# Patient Record
Sex: Female | Born: 1955 | Race: Black or African American | Hispanic: No | Marital: Married | Smoking: Never smoker
Health system: Southern US, Community
[De-identification: ages and names within clinical notes are randomized; demographics above are authoritative.]

## PROBLEM LIST (undated history)

## (undated) DIAGNOSIS — E039 Hypothyroidism, unspecified: Secondary | ICD-10-CM

## (undated) DIAGNOSIS — E785 Hyperlipidemia, unspecified: Secondary | ICD-10-CM

## (undated) DIAGNOSIS — Z8639 Personal history of other endocrine, nutritional and metabolic disease: Secondary | ICD-10-CM

## (undated) DIAGNOSIS — Z923 Personal history of irradiation: Secondary | ICD-10-CM

## (undated) DIAGNOSIS — R51 Headache: Secondary | ICD-10-CM

## (undated) DIAGNOSIS — K219 Gastro-esophageal reflux disease without esophagitis: Secondary | ICD-10-CM

## (undated) DIAGNOSIS — E7800 Pure hypercholesterolemia, unspecified: Secondary | ICD-10-CM

## (undated) DIAGNOSIS — D571 Sickle-cell disease without crisis: Secondary | ICD-10-CM

## (undated) DIAGNOSIS — M1990 Unspecified osteoarthritis, unspecified site: Secondary | ICD-10-CM

## (undated) DIAGNOSIS — T7840XA Allergy, unspecified, initial encounter: Secondary | ICD-10-CM

## (undated) DIAGNOSIS — H409 Unspecified glaucoma: Secondary | ICD-10-CM

## (undated) HISTORY — DX: Sickle-cell disease without crisis: D57.1

## (undated) HISTORY — PX: COLONOSCOPY: SHX174

## (undated) HISTORY — PX: SHOULDER SURGERY: SHX246

## (undated) HISTORY — PX: EYE SURGERY: SHX253

## (undated) HISTORY — PX: ABDOMINAL HYSTERECTOMY: SHX81

## (undated) HISTORY — DX: Allergy, unspecified, initial encounter: T78.40XA

## (undated) HISTORY — DX: Unspecified glaucoma: H40.9

## (undated) HISTORY — DX: Hyperlipidemia, unspecified: E78.5

## (undated) HISTORY — PX: HIP ARTHROPLASTY: SHX981

## (undated) HISTORY — PX: APPENDECTOMY: SHX54

## (undated) HISTORY — PX: OTHER SURGICAL HISTORY: SHX169

## (undated) HISTORY — DX: Unspecified osteoarthritis, unspecified site: M19.90

## (undated) HISTORY — PX: TONSILLECTOMY: SUR1361

## (undated) HISTORY — PX: JOINT REPLACEMENT: SHX530

---

## 1998-04-06 ENCOUNTER — Ambulatory Visit (HOSPITAL_COMMUNITY): Admission: RE | Admit: 1998-04-06 | Discharge: 1998-04-06 | Payer: Self-pay | Admitting: Obstetrics & Gynecology

## 1999-09-20 ENCOUNTER — Other Ambulatory Visit: Admission: RE | Admit: 1999-09-20 | Discharge: 1999-09-20 | Payer: Self-pay | Admitting: Obstetrics and Gynecology

## 1999-10-12 ENCOUNTER — Other Ambulatory Visit: Admission: RE | Admit: 1999-10-12 | Discharge: 1999-10-12 | Payer: Self-pay | Admitting: Obstetrics and Gynecology

## 1999-10-12 ENCOUNTER — Encounter (INDEPENDENT_AMBULATORY_CARE_PROVIDER_SITE_OTHER): Payer: Self-pay

## 1999-11-06 ENCOUNTER — Inpatient Hospital Stay (HOSPITAL_COMMUNITY): Admission: RE | Admit: 1999-11-06 | Discharge: 1999-11-09 | Payer: Self-pay | Admitting: Obstetrics and Gynecology

## 1999-11-06 ENCOUNTER — Encounter (INDEPENDENT_AMBULATORY_CARE_PROVIDER_SITE_OTHER): Payer: Self-pay | Admitting: Specialist

## 2000-07-29 ENCOUNTER — Encounter (INDEPENDENT_AMBULATORY_CARE_PROVIDER_SITE_OTHER): Payer: Self-pay | Admitting: Specialist

## 2000-07-30 ENCOUNTER — Inpatient Hospital Stay (HOSPITAL_COMMUNITY): Admission: EM | Admit: 2000-07-30 | Discharge: 2000-08-01 | Payer: Self-pay | Admitting: Emergency Medicine

## 2000-11-25 ENCOUNTER — Other Ambulatory Visit: Admission: RE | Admit: 2000-11-25 | Discharge: 2000-11-25 | Payer: Self-pay | Admitting: Obstetrics and Gynecology

## 2003-09-30 ENCOUNTER — Other Ambulatory Visit: Admission: RE | Admit: 2003-09-30 | Discharge: 2003-09-30 | Payer: Self-pay | Admitting: Obstetrics and Gynecology

## 2003-10-14 ENCOUNTER — Encounter: Payer: Self-pay | Admitting: Orthopedic Surgery

## 2003-10-19 ENCOUNTER — Inpatient Hospital Stay (HOSPITAL_COMMUNITY): Admission: RE | Admit: 2003-10-19 | Discharge: 2003-10-25 | Payer: Self-pay | Admitting: Orthopedic Surgery

## 2003-10-25 ENCOUNTER — Inpatient Hospital Stay (HOSPITAL_COMMUNITY)
Admission: RE | Admit: 2003-10-25 | Discharge: 2003-10-29 | Payer: Self-pay | Admitting: Physical Medicine & Rehabilitation

## 2003-12-27 ENCOUNTER — Ambulatory Visit (HOSPITAL_COMMUNITY): Admission: RE | Admit: 2003-12-27 | Discharge: 2003-12-27 | Payer: Self-pay | Admitting: Obstetrics and Gynecology

## 2004-01-09 ENCOUNTER — Encounter: Admission: RE | Admit: 2004-01-09 | Discharge: 2004-01-09 | Payer: Self-pay | Admitting: Obstetrics and Gynecology

## 2004-03-11 ENCOUNTER — Ambulatory Visit (HOSPITAL_COMMUNITY): Admission: RE | Admit: 2004-03-11 | Discharge: 2004-03-11 | Payer: Self-pay | Admitting: Emergency Medicine

## 2004-04-10 ENCOUNTER — Inpatient Hospital Stay (HOSPITAL_COMMUNITY): Admission: RE | Admit: 2004-04-10 | Discharge: 2004-04-12 | Payer: Self-pay | Admitting: Obstetrics and Gynecology

## 2004-04-10 ENCOUNTER — Encounter (INDEPENDENT_AMBULATORY_CARE_PROVIDER_SITE_OTHER): Payer: Self-pay | Admitting: Specialist

## 2005-05-06 ENCOUNTER — Encounter (HOSPITAL_COMMUNITY): Admission: RE | Admit: 2005-05-06 | Discharge: 2005-08-04 | Payer: Self-pay | Admitting: Endocrinology

## 2006-02-27 ENCOUNTER — Other Ambulatory Visit: Admission: RE | Admit: 2006-02-27 | Discharge: 2006-02-27 | Payer: Self-pay | Admitting: Obstetrics and Gynecology

## 2006-03-07 ENCOUNTER — Ambulatory Visit (HOSPITAL_COMMUNITY): Admission: RE | Admit: 2006-03-07 | Discharge: 2006-03-07 | Payer: Self-pay | Admitting: Obstetrics and Gynecology

## 2006-03-24 ENCOUNTER — Encounter: Admission: RE | Admit: 2006-03-24 | Discharge: 2006-03-24 | Payer: Self-pay | Admitting: Obstetrics and Gynecology

## 2007-01-23 ENCOUNTER — Ambulatory Visit (HOSPITAL_COMMUNITY): Admission: RE | Admit: 2007-01-23 | Discharge: 2007-01-23 | Payer: Self-pay | Admitting: Orthopedic Surgery

## 2007-03-06 ENCOUNTER — Ambulatory Visit (HOSPITAL_COMMUNITY): Admission: RE | Admit: 2007-03-06 | Discharge: 2007-03-06 | Payer: Self-pay | Admitting: Orthopedic Surgery

## 2008-07-18 ENCOUNTER — Ambulatory Visit (HOSPITAL_BASED_OUTPATIENT_CLINIC_OR_DEPARTMENT_OTHER): Admission: RE | Admit: 2008-07-18 | Discharge: 2008-07-18 | Payer: Self-pay | Admitting: Orthopedic Surgery

## 2010-02-01 ENCOUNTER — Encounter: Payer: Self-pay | Admitting: Sports Medicine

## 2010-03-01 ENCOUNTER — Encounter: Payer: Self-pay | Admitting: Sports Medicine

## 2010-03-02 ENCOUNTER — Ambulatory Visit: Payer: Self-pay | Admitting: Sports Medicine

## 2010-03-02 DIAGNOSIS — G5710 Meralgia paresthetica, unspecified lower limb: Secondary | ICD-10-CM | POA: Insufficient documentation

## 2010-03-02 DIAGNOSIS — M25559 Pain in unspecified hip: Secondary | ICD-10-CM | POA: Insufficient documentation

## 2010-03-05 ENCOUNTER — Encounter: Payer: Self-pay | Admitting: Sports Medicine

## 2010-04-04 ENCOUNTER — Ambulatory Visit: Payer: Self-pay | Admitting: Sports Medicine

## 2010-04-13 ENCOUNTER — Encounter: Admission: RE | Admit: 2010-04-13 | Discharge: 2010-04-13 | Payer: Self-pay | Admitting: Obstetrics and Gynecology

## 2010-05-02 ENCOUNTER — Ambulatory Visit: Payer: Self-pay | Admitting: Sports Medicine

## 2010-05-02 DIAGNOSIS — R269 Unspecified abnormalities of gait and mobility: Secondary | ICD-10-CM | POA: Insufficient documentation

## 2010-05-23 ENCOUNTER — Ambulatory Visit: Payer: Self-pay | Admitting: Family Medicine

## 2010-06-05 ENCOUNTER — Encounter: Admission: RE | Admit: 2010-06-05 | Discharge: 2010-07-18 | Payer: Self-pay | Admitting: Family Medicine

## 2010-06-05 ENCOUNTER — Encounter: Payer: Self-pay | Admitting: Family Medicine

## 2010-07-05 ENCOUNTER — Ambulatory Visit: Payer: Self-pay | Admitting: Sports Medicine

## 2010-07-05 DIAGNOSIS — M545 Low back pain, unspecified: Secondary | ICD-10-CM | POA: Insufficient documentation

## 2010-07-06 ENCOUNTER — Ambulatory Visit (HOSPITAL_COMMUNITY): Admission: RE | Admit: 2010-07-06 | Discharge: 2010-07-06 | Payer: Self-pay | Admitting: Sports Medicine

## 2010-07-18 ENCOUNTER — Encounter: Payer: Self-pay | Admitting: Sports Medicine

## 2010-08-16 ENCOUNTER — Ambulatory Visit: Payer: Self-pay | Admitting: Sports Medicine

## 2010-10-15 ENCOUNTER — Ambulatory Visit: Payer: Self-pay | Admitting: Sports Medicine

## 2010-10-17 ENCOUNTER — Encounter
Admission: RE | Admit: 2010-10-17 | Discharge: 2010-12-22 | Payer: Self-pay | Source: Home / Self Care | Attending: Sports Medicine | Admitting: Sports Medicine

## 2010-10-17 ENCOUNTER — Encounter: Payer: Self-pay | Admitting: Sports Medicine

## 2010-11-02 ENCOUNTER — Encounter: Payer: Self-pay | Admitting: Sports Medicine

## 2010-11-20 ENCOUNTER — Ambulatory Visit: Payer: Self-pay | Admitting: Sports Medicine

## 2010-11-28 ENCOUNTER — Encounter
Admission: RE | Admit: 2010-11-28 | Discharge: 2010-12-22 | Payer: Self-pay | Source: Home / Self Care | Attending: Family Medicine | Admitting: Family Medicine

## 2010-12-22 ENCOUNTER — Encounter: Admission: RE | Admit: 2010-12-22 | Payer: Self-pay | Source: Home / Self Care | Admitting: Family Medicine

## 2010-12-26 ENCOUNTER — Encounter
Admission: RE | Admit: 2010-12-26 | Discharge: 2011-01-01 | Payer: Self-pay | Source: Home / Self Care | Attending: Family Medicine | Admitting: Family Medicine

## 2011-01-07 ENCOUNTER — Encounter: Payer: Self-pay | Admitting: Sports Medicine

## 2011-01-11 ENCOUNTER — Encounter: Payer: Self-pay | Admitting: Sports Medicine

## 2011-01-12 ENCOUNTER — Encounter: Payer: Self-pay | Admitting: Obstetrics and Gynecology

## 2011-01-13 ENCOUNTER — Encounter: Payer: Self-pay | Admitting: Obstetrics and Gynecology

## 2011-01-13 ENCOUNTER — Encounter: Payer: Self-pay | Admitting: Orthopedic Surgery

## 2011-01-17 ENCOUNTER — Ambulatory Visit: Admit: 2011-01-17 | Payer: Self-pay | Admitting: Sports Medicine

## 2011-01-22 NOTE — Assessment & Plan Note (Signed)
Summary: F/U,MC   Vital Signs:  Patient profile:   55 year old female Pulse rate:   71 / minute BP sitting:   119 / 76  (right arm)  Vitals Entered By: Rochele Pages RN (November 20, 2010 11:44 AM) CC: f/u low back pain   CC:  f/u low back pain.  History of Present Illness: 55 yo female here for f/u of back pain Pt states she has had about the same amount of pain as last visit.  Pt states she still has some pain that is worse with standing for >66min and radiates to lateral right thigh.  Does not pass knee, never has given out on her Pt denies any swelling or any bowel or bladder problems.  Pt states same pain seems to be worse with walking too much.  Pt states that the PT she was dioing was helping but pt only had 2 visits but did seem to help.  Pt is continuing HEP.    Pain was improving until long bus trip to [facility] and back  The anterior thigh pain has improved only had a small amount of it with her cooking this weekednd for thanksgiving but neurontin seemed to help and no side effects.   Pt is not taking any other pain meds.   Pt has hx of bilateral hip replacements.   Preventive Screening-Counseling & Management  Alcohol-Tobacco     Smoking Status: never  Current Medications (verified): 1)  Gabapentin 600 Mg Tabs (Gabapentin) .[name].. 1 By Mouth Tid 2)  Simvastatin 40 Mg Tabs (Simvastatin) .[name].. 1 By Mouth Qhs 3)  Synthroid 112 Mcg Tabs (Levothyroxine Sodium) .[name].. 1 By Mouth Qd 4)  Pantoprazole Sodium 40 Mg Tbec (Pantoprazole Sodium) .[name].. 1 By Mouth As Needed 5)  Naproxen 500 Mg Tabs (Naproxen) .[name].. 1 By Mouth Two Times A Day As Directed 6)  Promethazine Hcl 25 Mg Tabs (Promethazine Hcl) .[name].. 1 By Mouth As Needed Nausea 7)  Calcium Carbonate 600 Mg Tabs (Calcium Carbonate) .[name].. 1 By Mouth Bid 8)  Vitamin D 400 Unit Caps (Cholecalciferol) .... Takes Total of 2000 U Qd  Allergies (verified): 1)  ! Ultram  Past History:  Past medical, surgical, family and social histories  (including risk factors) reviewed, and no changes noted (except as noted below).  Past Surgical History: Reviewed history from 05/23/2010 and no changes required. 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  Family History: Reviewed history and no changes required.  Social History: Reviewed history and no changes required. Smoking Status:  never  Review of Systems       denies fever, chills, nausea, vomiting, diarrhea or constipation shortness of breath, Chest pain.   Physical Exam  General:  Well-developed,obest female,in no acute distress; alert,appropriate and cooperative throughout examination Msk:  Low back with intact skin, mild tenderness to the palpation of her lumbar spine. Flexion 100  degrees, extension 30 degrees with no tenderness, full ROM for rotation and lateral motion. Strengh V/V for hip, knees, and ankles. DTR II/IV patellar, and aquilles B/L No sensory Deficit. Gait with lateral swing. neg SLT neg fabers weakness of hip abductors on right side  Pulses:  2+   Impression & Recommendations:  Problem # 1:  BACK PAIN, LUMBAR (ICD-724.2) Assessment Unchanged seems to be more a core instability and weak hip abductors.   Gave pt exercises to do has some mild trochanteric bursitis pt can use NSAIDS as needed for Will send again for PT for  4-6 weeks 2x weekly F/u in 4 weeks  Her updated medication list for this problem includes:    Naproxen 500 Mg Tabs (Naproxen) .[name]... 1 by mouth two times a day as directed  Problem # 2:  HIP PAIN, BILATERAL (ICD-719.45) Assessment: Unchanged as above hx of b/l hip replacements.  Her updated medication list for this problem includes:    Naproxen 500 Mg Tabs (Naproxen) .[name]... 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   Complete Medication List: 1)  Gabapentin 600 Mg Tabs  (Gabapentin) .[name].. 1 by mouth tid 2)  Simvastatin 40 Mg Tabs (Simvastatin) .[name].. 1 by mouth qhs 3)  Synthroid 112 Mcg Tabs (Levothyroxine sodium) .[name].. 1 by mouth qd 4)  Pantoprazole Sodium 40 Mg Tbec (Pantoprazole sodium) .[name].. 1 by mouth as needed 5)  Naproxen 500 Mg Tabs (Naproxen) .[name].. 1 by mouth two times a day as directed 6)  Promethazine Hcl 25 Mg Tabs (Promethazine hcl) .[name].. 1 by mouth as needed nausea 7)  Calcium Carbonate 600 Mg Tabs (Calcium carbonate) .[name].. 1 by mouth bid 8)  Vitamin D 400 Unit Caps (Cholecalciferol) .... Takes total of 2000 u qd   Orders Added: 1)  Est. Patient Level III [45409]

## 2011-01-22 NOTE — Assessment & Plan Note (Signed)
Summary: HIP PAIN,MC   Vital Signs:  Patient profile:   55 year old female Height:      66 inches Weight:      238 pounds BMI:     38.55 BP sitting:   158 / 81  Vitals Entered By: Lillia Pauls CMA (March 02, 2010 10:38 AM)  CC:  groin and thigh pain.  History of Present Illness: 1.  bilateral groin and thigh pain--had bilateral hip replacement in 2004 for ?avascular necrosis of hips after what sounds to be a prolonged period of hyperthyroidism.  Started having groin and thigh pain 3 years ago.  Pain is described as very strong charlie horse.  Is excruciating at times.  She has stayed in bed for as long as a week before.  Episodes of pain typically last for about 3 days.  And she typically had 2-3 per month.  About 1 month ago, she was started on baclofen and advil scheduled and given vicodin for breakthrough pain.  Since starting this regimen, the pain is better.  She has only had one episode and it lasted on 1.5 days.  She uses the vicodin infrequently, < once a week.  She has had normal EMGs X 2, a normal hip MRIs and plain films.  She received a cortisone injection of left hip bursa with little relief.  She was recently found to be vitamin D deficient and started on replacement.    Allergies (verified): 1)  ! Ultram  Review of Systems General:  Denies fever and weight loss. MS:  ocassional low back pain.  no true muscle weakness.  no numbness.  does feel off-balance during and after the pain episodes.  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   Start gabapentin and titrate up as described in patient instructions.  Stop baclofen and advil.  Likely will not need the hydrocodone anymore.  Follow up in 4-6 weeks.    Problem # 2:  HIP PAIN, BILATERAL (ICD-719.45) Although she is s/P THR bilaterally the hips seem to function well strength around hip joint is excellent really does not seem that this is the trigger for her pain  Complete Medication List: 1)  Neurontin 300 Mg Caps (Gabapentin) .... Increase to three times a day as directed.  for pain  Patient Instructions: 1)  It was nice to see you today. 2)  The condition you have is meralgia paresthetica. 3)  Avoid tight clothing. 4)  Start taking the medicine we prescribed you:  gabapentin. 5)  Take 1 tablet in the evening for one week.  Then one tablet in the morning and one in the evening for one week.  Then one tablet three times a day. 6)  STOP the baclofen and the advil. 7)  Water aerobics or a recumbent stationary bike would be great exercises. 8)  Please schedule a follow-up appointment in 4-6 weeks. Prescriptions: NEURONTIN 300 MG CAPS (GABAPENTIN) Increase to three times a day as directed.  For pain  #90 x 3   Entered by:   Asher Muir MD   Authorized by:  Enid Baas MD   Signed by:   Asher Muir MD on 03/02/2010   Method used:   Electronically to        [id number] (retail)       [address]       [place], [place]  [address]       Ph: [phone] or [phone]       Fax: [phone]   RxID:   [id number]

## 2011-01-22 NOTE — Assessment & Plan Note (Signed)
Summary: F/U THIGH,MC   Vital Signs:  Patient profile:   55 year old female Height:      66 inches Weight:      251 pounds Pulse rate:   80 / minute BP sitting:   137 / 82  (right arm)  Vitals Entered By: Rochele Pages RN (October 15, 2010 11:18 AM) CC: f/u rt thight cramping- 50 % improved   CC:  f/u rt thight cramping- 50 % improved.  History of Present Illness: Pt presents to clinic today for follow up of rt thigh cramping which she reports is 50% improved since last visit.  She was diagnosed with meralgia and treated with neurontin 1800 mg daily. Her groin pain has improve significantly. She is complaining today  back pain radiated to her leg on and off, burning sensation in the lateral aspect of both knee around the lateral epicondile for the last week, on and off, walking, bending forward and changes of possition makes the tingling worse, resting makes it better. No numbness, no weakness. No saddle anestesia. She has Hx of B/L hip replacement in 2004. Last F/U last year. Her hips are doing well.  Allergies: 1)  ! Ultram  Physical Exam  General:  Well-developed,well-nourished,in no acute distress; alert,appropriate and cooperative throughout examination Msk:  Low back with intact skin, mild tenderness to the palpation of her lumbar spine. Flexion 100  degrees, extension 30 degrees with tenderness, full ROM for rotation and lateral motion. Strengh V/V for hip, knees, and ankles. DTR II/IV patellar, and aquilles B/L No sensory Deficit. Gait with lateral swing.     Impression & Recommendations:  Problem # 1:  BACK PAIN, LUMBAR (ICD-724.2)  Referred to Physical Therapy for back strengtening, and strecht exercise program  Her updated medication list for this problem includes:    Naproxen 500 Mg Tabs (Naproxen) .[name]... 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 1 by mouth tid 2)  Simvastatin 40 Mg Tabs (Simvastatin) .[name].. 1 by mouth qhs 3)  Synthroid 112 Mcg Tabs (Levothyroxine sodium) .[name].. 1 by mouth qd 4)  Pantoprazole Sodium 40 Mg Tbec (Pantoprazole sodium) .[name].. 1 by mouth as needed 5)  Naproxen 500 Mg Tabs (Naproxen) .[name].. 1 by mouth two times a day as directed 6)  Promethazine Hcl 25 Mg Tabs (Promethazine hcl) .[name].. 1 by mouth as needed nausea 7)  Calcium Carbonate 600 Mg Tabs (Calcium carbonate) .[name].. 1 by mouth bid 8)  Vitamin D 400 Unit Caps (Cholecalciferol) .... Takes total of 2000 u qd   Orders Added: 1)  Est. Patient Level III [16109]     PT referral info sent to [facility] on [address] Rochele Pages RN  October 15, 2010 2:42 PM

## 2011-01-22 NOTE — Assessment & Plan Note (Signed)
Summary: R CRAMPING IN GROIN   Vital Signs:  Patient profile:   55 year old female BP sitting:   145 / 74  Vitals Entered By: Lillia Pauls CMA (May 23, 2010 2:28 PM)  History of Present Illness: Reports to f/u meralgia paresthetica. Symptoms initially improved, but worsened after initiation of zumba classes about 1 week ago. No zumba in the interim. Pain resolving. No change in pain distribution. No back pain, anesthesia, or incontinence.  Hx of bilateral THA performed by Dr. Turner Daniels several years ago. Last seen by Dr. Turner Daniels a few months ago. X-rays and clinical exam suggested normal alignment and placement of surgical hardware without signs of loosening. Instructed to f/u with Dr. Turner Daniels in 1 year.  Allergies: 1)  ! 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  Physical Exam  General:  Well-developed,well-nourished,in no acute distress; alert,appropriate and cooperative throughout examination Msk:  HIPS: 4/5 strength w/easy fatiguability throughout ROM testing. No ttp of hips or groin.  No pain on IR/ER. (-) log roll bilaterally. (-) foot tap bilaterally.  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  -Restart physical therapy given history of bilateral THA for address osteopnenia 2/2 hyperthyroidism. -Will not change current neurontin regimen. -Avoid intense circuit workouts and activities such as zumba -focus more on recumbent biking and waling on track as tolerated. -rtc in 4 wks.  Complete Medication List: 1)  Gabapentin 600 Mg Tabs (Gabapentin) .[name].. 1 by mouth tid

## 2011-01-22 NOTE — Assessment & Plan Note (Signed)
Summary: F/U,MC   Vital Signs:  Patient profile:   55 year old female BP sitting:   133 / 80  Vitals Entered By: Lillia Pauls CMA (April 04, 2010 11:40 AM)  History of Present Illness: Anterior Hip, groin pain and thigh cramps are definitely less describes 50% reduction flare on 4 hour car trip some flares if sitting too much no severe bouts since last seen Before gabapentin would have 3 to 4 severe bouts per month - sometimes would put her on crutches  gabapentin side effects - fell one time with left leg too numb not too drwsy  Allergies: 1)  ! 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 1 by mouth tid  Patient Instructions: 1)  week 1  2)  gabapentin 300 AM/ 300 noon and 600 at night 3)  week 2    600/ 300/ 600 4)  weeks 3 and 4 600 three times a day 5)  if side effects just stay at prior dose and let us know 6)  reck here in 4 weeks 7)  monitor your pain and amount of cramping Prescriptions: GABAPENTIN 600 MG TABS (GABAPENTIN) 1 by mouth tid  #90 x 2   Entered and Authorized by:   Enid Baas MD   Signed by:   Enid Baas MD on 04/04/2010   Method used:   Electronically to        [id number] ([facility]       [address]       [place], [place]  [address]       Ph: [phone] or [phone]       Fax: [phone]   RxID:   [id number]

## 2011-01-22 NOTE — Letter (Signed)
Summary: *Consult Note  [facility]  [address]   [place], [place] [address]   Phone: [phone]  Fax: [phone]    Re:    Veronica Martinez DOB:    Oct 09, 1956 Nadyne Coombes, MD [facility] 03/03/10  Dear [name]:    I had the pleasure of seeing your patient for consultation.  A copy of the detailed office note will be sent under separate cover, for your review.  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  For pain   Thank you for this consultation.  If you have any further questions regarding the care of this patient, please do not hesitate to contact me @ [phone].  Thank you for this opportunity to look after your patient.  Sincerely,  Vincent Gros MD

## 2011-01-22 NOTE — Progress Notes (Signed)
Summary: [facility] PT referral  [facility] referral   Imported By: Marily Memos 11/20/2010 13:36:31  _____________________________________________________________________  External Attachment:    Type:   Image     Comment:   External Document

## 2011-01-22 NOTE — Letter (Signed)
Summary: [facility]  [facility]   Imported By: Knox Royalty 04/11/2010 11:37:32  _____________________________________________________________________  External Attachment:    Type:   Image     Comment:   External Document

## 2011-01-22 NOTE — Miscellaneous (Signed)
Summary: [facility]   Imported By: Marily Memos 03/20/2010 16:13:50  _____________________________________________________________________  External Attachment:    Type:   Image     Comment:   External Document

## 2011-01-22 NOTE — Assessment & Plan Note (Signed)
Summary: FU THIGH PAIN/MJD   Vital Signs:  Patient profile:   55 year old female BP sitting:   108 / 69  Vitals Entered By: Veronica Martinez CMA (August 16, 2010 1:43 PM)  History of Present Illness: Veronica Martinez is a 55 year old female who presents today in follow up for right thigh cramping and a recent exacerbation of her meralgia parasthetica. She reports that she is doing qutie well with both problems. She has not had a thigh cramp in over a month. She has been drinking more water than usual, eating bananans and drinking gatorade occasionally (not daily, as directed, because she noted the sodium content). She is also doing some quadricpes strethcing and strengthening exercises, per PT which seems to help as well.  The patient has also noted an improvement in her meralgia parasthetica. Her symptoms tend to come from the low back/SI joint region and wrap around to her anterior thigh, stopping above the knee. She continues to take neurontin 600mg  three times a day and to do her PT exercises at home as she has finished her formal physical therapy. She is also walking 45 minutes twice daily and would like to be able to do some more vigerous exercise as she is trying to lose weight, but also wants to be cautious since her symptoms were aggrivated when she started zumba.  Allergies: 1)  ! Ultram  Physical Exam  General:  Well-developed,obest female,in no acute distress; alert,appropriate and cooperative throughout examination Msk:  Back: Normal range of motion with some limitation in extension, but no pain. No tenderness to palpation of spine, paraspinal muscles, SI area. Negative log roll bilaterally straight leg raise. Negative FABER, but some tightness on the right.   5/5 hip flexion and ABduction, knee flexion and extension  Hip rotation is 60 degrees (30deg internal rotation, 30 deg external rotation) bilaterally. Neurologic:  Patellar reflexes 1+ and symmetrical. Sensation to light touch  intact throughout both legs.   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 She will continue this regimen and will look for an aerobic exercise class that does not involve significant hip flexion  to avoid exacerbating this problem while allowing her to exercise and try to lose weight. She is quite strong in the hip, so is not likely to be limited by weakness. She is to return to clinic in three months, sooner if needed, to ensure that she has found something that works for her.  Problem # 2:  ABNORMALITY OF GAIT (ICD-781.2) Veronica Martinez has done well with the medial heel wedges in her athletic shoes to prevent her excessive pronation. We will add some of these to her dress shoes to provide additional comfort when she cannot wear the athletic shoes.  Complete Medication List: 1)  Gabapentin 600 Mg Tabs (Gabapentin) .[name].. 1 by mouth tid 2)  Simvastatin 40 Mg Tabs (Simvastatin) .[name].. 1 by mouth qhs 3)  Synthroid 112 Mcg Tabs (Levothyroxine sodium) .[name].. 1 by mouth qd 4)  Pantoprazole Sodium 40 Mg Tbec (Pantoprazole sodium) .[name].. 1 by mouth as needed 5)  Naproxen 500 Mg Tabs (Naproxen) .[name].. 1 by mouth two times a day as directed 6)  Promethazine Hcl 25 Mg Tabs (Promethazine hcl) .[name].. 1 by mouth as needed nausea 7)  Calcium Carbonate 600 Mg Tabs (Calcium carbonate) .[name].. 1 by mouth bid 8)  Vitamin D 400 Unit  Caps (Cholecalciferol) .... Takes total of 2000 u qd

## 2011-01-22 NOTE — Consult Note (Signed)
[facility]   Imported By: Abundio Miu 11/29/2010 09:57:41  _____________________________________________________________________  External Attachment:    Type:   Image     Comment:   External Document

## 2011-01-22 NOTE — Miscellaneous (Signed)
Summary: [facility]   Imported By: Marily Memos 04/05/2010 10:32:40  _____________________________________________________________________  External Attachment:    Type:   Image     Comment:   External Document

## 2011-01-22 NOTE — Assessment & Plan Note (Signed)
Summary: FU THIGH CRAMPS/MJD   Vital Signs:  Patient profile:   55 year old female BP sitting:   123 / 75  Vitals Entered By: Lillia Pauls CMA (July 05, 2010 10:34 AM)  History of Present Illness: pt here today to f/u right hip pain which she says is getting better with PT.  has 2 more sessions left with PT.  she sts she is still having some cramping that radiates down to her knee.   While pattern in past was more ant thigh and groin now some pain starts at [facility] Joint area radiates into buttocks and thigh tingling sometimes to knee has soem thigh cramps admits to sweating more in heat  worse if she is too active often has LBP if she does too much  Allergies: 1)  ! Ultram  Physical Exam  General:  Well-developed,well-nourished,in no acute distress; alert,appropriate and cooperative throughout examination Msk:  Hip ROM is normal bilat on RT she feels some tightness  SLR on RT causes some pain into buttocks at 30 deg SLR on left is neg TTP over RT SIJ Tight over dep buottocks   Impression & Recommendations:  Problem # 1:  BACK PAIN, LUMBAR (ICD-724.2)  Her updated medication list for this problem includes:    Naproxen 500 Mg Tabs (Naproxen) .[name]... 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 whether Zumba class caused this to flare or whether new sxs are referred f back  keep some motion exercises for back and hips  Complete Medication List: 1)  Gabapentin 600 Mg Tabs (Gabapentin) .[name].. 1 by mouth tid 2)  Simvastatin 40 Mg Tabs (Simvastatin) .[name].. 1 by mouth qhs 3)  Synthroid 112 Mcg Tabs (Levothyroxine sodium) .[name].. 1 by mouth qd 4)  Pantoprazole Sodium 40 Mg Tbec (Pantoprazole sodium) .[name].. 1  by mouth as needed 5)  Naproxen 500 Mg Tabs (Naproxen) .[name].. 1 by mouth two times a day as directed 6)  Promethazine Hcl 25 Mg Tabs (Promethazine hcl) .[name].. 1 by mouth as needed nausea 7)  Calcium Carbonate 600 Mg Tabs (Calcium carbonate) .[name].. 1 by mouth bid 8)  Vitamin D 400 Unit Caps (Cholecalciferol) .... Takes total of 2000 u qd  Patient Instructions: 1)  drink 1 gatorade regular bottle per day 2)  take 1 naprosyn 500 per day and take 2 if back is too painful 3)  use pantorparazole or something like zantac or tagamet if stomach irritation 4)  let's xray your low back 5)  keep up gabapentin at 600 three times a day 6)  call you w xray results

## 2011-01-22 NOTE — Miscellaneous (Signed)
Summary: Rehab Report  Rehab Report   Imported By: Marily Memos 06/13/2010 10:57:54  _____________________________________________________________________  External Attachment:    Type:   Image     Comment:   External Document

## 2011-01-22 NOTE — Assessment & Plan Note (Signed)
Summary: FU HIP PAIN/MJD   History of Present Illness: Really had sone well with no cramps on 600 three times a day of gabapentin for 2 wks Then wore very high heels for most of one day had some calf pain that night cramps and tingling in both ant thighs but worse on RT  no real cramps since then but stil some tingling on RT also more sensitive to touch upper rt thigh  Allergies: 1)  ! Ultram  Physical Exam  General:  obese,well-nourished,in no acute distress; alert,appropriate and cooperative throughout examination Msk:  Full ROM both hips good hip abduction, flex and rotation strength RT and LT however, now hip flexion cause some tightness in Ant RT thigh + tinels w pressur over femoral triangle on RT/ not on left tingling over adductors of rt thigh to palpation  walking gait is abnormal w genu valgus that increase dynamically has some loss of long arch bilat   Impression & Recommendations:  Problem # 1:  HIP PAIN, BILATERAL (ICD-719.45) generally much better until last week  would cont gabapentin at 600 three times a day x 3 mos  Problem # 2:  MERALGIA PARESTHETICA (ICD-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 1 by mouth tid

## 2011-01-22 NOTE — Miscellaneous (Signed)
Summary: [facility]  [facility]   Imported By: Marily Memos 10/19/2010 09:06:33  _____________________________________________________________________  External Attachment:    Type:   Image     Comment:   External Document

## 2011-01-22 NOTE — Letter (Signed)
Summary: [facility] PT Referral form  [facility] PT Referral form   Imported By: Marily Memos 05/23/2010 16:15:42  _____________________________________________________________________  External Attachment:    Type:   Image     Comment:   External Document

## 2011-01-22 NOTE — Miscellaneous (Signed)
[facility]   Imported By: Marily Memos 11/07/2010 09:27:32  _____________________________________________________________________  External Attachment:    Type:   Image     Comment:   External Document

## 2011-01-22 NOTE — Miscellaneous (Signed)
Summary: [facility]  [facility]   Imported By: Marily Memos 07/24/2010 13:29:56  _____________________________________________________________________  External Attachment:    Type:   Image     Comment:   External Document

## 2011-01-24 NOTE — Letter (Signed)
Summary: [facility]   Imported By: Marily Memos 01/14/2011 15:07:28  _____________________________________________________________________  External Attachment:    Type:   Image     Comment:   External Document

## 2011-01-24 NOTE — Miscellaneous (Signed)
[facility]  [facility]   Imported By: Marily Memos 01/08/2011 09:48:58  _____________________________________________________________________  External Attachment:    Type:   Image     Comment:   External Document

## 2011-02-11 ENCOUNTER — Ambulatory Visit: Payer: Self-pay | Admitting: Sports Medicine

## 2011-02-20 ENCOUNTER — Encounter: Payer: Self-pay | Admitting: Sports Medicine

## 2011-02-20 ENCOUNTER — Ambulatory Visit (INDEPENDENT_AMBULATORY_CARE_PROVIDER_SITE_OTHER): Payer: Medicare Other | Admitting: Sports Medicine

## 2011-02-20 DIAGNOSIS — E669 Obesity, unspecified: Secondary | ICD-10-CM

## 2011-02-20 DIAGNOSIS — G5710 Meralgia paresthetica, unspecified lower limb: Secondary | ICD-10-CM

## 2011-02-20 DIAGNOSIS — M545 Low back pain, unspecified: Secondary | ICD-10-CM

## 2011-02-20 DIAGNOSIS — M25559 Pain in unspecified hip: Secondary | ICD-10-CM

## 2011-02-28 NOTE — Assessment & Plan Note (Signed)
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  Gabapentin - helps relieve pain/cramping -Cramps are better  Tight clothing, sitting in uncomfortable chair makes worse  TENS unit for back bain and PT have helped  Back pain: -15 years ago - worked in [employer] -Injury - pulled muscle in back -Lubar pain persists -Numbness in low back  I wonder if this is triggerd in part 2/2 to changes in stance and gait from hip replacements  Allergies: 1)  ! 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 1 by mouth two times a day as directed  This is also improved with PT exercises  Problem # 3:  BACK PAIN, LUMBAR (ICD-724.2)  Her updated medication list for this problem includes:    Naproxen 500 Mg Tabs (Naproxen) .[name]... 1 by mouth two times a day as directed  wonder if this is more SI joint  cont TENS unit as this has helped  will cosider injection if worse  Problem # 4:  OBESITY, UNSPECIFIED  (ICD-278.00) clearly a factor in back and mer paresth  will cont to push aerobic exercise and attempts at weight loss  Complete Medication List: 1)  Gabapentin 600 Mg Tabs (Gabapentin) .[name].. 1 by mouth tid 2)  Simvastatin 40 Mg Tabs (Simvastatin) .[name].. 1 by mouth qhs 3)  Synthroid 112 Mcg Tabs (Levothyroxine sodium) .[name].. 1 by mouth qd 4)  Pantoprazole Sodium 40 Mg Tbec (Pantoprazole sodium) .[name].. 1 by mouth as needed 5)  Naproxen 500 Mg Tabs (Naproxen) .[name].. 1 by mouth two times a day as directed 6)  Promethazine Hcl 25 Mg Tabs (Promethazine hcl) .[name].. 1 by mouth as needed nausea 7)  Calcium Carbonate 600 Mg Tabs (Calcium carbonate) .[name].. 1 by mouth bid 8)  Vitamin D 400 Unit Caps (Cholecalciferol) .... Takes total of 2000 u qd   Orders Added: 1)  Est. Patient Level IV [45409]

## 2011-05-07 NOTE — Op Note (Signed)
Veronica Martinez, Veronica Martinez               ACCOUNT NO.:  [id number]   MEDICAL RECORD NO.:  [id number]          PATIENT TYPE:  AMB   LOCATION:  DSC                          FACILITY:  [facility]   PHYSICIAN:  Feliberto Gottron. Turner Daniels, M.D.   DATE OF BIRTH:  1956-07-05   DATE OF PROCEDURE:  07/18/2008  DATE OF DISCHARGE:                               OPERATIVE REPORT   PREOPERATIVE DIAGNOSIS:  Right shoulder impingement syndrome, possible  rotator cuff tear with acromioclavicular joint arthritis.   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   PROCEDURES:  Right shoulder arthroscopic anterior-inferior  acromioplasty, distal clavicle excision and debridement of split tear of  the fibers of the supraspinatus as an approached insertion on the  greater tuberosity.   SURGEON:  Feliberto Gottron. Turner Daniels, MD   FIRST ASSISTANT:  Shirl Harris PA-C   ANESTHETIC:  Right interscalene block with general endotracheal.   ESTIMATED BLOOD LOSS:  Minimal.   FLUID REPLACEMENT:  A liter of crystalloid.   DRAINS PLACED:  None.   TOURNIQUET TIME:  None.   INDICATIONS FOR PROCEDURE:  A 55 year old woman well-known to me having  had a couple of hip replacements in the past and on full disability.  She saw me back in I believe January or February with right shoulder  pain, had a cortisone injection in March, that lasted for a month or two  or often has continued pain in her right shoulder, this is consistent  with impingement syndrome.  She also has a positive cross chest test for  [facility] joint arthritis and has an x-ray very consistent with AC joint  arthritis and a type 2 to type 3 subacromial spur.  Her rotator cuff  interval between the acromion and humeral head is normal and her cuff  power is grossly symmetric with the contralateral side.  She desires  elective  arthroscopic decompression, distal clavicle excision, and  debridement or repair of any rotator cuff pathology or labral pathology.  Risks and benefits of surgery well-known to the patient and questions  have been answered.   DESCRIPTION OF PROCEDURE:  In the block area today surgery center, she  underwent right shoulder interscalene block anesthetic.  She received IV  antibiotics and was taken to operating room 2 where the appropriate  anesthetic monitors were attached and general endotracheal anesthesia  induced with the patient in a supine position.  She was then placed in  the beach chair position and the right upper extremity prepped and  draped in usual sterile fashion from the wrist to the hemithorax.  A  time-out procedure was then performed and we began the operation itself  by making standard portals 1.5 cm anterior to the [facility] joint lateral to  the junction of middle and posterior thirds of the acromion and  posterior of the posterolateral corner of the acromion process.  The  inflow was placed anteriorly, the arthroscope laterally and a 4.2 great  white sucker shaver posteriorly.  Diagnostic arthroscopy revealed  a very  prominent subclavicular and subacromial spurs, bone-on-bone changes to  the [facility] joint and a longitudinal split in the fibers of the supraspinatus  as it approached the greater tuberosity.  The fibers themselves were in  continuity and there were no bare spots on the tuberosity.  Using a 4.5  hooded Vortex bur, we then removed the subclavicular and subacromial  spurs creating a type 1 subacromial arch.  We also lightly debrided some  fibers of the supraspinatus to confirm that it was simply a split in the  fibers longitudinal.  We then switched portals bringing the scope in  posteriorly, the inflow laterally and a 4.5 hooded Vortex bur anteriorly  completing the distal clavicle excision and creating about a 1 cm gap at  the end of the distal clavicle.  The  arthroscope was then repositioned  into the glenohumeral joint using the posterior portal where we  documented only grade 1 chondromalacia of the glenohumeral joint, very  mild degenerative tearing of the labrum, the biceps anchor was intact  and internally we could see the longitudinal split in the fibers of the  supraspinatus.  These did not require any debridement.  The  infraspinatus was intact as was the subscapularis and the teres minor.  At this point, the shoulder was irrigated out with normal saline  solution.  The arthroscopic instruments were removed and a dressing of  Xeroform, 4x4 dressing, sponges, paper tape, and a sling applied.  The  patient was laid supine, awakened and taken to the recovery room without  difficulty.      Feliberto Gottron. Turner Daniels, M.D.  Electronically Signed     FJR/MEDQ  D:  07/18/2008  T:  07/19/2008  Job:  [id number]

## 2011-05-10 NOTE — Discharge Summary (Signed)
NAME:  Veronica Martinez, Veronica Martinez                         ACCOUNT NO.:  [id number]   MEDICAL RECORD NO.:  [id number]                   PATIENT TYPE:  IPS   LOCATION:  4148                                 FACILITY:  [facility]   PHYSICIAN:  Erick Colace, M.D.           DATE OF BIRTH:  [birth year]   DATE OF ADMISSION:  10/25/2003  DATE OF DISCHARGE:  10/29/2003                                 DISCHARGE SUMMARY   DISCHARGE DIAGNOSES:  1. 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  2. Deep venous thrombosis prophylaxis.  3. History of hyperthyroidism.  4. Anemia.   HISTORY OF PRESENT ILLNESS:  The patient is a 55 year old black female with  past medical history of hyperthyroidism, bilateral hip pain which failed  conservative care, admitted on October 24, 2003, for bilateral total hip  replacement secondary to AVN with collapse on right and impending collapse  on left by Feliberto Gottron. Turner Daniels, M.D.  The patient was placed on Coumadin for DVT  prophylaxis.  Postoperative complications include anemia, status post  transfusion, and constipation.  PT report at this time revealed the patient  is weightbearing as tolerated, ambulating to 50 feet with rolling walker,  minimal assistance with transfer minimal assistance.  Hospital course also  significant for bilateral knee pain.  The patient is transferred to [facility] Department on October 25, 2003.   PAST MEDICAL HISTORY:  As above.   PAST SURGICAL HISTORY:  1. Hysterectomy.  2. Appendectomy.  3. Hemorrhoidectomy.   PRIMARY CARE PHYSICIAN:  Dr. Kathrynn Humble in [place].   MEDICATIONS ON ADMISSION:  1. Iron.  2. Darvocet.  3. Allegra.  4. Methimazole.   ALLERGIES:  Shrimp and ULTRAM.   SOCIAL HISTORY:  The patient is married, lives in a split-level home with  three step to entry, independent prior to admission, has one step inside,  ambulating with crutches prior to admission.  Husband works during the  day.  She has a 52 year old daughter that lives with her.  Denies any tobacco or  alcohol use.   REVIEW OF SYSTEMS:  Joint pain and migraines.  Denies any chest pain,  shortness of breath, or nausea and vomiting.   FAMILY HISTORY:  Noncontributory.   HOSPITAL COURSE:  Ms. Georgenia Salim was admitted to [facility] Department on October 25, 2003, for comprehensive  rehabilitation.  She received more than three hours of therapy daily.  Overall, Ms. Westerfeld made great progress, especially with having bilateral  total hip arthroplasty.  The patient remained on Coumadin for DVT  prophylaxis.  The patient was placed on Lovenox until Coumadin was  therapeutic.  Once Coumadin was therapeutic, Lovenox was discontinued.  There were no significant bleeding complications noted.  The patient did  have an admission hemoglobin of 9.3 and hematocrit 27.5.  She remained on  Trinsicon one tablet p.o. b.i.d. throughout her stay in rehabilitation.  The  patient was  also placed on Protonix 40 mg daily for GI prophylaxis.  The  patient's pain had been controlled with Robaxin as well as oxycodone and  Celebrex.  No adjustments were necessary in the pain medication.  Overall,  the patient made great progress during rehabilitation.  Discharged at  modified independent level.  The patient was able to ambulate greater than  100 feet with a rolling walker.  There were no other major issues that  occurred while the patient was rehabilitation.   LABORATORY DATA:  Latest hemoglobin was 9.8, hematocrit 29.1, white blood  cell count 7.5, platelet count 441.  One hemoccult performed on stool was  negative.  Latest INR performed on October 29, 2003, was 1.7.  Latest sodium  137, potassium 3.8, chloride 102, CO2 29, glucose 97, BUN 7, creatinine 0.6.  AST 23, ALT 27.  Urine culture performed on October 25, 2003, with 9000  colonies, insignificant growth.   PHYSICAL EXAMINATION:  At the time of discharge,  both surgical incisions  well healed.  No drainage noted.  Staples were still intact.  PT report at  the time of discharge indicated the patient was able to ambulate  approximately 200 feet with rolling walker, modified independent, able to  transfer sit to stand modified independent, bed mobility modified  independent.  She was able to perform all ADLs at supervision modified  independent level.  The patient is able to observe her hip precautions.  The  patient is discharged home with her husband.  At the time of discharge, all  vital signs were stable.   DISCHARGE MEDICATIONS:  1. Tapazole 5 mg daily.  2. Celebrex 200 mg twice daily.  3. Trinsicon one tablet twice daily.  4. Os-Cal plus B over the counter daily.  5. Coumadin 6 mg in p.m. until November 11, then stop.  6. Robaxin 500 mg one to two tablets every six to eight hours as needed for     spasms.  7. Oxycodone 5-10 mg one to two tablets every four to six hours as needed     for pain.  8. Allegra daily.  9. No aspirin, ibuprofen, or Aleve while on Coumadin.   DISCHARGE INSTRUCTIONS:  Pain management includes oxycodone, Tylenol.  She  is to use the walker, observe no driving, no taking alcohol, observe hip  precautions, no smoking.  Staples removed at the end of next week by at Dr.  Feliberto Gottron. Rowan's office.  She is to have [facility] for PT,  OT, and a nurse.  Nurse will monitor  the PT and INR for the Coumadin and first draw will be November 8.  She is  follow up with Feliberto Gottron. Turner Daniels, M.D., next week, call for appointment.  Call  Dr. Kathrynn Humble with call for appointment to monitor hemoglobin.  Follow up  with Erick Colace, M.D., as needed.      Junie Bame, P.A.                       Erick Colace, M.D.    LH/MEDQ  D:  11/22/2003  T:  11/22/2003  Job:  [id number]   cc:   Feliberto Gottron. Turner Daniels, M.D.  [address]  [place]  [place] [address]  Fax: [phone]  Kathrynn Humble, M.D.

## 2011-05-10 NOTE — Op Note (Signed)
NAME:  Veronica Martinez, Veronica Martinez                         ACCOUNT NO.:  [id number]   MEDICAL RECORD NO.:  [id number]                   PATIENT TYPE:  INP   LOCATION:  9311                                 FACILITY:  [facility]   PHYSICIAN:  Hal Morales, M.D.             DATE OF BIRTH:  08/03/1956   DATE OF PROCEDURE:  04/10/2004  DATE OF DISCHARGE:                                 OPERATIVE REPORT   PREOPERATIVE DIAGNOSES:  1. Pelvic pain.  2. Complex pelvic mass on ultrasound and CT scan, status post total     abdominal hysterectomy.  3. Hyperthyroidism.   POSTOPERATIVE DIAGNOSES:  1. Severe pelvic pain.  2. Status post hysterectomy.  3. Endometriosis.  4. Extensive pelvic adhesions.  5. Hypothyroidism.   OPERATION:  Exploratory laparotomy, bilateral salpingo-oophorectomy, lysis  of adhesions, bilateral ureterolysis, repair of sigmoid colon serosa.   SURGEON:  Hal Morales, M.D.   FIRST ASSISTANT:  Elmira J. Powell, P.A.C.   INTRAOPERATIVE CONSULTANT:  Leonie Man, M.D.   FINDINGS:  The patient was status post total abdominal hysterectomy.  The  right ovary was densely adherent to the right pelvic sidewall and to the  sigmoid colon.  There were several cysts on the ovary with one approximately  4 cm being filled with clear fluid. There was likewise an endometrioma that  was densely adherent to the right pelvic sidewall and to the sigmoid colon.  The left ovary was normal in size without evidence of cystic enlargement,  however, it was densely adherent to the left pelvic sidewall.  The tubes  were without specific pathology.  There was no further complex mass noted in  the pelvis or in the abdomen.   DESCRIPTION OF PROCEDURE:  The patient was taken to the operating room after  appropriate identification and placed on the operating table.  After the  attainment of adequate general anesthesia, the abdomen, perineum and vagina  were prepped with multiple layers of Betadine and a  Foley catheter inserted  into the bladder and connected to straight drainage.  The abdomen was draped  as a sterile field.  The suprapubic region was infiltrated with  approximately 15 mL of 0.25% Marcaine and a transverse incision made at the  site of the previous abdominal incision.  The abdomen was opened in layers  and pelvic washings and subdiaphragmatic washings obtained initially but  later discarded.  The self-retaining abdominal retractor was placed into the  peritoneal cavity after inspection and evaluation of the pelvis and upper  abdomen.  The bowel was packed cephalad and it was released from adhesions  sharply.  The left ovary was identified and elevated into the operating  field.  A combination of blunt and sharp dissection allowed the ovary to be  elevated.  The retroperitoneal space was accessed and the left ureter  identified, then followed along its course until the left ovary could be  dissected off the  course of the ureter.  The infundibulopelvic ligament was  then isolated, clamped, cut and tied with free tie, then suture ligated with  suture of 0 Vicryl.  The dense adhesions between the ovary and left pelvic  sidewall were lysed with a combination of blunt and sharp dissection and  hemostasis achieved with free ties.  Once the ovary had been freed up from  its adhesive areas, it could be removed from the operative field.  The right  ovary was then identified and with a combination of blunt and sharp  dissection, the tip of the ovary that contained the endometrioma was  dissected off the right pelvic sidewall.  Dissection off the sigmoid was  begun, however, because of the dense adhesions between the two, there were  no clear planes and the integrity of the bowel serosa was compromised.  At  that time, a general surgical consultation was obtained with Dr. Leonie Man, who completed a dissection of the ovary off the sigmoid colon and  repaired the damaged serosa.   At no time was the lumen of the sigmoid noted  to have been injured.  Completion of the dissection of the right ovary off  the pelvic sidewall and sigmoid colon allowed for isolation of the  infundibulopelvic ligament.  The retroperitoneal space was entered and the  ureter identified and separated from the infundibulopelvic ligament to allow  clamping, setting, tying with a free tie, and suture ligation of the  infundibulopelvic ligament.  The remainder of the ovary was dissected off  the pelvic sidewall with a combination of blunt and sharp dissection and  hemostasis achieved with several ties of 0 Vicryl.  Once the bowel serosa  had been repaired by Dr. Lurene Shadow, which will be dictated under a separate  operative report, copious irrigation was carried out and hemostasis noted to  be adequate.  All instruments were then removed from peritoneal cavity and  the abdominal peritoneum closed with a running suture of 2-0 Vicryl.  Rectus  muscles were copious irrigation and made hemostatic with Bovie cautery and  suture.  the rectus fascia was closed with running sutures from each apex to  the midline and tying each suture in the midline.  The subcutaneous tissue  was copiously irrigated and made hemostatic with Bovie cautery and skin  staples were applied to the skin incision after infiltration of the  subcutaneous tissue with another 15 mL of 0.25% Marcaine.  A sterile  pressure dressing was applied and the patient taken from the operating room  to the recovery room in satisfactory condition having tolerated the  procedure well with sponge and instrument counts correct.   SPECIMENS TO PATHOLOGY:  Right and left tube and ovary containing  endometrioma.                                               Hal Morales, M.D.    VPH/MEDQ  D:  04/10/2004  T:  04/11/2004  Job:  [id number]

## 2011-05-10 NOTE — Discharge Summary (Signed)
NAME:  Veronica Martinez, Veronica Martinez                         ACCOUNT NO.:  [id number]   MEDICAL RECORD NO.:  [id number]                   PATIENT TYPE:  INP   LOCATION:  9311                                 FACILITY:  [facility]   PHYSICIAN:  Hal Morales, M.D.             DATE OF BIRTH:  July 31, 1956   DATE OF ADMISSION:  04/10/2004  DATE OF DISCHARGE:  04/12/2004                                 DISCHARGE SUMMARY   DISCHARGE DIAGNOSES:  1. Severe pelvic pain.  2. Endometriosis.  3. Pelvic adhesions.  4. Status post hysterectomy.  5. Hyperthyroidism.   OPERATION:  On the date of admission the patient underwent exploratory  laparotomy with bilateral salpingo-oophorectomy, lysis of adhesions,  bilateral ureterolysis, and repair of bowel serosa (sigmoid colon),  tolerating all procedures well.  The patient was found to have dense pelvic  adhesions plastering her ovaries to the pelvic side walls and her right  ovary to the sigmoid colon.  A right ovarian endometrioma was observed along  with normal-appearing left ovary and bilateral tubes.  The patient's ureters  were functioning bilaterally and, as previously mentioned, she was status  post hysterectomy.   HISTORY OF PRESENT ILLNESS:  Veronica Martinez is a 55 year old married African-  American female para 3-0-0-3 who is status post total abdominal hysterectomy  who presents for exploratory laparotomy with a left oophorectomy because of  severe pelvic pain and a complex left ovarian mass.  Please see the  patient's dictated history and physical examination for details.   PREOPERATIVE PHYSICAL EXAMINATION:  VITAL SIGNS:  Blood pressure 130/76,  weight 188, height 5 feet 4 inches tall.  GENERAL:  Within normal limits.  PELVIC:  EG/BUS was within normal limits.  The vagina is rugous.  Cervix and  uterus are surgically absent.  Adnexa with bilateral tenderness, right  greater than left.  Rectovaginal exam with tenderness but no palpable  masses.   HOSPITAL COURSE:  On the date of admission the patient underwent  aforementioned procedures, tolerating them all well.  Postoperative course  was unremarkable with the patient resuming bowel and bladder function by  postoperative day #2 and therefore deemed ready for discharge home.  Postoperative hemoglobin was 10.6 (preoperative hemoglobin 13.9).   DISCHARGE MEDICATIONS:  1. Tylox one tablet q.4-6h. as needed for pain.  2. Phenergan 25 mg one tablet q.6h. as needed for nausea.  3. Ibuprofen 600 mg one tablet with food q.6h. for 3 days then as needed for     pain.  4. Iron one tablet twice daily for 6 weeks.  5. Colace 100 mg one tablet twice daily until bowel movements are regular.  6. The patient was advised to resume her prehospital medications but     cautioned not to take her Celebrex while she was taking ibuprofen.   FOLLOW-UP:  The patient is scheduled for staple removal at [facility] on April 17, 2004 at 9  a.m.  She has a 6 weeks postoperative visit  with Dr. Pennie Martinez on May 22, 2004 at 3 p.m.   DISCHARGE INSTRUCTIONS:  The patient was given a copy of [facility] postoperative instruction sheet.  She was further advised to avoid  driving for 2 weeks, heavy lifting for 4 weeks, and intercourse for 6 weeks.  The patient's diet was without restriction.   Final pathology was not available at the time of the patient's discharge.     Veronica Martinez.                    Hal Morales, M.D.    EJP/MEDQ  D:  04/12/2004  T:  04/13/2004  Job:  [id number]

## 2011-05-10 NOTE — Op Note (Signed)
[facility]  Patient:    Veronica Martinez                       MRN: [id number] Proc. Date: 11/06/99 Adm. Date:  [id number] Attending:  Dierdre Forth Pearline                           Operative Report  PREOPERATIVE DIAGNOSES:       Uterine fibroids, pelvic pain, dyspareunia.  POSTOPERATIVE DIAGNOSES:      Uterine fibroids, pelvic pain, dyspareunia.  OPERATION:                    Total abdominal hysterectomy and lysis of adhesions.  SURGEON:                      Vanessa P. Pennie Rushing, M.D.  ANESTHESIA:                   General orotracheal.  ESTIMATED BLOOD LOSS:         200 cc.  COMPLICATIONS:                None.  FINDINGS:                     The uterus was enlarged to approximately 12-week ize with a large posterior myoma.  The uterus was held posteriorly by filmy peritoneal adhesions.  The right ovary was within normal limits.  The left ovary contained a 1 cm cyst which ruptured on entry of the abdomen.  The tubes were status post tubal interruption for sterilization.  DESCRIPTION OF PROCEDURE:     The patient was taken to the operating room after  appropriate identification and placed on the operating table.  After the attainment of adequate general anesthesia, the abdomen, perineum and vagina were prepped as multiple layers with Betadine.  A Foley catheter was inserted into the bladder nd connected to straight drainage.  The abdomen was draped as a sterile field.  A transverse incision was made in the abdomen and the abdomen opened in layers. he peritoneum was entered and a self-retaining OConnor-OSullivan retractor placed n the abdominal incision and the bowel packed cephalad.  The uterus was grasped at each cornual region with [facility] clamps and elevated into the operative field. The left round ligament was then suture-ligated and the round ligament incised. That incision was taken on the anterior leaf of the broad ligament  towards the bladder. The utero-ovarian ligament was then clamped, cut, tied with a free tie and suture-ligated.  A similar procedure was carried out on the opposite side with he round ligament, anterior leaf of the broad ligament and the utero-ovarian ligament. The bladder was then bluntly dissected off the anterior cervix.  The uterine arteries on the right and left sides were clamped, cut and suture ligated.  The  parametrial tissues, paracervical tissues, uterosacral angles and vaginal angles were all successively clamped and cut, then suture-ligated on the right and left sides.  The sutures holding the uterosacral ligaments and the vaginal angles were held and the uterus and cervix excised from the upper vagina.  The vaginal cuff was closed with figure-of-eight sutures and hemostasis was noted to be adequate. All sutures used to this point are 0 Vicryl.  Copious irrigation was carried out and hemostasis noted to be adequate.  All instruments were then removed from the abdominal  cavity.  The abdominal peritoneum was closed with a running suture of 3-0 Vicryl.  The rectus muscle was made hemostatic with Bovie cautery and the rectus fascia closed with running sutures of 0 Vicryl from each apex to the midline and tied in the midline.  Hemostasis was achieved in the fascia with a figure-of-eight suture of 0 Vicryl.  The subcutaneous tissue was copiously irrigated and made hemostatic with Bovie cautery.  Skin staples were applied to the skin incision. A sterile dressing was applied and the patient was awakened from general anesthesia and taken to the recovery room in satisfactory condition having tolerated the procedure well with sponge and instrument counts correct.  SPECIMENS TO PATHOLOGY:  Uterus and cervix. DD:  11/06/99 TD:  11/07/99 Job: [id number]

## 2011-05-10 NOTE — H&P (Signed)
NAME:  Veronica Martinez, Veronica Martinez                         ACCOUNT NO.:  [id number]   MEDICAL RECORD NO.:  [id number]                   PATIENT TYPE:  INP   LOCATION:  NA                                   [facility]   PHYSICIAN:  Hal Morales, M.D.             DATE OF BIRTH:  December 16, 1956   DATE OF ADMISSION:  DATE OF DISCHARGE:                                HISTORY & PHYSICAL   HISTORY OF PRESENT ILLNESS:  The patient is a 55 year old married African-  American female para 3-0-0-3, who is status post total abdominal  hysterectomy who presents for exploratory laparotomy with a left  oophorectomy because of severe pelvic pain and a complex left ovarian mass.  This current patient of [facility] was referred by [facility] after being seen on March 11, 2004, for a one-week  history of severe abdominal pain.  At that visit, the patient was discovered  to have, by CT scan, a large complex solid cystic mass measuring 10.9 cm x  6.3 cm within the pelvis, which was centrally located and extending to the  right upper midline.  This mass also abutted the left ovary and adnexa.  A  soft tissue density was seen in the left pelvis measuring 5.2 cm x 3.2 cm  with a question of a residual left ovary.  There was no definitive  adenopathy or evidence for diverticulitis visualized.  Her CBC and  urinalysis at the time of that visit were both within normal limits.  A  follow-up ultrasound at [facility] on March 12, 2004, showed a  9.1 cm mass with complex simple and solid components and an irregular border  abutting the left ovary.  There was no increased Doppler flow to this mass  and there was normal flow to the left ovary.  The right ovary was not  visualized on that study.  The course of the patient's pain which began one  week prior to her presentation gradually increased until she was given  Darvocet-N 100.  She reports, however, that it remained relentless  (rating  at a 10/10 on a 10-point scale) inspite of this therapy and did not receive  any relief until she was given Percocet which she took two tablets every  four to six hours.  After two weeks of this therapy, the patient now reports  occasional pelvic pain which she rates as a 5/10 on a 10-point scale.  She  admits to positional dyspareunia but denies any nausea, vomiting, fever,  constipation, alleviating or exacerbating factors related to her symptoms.  In light of her discomfort and radiographic findings, the patient wishes to  proceed with surgical removal of her pelvic mass.   PAST MEDICAL HISTORY:  OB history; gravida 3, para 3-0-03.  The patient  denies any problems with her pregnancies.  GYN history; menarche 55 years  old. The patient has had  a hysterectomy.  She has a history of abnormal Pap  smears which were managed with colposcopy and found to have benign  pathology.  She denies any history of sexually transmitted disease.  Her  last normal Pap smear was October of 2004.  Last normal mammogram was  January of 2005.  Medical history; positive for breast cyst, hemorrhoids,  peptic ulcer disease, ovarian cyst, hyperthyroidism, anemia, migraines, and  pelvic pain (this pain which followed her previous abdominal surgery and she  received a GI and urologic workup both of which were normal).  Surgical  history; tonsillectomy as a child, 1982 bilateral tubal ligation, 1995  hemorrhoid surgery, 2000 total abdominal hysterectomy with lysis of  adhesions, 2001 laparoscopic appendectomy and right ovarian cystectomy, 2004  bilateral hip replacement at which time the patient received a blood  transfusion.  She denies any problems with anesthesia.   FAMILY HISTORY:  Positive for hypertension, diabetes mellitus, and stroke.   SOCIAL HISTORY:  The patient is married and she currently is unemployed.   CURRENT MEDICATIONS:  1. Celebrex 200 mg one tablet daily.  2. Oxycodone one or two  tablets every four to six hours as needed.  3. Methimazole 10 mg 1/2 tablet daily.   ALLERGIES:  ULTRAM, SULFA both of which cause hives.   HABITS:  The patient does not use alcohol or tobacco.   REVIEW OF SYSTEMS:  The patient reports occasional burning at the end of  urination and except as mentioned in history of present illness, review of  systems is negative.   PHYSICAL EXAMINATION:  VITAL SIGNS:  Blood pressure 130/76, weight 188,  height 5 feet 4 inches tall.  NECK:  Supple. The patient has diffuse enlargement of the right lobe of her  thyroid, however, there is no lymphadenopathy.  HEART:  Regular rate and rhythm, there is no murmur.  LUNGS:  Clear to auscultation. There are no wheezes, rales, or rhonchi.  BACK:  No CVA tenderness.  ABDOMEN:  Bowel sounds present.  It is soft. There is no tenderness,  guarding, rebound, palpable masses, or organomegaly.  EXTREMITIES:  Without cyanosis, clubbing, or edema.  PELVIC:  EGBUS is within normal limits.  Vagina is rugous.  Cervix and  uterus are surgically absent. Adnexa with bilateral tenderness right greater  than left.  Rectovaginal with tenderness but no palpable masses.   LABORATORY DATA:  March 12, 2004; comprehensive metabolic panel was within  normal limits, CA125 was 7.9, quantitative hCG less than 2, hyperthyroid  panel was within normal limits.  April 02, 2004; urinalysis was negative.   IMPRESSION:  1. Severe pelvic pain.  2. Complex pelvic mass 10.9 cm x 6.3 cm.  3. Status post hysterectomy.  4. Hyperthyroidism.   DISPOSITION:  The patient understands the implications for her procedure  along with its risks and she in consultation with her husband have consented  to undergo surgery for removal of her pelvic mass.  She understands the  risks include but are not limited to reaction to anesthesia, damage to  adjacent organs, infection, and excessive bleeding.  The patient is schedule to undergo exploratory laparotomy  with left oophorectomy and possible right  oophorectomy at [facility] on April 10, 2004, at 10:30 a.m.     Elmira J. Adline Peals.                    Hal Morales, M.D.    EJP/MEDQ  D:  04/05/2004  T:  04/05/2004  Job:  [id number]

## 2011-05-10 NOTE — H&P (Signed)
NAME:  ZYKIA, Veronica Martinez                         ACCOUNT NO.:  [id number]   MEDICAL RECORD NO.:  [id number]                   PATIENT TYPE:  INP   LOCATION:  NA                                   [facility]AC[facility]   PHYSICIAN:  Hal Morales, M.D.             DATE OF BIRTH:  1956/04/27   DATE OF ADMISSION:  DATE OF DISCHARGE:                                HISTORY & PHYSICAL   HISTORY OF PRESENT ILLNESS:  Ms. Veronica Martinez is a 55 year old married African-  American female, para  3-0-0-3, who is status post total abdominal hysterectomy, who presents for  exploratory laparotomy with a left oophorectomy because of severe pelvic  pain due to a complex left ovarian mass.  The patient was referred by [facility] after being seen on March 11, 2004 for a one-week  history of severe abdominal pain.  At that visit, the patient was discovered  to have by CT scan a large complex solid cystic mass measuring 10.9 x 6.3 cm  within the pelvis centrally located extending to the right upper midline and  abutting the left ovary and adnexa.  A soft tissue density was seen in the  left pelvis measuring 5.2 x 3.2 cm with a question of residual left ovary.  There was no definitive adenopathy or evidence for diverticulitis  visualized.  Her CBC and urinalysis at the time of that visit were both  within normal limits.  A follow-up ultrasound at [facility] on  March 12, 2004 showed a 9.1 cm mass with complex, simple, and solid  components and an irregular border abutting the left ovary.  There was no  increased Doppler flow to this mass and there was normal flow to her left  ovary.  The right ovary was not visualized on that study.  The patient  states that the pain of one week duration progressively increased until she  was seen on March 20 and was relentless in spite of being given Darvocet-N  100 for pain.  She denies any nausea or vomiting, fever, urinary tract  symptoms, or  constipation with her symptoms.  She denies any alleviating or  exacerbating factors for this pain and is ready to proceed with surgical  removal of this mass.   PAST MEDICAL HISTORY:  1. Positive for breast cyst.  2. Hemorrhoids.  3. Peptic ulcer disease.  4. Ovarian cyst.  5. Hyperthyroidism.  6. Anemia.  7. Migraines.  8. The patient has a past history of pelvic pain subsequent to her abdominal     surgery (a GI and urologic workup were both normal).   OB HISTORY:  Gravida 3, para 3-0-0-3.  The patient denies any problems with  her pregnancies.   GYN HISTORY:  Menarche 55 years old.  The patient has had a hysterectomy.  She does have a history of abnormal Pap smears which with colposcopy was  found  to have benign pathology.  She denies any history of sexually  transmitted diseases.  Her last Pap smear was October 2004.  Normal  mammogram January 2005.   FAMILY HISTORY:  Negative.   PAST SURGICAL HISTORY:  1. Tonsillectomy as a child.  2. 1982, bilateral tubal ligation.  3. 2000, total abdominal hysterectomy with lysis of adhesions.  4. 2001, appendectomy with a right ovarian cystectomy.  The patient denies     any problems with anesthesia, denies any history of blood transfusions.  5. 2003, bilateral hip replacement.   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   CURRENT MEDICATIONS:  1. Celebrex 200 mg one tablet daily.  2. Hydrocodone two tablets every 4-6 hours as needed for pain.  3. Methimazole 10 mg daily.   ALLERGIES:  The patient is allergic to ULTRAM.   HABITS:  She does not smoke or use alcohol.   REVIEW OF SYSTEMS:  Negative except as mentioned in history of present  illness.   PHYSICAL EXAMINATION:  VITAL SIGNS:  Blood pressure is 130/76, weight is  188, height is 5 feet 4 inches tall.  NECK:  Supple.  The patient has diffuse thyromegaly, but no adenopathy.  HEART:  Regular rate and rhythm.  There is  no murmur.  LUNGS:  Clear to auscultation.  There are no wheezes, rales, or rhonchi.  BACK:  No CVA tenderness.  ABDOMEN:  Bowel sounds are present.  The patient has exquisite tenderness  with guarding diffusely with no rebound.  There are no palpable masses and  no apparent organomegaly.  EXTREMITIES:  Without clubbing, cyanosis, or edema.  PELVIC:  EGBUS is within normal limits.  Vagina is rugous.  Cervix and  uterus are surgically absent.  Adnexa:  The patient has exquisite tenderness  in both adnexa with the ability to palpate a somewhat cystic mass throughout  both adnexa.  Rectovaginal exam confirms.   LABORATORY DATA:  March 12, 2004:  Comprehensive metabolic panel was within  normal limits.  CA-125 7.9, quantitative HCG less than 2.  Hyperthyroid  panel was within normal limits.   IMPRESSION:  1. Severe pelvic pain.  2. Complex pelvic mass (10.9 cm x 6.3 cm).  3. Status post hysterectomy.  4. Hyperthyroidism.   DISPOSITION:  The patient understands the implications for her procedure  along with the risks and she and her husband have together consented to  undergo surgery for removal of her pelvic mass.  She understands the risks  which include but are not limited to, reaction to anesthesia, damage to  adjacent organs, infection, and excessive bleeding.  The patient is  scheduled to undergo exploratory laparotomy with a left oophorectomy and  possible right oophorectomy at [facility] April 10, 2004, at 10:30 a.m.     Elmira J. Adline Peals.                    Hal Morales, M.D.    EJP/MEDQ  D:  03/25/2004  T:  03/25/2004  Job:  [id number]

## 2011-05-10 NOTE — Op Note (Signed)
[facility]  Patient:    Veronica Martinez, Veronica Martinez                      MRN: [id number] Proc. Date: 07/29/00 Adm. Date:  [id number] Attending:  Abigail Miyamoto A                           Operative Report  PREOPERATIVE DIAGNOSIS:  Abdominal pain.  POSTOPERATIVE DIAGNOSIS:  Hemorrhagic right ovarian cyst.  PROCEDURE: 1. Diagnostic laparoscopy. 2. Laparoscopic appendectomy.  SURGEON:  Abigail Miyamoto, M.D.  ANESTHESIA:  General endotracheal.  INDICATIONS:  The patient is a 55 year old female who presented with crampy abdominal pain for 24 hours with pain localizing to the right lower quadrant. During the examination, she was Martinez to have tenderness in the right lower quadrant.  Abdominal plain x-rays showed air fluid level consistent with a possible bowel obstruction.  She was also Martinez to have a white blood count of 12,000.  Given these findings, the decision was made to take the patient to the operating room for diagnostic laparoscopy for possible appendectomy versus bowel obstruction.  Previous surgical history included a hysterectomy.  FINDINGS:  The patient was Martinez to have a large hemorrhagic right ovarian cyst and a normal-appearing appendix.  An appendectomy was performed.  DESCRIPTION OF PROCEDURE:  The patient was brought to the operating room and identified as Veronica Martinez.  She was placed supine on the operating table and general anesthesia was induced.  The abdomen was then prepped and draped in the usual sterile fashion.  Using a #15 blade, a small transverse incision was made over the umbilicus.  The incision was carried down through the fascia. The fascia was then opened with the scalpel.  A 3-0 Vicryl pursestring suture was then placed around the fascial opening.  The ________ was then placed in the opening and insufflation of the abdomen was begun.  Exploration of the abdomen revealed a large cystic right ovary that appeared  hemorrhaged, and this was relieved with the sucker.  The appendix appeared normal.  There were multiple loops of dilated bowel, but no obvious bowel obstruction or ischemic bowel.  At this point, the decision was made to proceed with an appendectomy.  A 10 mm port was placed under direct visualization above the midline, and a 5 mm port was placed in the patients right flank under direct vision.  Next, a laparoscopic appendectomy was performed by first taking down the mesial appendix with the Endostapler with the vascular cartilage.  Next, the appendix was transected at its base with endoscopic GIA stapler. The appendix was then removed from the umbilical port.  The abdomen was then copiously irrigated with normal saline.  Again, hemostasis appeared to be achieved.  A picture was taken of the hemorrhagic ovarian cyst after the clot was aspirated from this.  At this point, all ports were then removed and the abdomen was deflated.  The 0 Vicryl suture at the umbilical incision was then fastened in place.  All incisions were then anesthetized with 0.25% Marcaine.  All skin incisions were then closed with 4-0 Monocryl subcuticular sutures.  Steri-Strips, gauze, and tape were then applied.  The patient tolerated the procedure well.  All sponge, needle, and instrument counts were correct at the end of the procedure.  The patient was then extubated in the operating room and taken in stable condition to the recovery room. DD:  07/29/00 TD:  07/30/00  Job: [id number]

## 2011-05-10 NOTE — Discharge Summary (Signed)
NAME:  Veronica Martinez, Veronica Martinez                         ACCOUNT NO.:  [id number]   MEDICAL RECORD NO.:  [id number]                   PATIENT TYPE:  INP   LOCATION:  5035                                 FACILITY:  [facility]   PHYSICIAN:  Feliberto Gottron. Turner Daniels, M.D.                DATE OF BIRTH:  [birth year]   DATE OF ADMISSION:  10/19/2003  DATE OF DISCHARGE:  10/25/2003                                 DISCHARGE SUMMARY   PRIMARY DIAGNOSIS:  Bilateral hip avascular necrosis.   PROCEDURE:  Bilateral total hip arthroplasty.   DISCHARGE SUMMARY/HISTORY OF PRESENT ILLNESS:  The patient is a 55 year old  woman with right rib and left hip pain that has increased markedly over the  last nine months.  Radiographs were consistent with an early AVN on the  right and positive collapse.  An MRI scan showed AVN with collapse on the  right, AVN with near collapse on the left.  Because of severe, unremitting  pain, the patient has elected to have total hip arthroplasties bilaterally  and has further elected to have them done simultaneously to decrease the  time out of work as it is her desire. We will caution her about the slightly  increased risk of mortality, and she accepts this and wishes to continue.  She has failed conservative treatment with antiinflammatory medications,  exercise, orthotic shoes, crutches and a walker.   ALLERGIES:  She is allergic to [facility] as well as ULTRAM, both of which  caused hives.   MEDICATIONS:  1. Darvocet as needed for pain.  2. Methimazole.  3. Iron.   SURGICAL HISTORY:  1. Hysterectomy in 2000.  2. Appendectomy in 2001.  3. Had difficulty with DVT.   PAST MEDICAL HISTORY:  1. Usual childhood diseases.  2. History of enlarged thyroid.   SOCIAL HISTORY:  No tobacco, alcohol or IV drug abuse.  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   FAMILY HISTORY:  Positive for diabetes and cancer in her sister.  Mother is  20.  Positive hypertension and DJD.  Alive and well otherwise.  Father is  alive at age 5 with no medical history to report.   REVIEW OF SYSTEMS:  Positive for glasses, positive for shortness of breath  with exertion, particularly noted for treatment of the thyroid.  Positive  braces upper and lower.  The patient denies chest pain or recent illness.   PHYSICAL EXAMINATION:  VITAL SIGNS:  Temperature 98.3, pulse 60,  respirations 16, blood pressure 120/80.  GENERAL:  The patient is a pleasant 5 feet, 7 inches, 211 pound female.  HEENT:  Head is normocephalic, atraumatic.  EOM's are clear.  Eyes:  Pupils  equal, round and reactive to light and accommodation, but nose and throat is  clear.  NECK:  Supple, full range of motion.  CHEST:  Clear to auscultation and percussion.  HEART:  Regular rate and rhythm.  ABDOMEN:  Soft,  nontender, no masses.  EXTREMITIES:  Severe pain with internal and external rotation bilaterally.  Foot tap bilaterally positive.  NEUROLOGIC:  Otherwise, neurovascularly intact.   STUDIES:  X-rays showed positive collapse and sclerotic changes, and  positive bilateral hip AVNs.   PREOPERATIVE LABORATORY DATA:  CBC, CMET, chest x-ray, EKG, PT and PTT were  all within normal limits with the exception of hemoglobin of 11.1,  hematocrit 32.0, BUN 5.   HOSPITAL COURSE:  On the day of admission, the patient was taken to [facility] operating room.  She underwent bilateral total hip  arthroplasties utilizing Depuy SROM component on the right hip.  She  received an 18 x 13 x 168 x42 standard neck femoral stem with a 28 mm plus 0  head, 18 D small cone, a 50 mm acetabulum on the left side.  She also  received a 50 mm acetabulum 20 mm head, 18 x13 x160 x42 standard neck.  Both  received Ultimate potential lining up to a 28 mm ball on the right side  because of a rotational crack found at the time of driving the femoral  stent.  A Dall-Miles cable was also applied around the hip for greater  support.   The patient was placed on  preoperative antibiotics for the first 48 hours.  She was placed on Coumadin prophylaxis with a target INR of  1.5 to 2.  She  was placed on PCA with Dilaudid.  Physical therapy scheduled through Dr.  Morrie Sheldon.   On postoperative day #1, the patient was awake and alert.  The pain was  controlled by PCA.  Positive nausea and Tmax of 101.1, slight drainage on  the right but left dry.  Hemoglobin was 9.2.  The patient was  neurovascularly intact.  PT 15.2.  X-ray showed stable components.  She  began weightbearing as tolerated.  Ambulation with PT.   On postoperative day #2, the patient was complaining of pain greater on left  than right hip, Tmax 101.4 to range of range is 97.8, hemoglobin 8.6, INR  3.4.  Dressings were dry, ___________ edema, otherwise, neurovascularly  intact.  She can continue physical therapy.   On postoperative day #3, the patient was making progress.  Tmax 100.  The  wound was clean and dry.  Hemoglobin 8.1, INR 2.1.  The patient had been  complaining of some dizziness and weakness when trying to get up with  physical therapy, but she continued to have two units of autologous blood.  This was transfused postoperative day 3.   On postoperative day #4, the patient complained of moderate hip pain.  Temperature maximum 99.5. Vital signs were stable.  Hemoglobin 8.7, INR was  0.7; subsequently she was greatly improved.  She continued physical therapy.  Rehabilitation consultation was obtained because of the slow progress with  physical therapy.   On postoperative day #5, the patient was seen by rehabilitation who felt  that she would benefit from clinical rehabilitation stay in house.  She was  awaiting rehabilitation transfer.  She as awake and alert.  Temperature  maximum 100.1.  Hemoglobin 8.7, and otherwise, medically and orthopedically  stable.  She continued with physical therapy and Coumadin.  Awaiting rehabilitation intervention.   On postoperative day #6, the  patient was awake and alert, eating well.  She  was complaining of some mild left greater than right hip pain.  She was  afebrile.  Wounds were showing some serous drainage but no subcutaneous  infection.  She continued with rehabilitation, and a bed became available  for inpatient rehabilitation admission.  She was transferred to their care.   DISCHARGE INSTRUCTIONS:  1. At the time of her discharge, her pain was well controlled with oral pain     medications.  2. Diet was regular.  3. She was progressing with physical therapy and weightbearing as tolerated.  4. Bilateral total hip precautions.  5. She should return to see [place] within one week's time of her discharge from     physical therapy.      Laural Benes. Jannet Mantis.                     Feliberto Gottron. Turner Daniels, M.D.    Merita Norton  D:  11/21/2003  T:  11/21/2003  Job:  [id number]

## 2011-05-10 NOTE — Op Note (Signed)
NAME:  Veronica Martinez, Veronica Martinez                         ACCOUNT NO.:  [id number]   MEDICAL RECORD NO.:  [id number]                   PATIENT TYPE:  INP   LOCATION:  9311                                 FACILITY:  [facility]   PHYSICIAN:  Leonie Man, M.D.                DATE OF BIRTH:  04/04/1956   DATE OF PROCEDURE:  04/10/2004  DATE OF DISCHARGE:                                 OPERATIVE REPORT   PREOPERATIVE DIAGNOSIS:  Pelvic adhesions.   POSTOPERATIVE DIAGNOSIS:  Pelvic adhesions.   PROCEDURE:  Adhesiolysis and repair of serosal tear to the sigmoid colon.   SURGEON:  Leonie Man, M.D.   ASSISTANT:  Maris Berger. Pennie Rushing, M.D.   ANESTHESIA:  General.   NOTE:  This is an operating room consultation done by me and requested by  Dr. Dierdre Forth on [name], who is a 55 year old woman with severe  endometriosis and being operated on for endometriosis and undergoing  bilateral oophorectomies.  During the course of the operation she had a  portion of her sigmoid colon intimately adhered to the right ovary.  I was  asked to come in for an operating room consultation by Dr. Pennie Rushing.   Upon entering the room the patient was already asleep and her surgery under  way.  I entered the operative field, at which point I assisted in taking  down the infundibulopelvic ligament on the right and then I dissected the  ovary free from its remaining attachments to the sigmoid colon right at the  rectosigmoid junction.  Following this there was a large serosal gap, which  was repaired with interrupted Lembert sutures of 3-0 silk.  This having been  accomplished, the abdomen was thoroughly irrigated, the colon was tested,  and the lumen noted to be widely patent.  I then left the operative field  and Dr. Pennie Rushing completed the operation with her assistant.                                               Leonie Man, M.D.    PB/MEDQ  D:  04/10/2004  T:  04/11/2004  Job:  [id number]   cc:   Hal Morales, M.D.  Fax: [phone]

## 2011-05-10 NOTE — Op Note (Signed)
NAME:  MEGGIE, LASETER                         ACCOUNT NO.:  [id number]   MEDICAL RECORD NO.:  [id number]                   PATIENT TYPE:  INP   LOCATION:  2895                                 FACILITY:  [facility]   PHYSICIAN:  Feliberto Gottron. Turner Daniels, M.D.                DATE OF BIRTH:  1956/04/19   DATE OF PROCEDURE:  10/19/2003  DATE OF DISCHARGE:                                 OPERATIVE REPORT   PREOPERATIVE DIAGNOSIS:  Avascular necrosis of both hips with collapse on  the right and precollapse on the left and severe pain.   POSTOPERATIVE DIAGNOSIS:  Avascular necrosis of both hips with collapse on  the right and precollapse on the left and severe pain.   PROCEDURE:  Bilateral total hip arthroplasties.   SURGEON:  On the right hip, Feliberto Gottron. Turner Daniels, M.D., was primary, and Harvie Junior, M.D., was first assist.  On the left side, Feliberto Gottron. Turner Daniels, M.D.,  primary, and Skip Mayer was first assist.   ESTIMATED BLOOD LOSS:  About 600 mL.   FLUID REPLACEMENT:  2-1/2 liters of crystalloid and a unit of autologous  blood.   URINE OUTPUT:  About 400 mL.   INDICATIONS FOR PROCEDURE:  This 55 year old woman with right greater than  left hip pain that has increased markedly over the last 9 months.  Plain  radiographs were consistent with early AVN on the right side and now  collapsing.  MRI scan showed AVN with collapse on the right and AVN with  near collapse on the left side.  Because of severe, unremitting pain, she  has elected to have total hip arthroplasties and has further elected to have  them both done simultaneously, or at the same time, to decrease the time out  of work, and it is her desire.  We have cautioned her about the slightly  increased risk of mortality and slightly decreased risk of morbidity with  bilateral procedures, and she accepts this and wants to have one trip to the  operating room.  She has failed conservative treatment with anti-  inflammatory medicines,  exercise, judicious use of narcotics, crutches, and  a walker.   DESCRIPTION OF PROCEDURE:  The patient was identified by arm band and taken  to the operating room at [facility] main hospital.  Appropriate anesthetic  monitors were attached and general endotracheal anesthesia induced with the  patient in the supine position.  A Foley catheter was then placed.  She was  then rolled into the left lateral decubitus position, a fixator with a  Stulberg Mark II pelvic clamp, and the right lower extremity was prepped and  draped in the usual sterile fashion from the ankle to the hemipelvis.  The  skin along the lateral hip and thigh was infiltrated with 20 mL of 0.5%  Marcaine and epinephrine solution, and a posterolateral approach to the hip  was made using a  22 cm incision, centered over the greater trochanter  through the skin and subcutaneous tissue.  The small bleeders were  identified and cauterized with the electrocautery.  A posterolateral  incision was made in the IT band going up over the greater trochanter and  posteriorly into the gluteus maximus muscle.  This was split, exposing the  greater trochanter.  A Cobra retractor was placed between the gluteus  minimus and the hip joint capsule superiorly and between the quadratus  femoris and the inferior hip joint capsule.  The piriformis and short  external rotators were then tagged with two #2 Ethibond sutures and cut off  at their insertion on the intertrochanteric crest, exposing the posterior  hip joint capsule, which was likewise developed into an acetabular-based  flap and tagged with two #2 Ethibond sutures.  The hip was then flexed and  internally rotated, dislocating the femoral head which did have AVN lid  collapse.  A standard neck cut was performed one fingerbreadth above the  lesser trochanter and then the proximal femur translocated anteriorly with a  Homan retractor levering off the anterior column, a Cobra retractor in the   cotyloid notch region and then posterosuperior and posteroinferior wing  retractors.  This allowed removal of the acetabular labrum, further exposing  the acetabulum, which was then sequentially reamed up to a 49 mm basket  reamer, obtaining good coverage to all quadrants.  We then selected a 50 mm  pinnacle cup, which was then hammered into place, obtaining a good, scratch  fit.  A trial 0 liner was then placed in anticipation of the 0-degree metal  liner.  The hip was then flexed and internally rotated, exposing the  proximal femur, which was entered with the box-cutting chisel from the S-ROM  set, followed by the straight cylindrical reamers.  We reamed up to a 13.5  mm cylindrical reamer, going the full depth because of the hardness of her  bone and even went down about half way with the 14 mm because the bone was  so hard.  We then conically reamed up to an 18D cone, followed by a calcar,  milling to a small calcar spout.  A trial 18D small cone was placed in the  proximal femur, followed by a trial stem, 42 base neck, and a 0NK +28 mm  head.  The hip was reduced, flexed to 90, and internally rotated 60 degrees  with good stability and could not be dislocated anteriorly.  At this point,  the trial component was removed from the femur and the acetabular shell.  The central occluder was then placed in the acetabulum, followed by a  Metasul liner, 28 mm, hammered into the socket.  On the femoral side, an 18D  small ZTT-1 cone was hammered into place, followed by an 18 x 13 x 42 neck  stem with a good fit and fill in the same version as the femoral neck.  The  anteversion of the socket was about 20 degrees with 45 degrees of abduction.  The anteversion of the femur itself was right around 20 degrees.  An NK+0 28  mm head was then hammered on the S-ROM stem, the hip reduced, and once  again, excellent stability noted.  The wound was washed out again with normal saline solution.  The  acetabular-based flap, short external rotators,  and piriformis were repaired back to the intertrochanteric crest through  drill holes.  Because there was minimal bleeding, we then closed in layers,  closing the IT band with running #1 Vicryl stitch and the subcutaneous  tissue with 0 and 2-0 undyed Vicryl suture and the skin with skin staples.  A dressing of Xeroform, 4 x 4 dressing, sponges, ABDs, and Hypafix tape was  applied.  At this point, we checked with anesthesia to make sure the patient  was stable.  She was unclamped, rolled supine, and then carefully rolled  into the right lateral decubitus position in anticipation of the left total  hip arthroplasty.   The pelvis was once again placed vertical with a Stulberg Mark II clamp and  the left lower extremity prepped and draped in the usual sterile fashion  from the ankle to the hemipelvis.  Once again, the skin along the lateral  hip and thigh was infiltrated with 10-20 mL of 0.5% Marcaine and epinephrine  solution.  A similar 22 cm incision was made along the skin, allowing a  posterolateral approach to the hip, and the approach was accomplished in a  similar fashion.  The piriformis and short external rotators were tagged, as  was the acetabular based flap.  The hip was dislocated.  This femoral head  had very little collapse but was in the process of collapsing.  A standard  neck cut was performed, and the acetabulum was reamed up to a 49 mm basket  reamer, and a similar 50 mm pinnacle shell was hammered into place in 45  degrees of abduction and 20 of anteversion.  The femur was then flexed and  internally rotated and prepared in a similar fashion of the right side,  pretty much for the same size as an 18D small cone and an 18 x 13 x 42 base  neck stem.  After successfully trialing the components and the trials  removed.  A 28 mm Metasul liner was hammered into the pinnacle cup after  placing the central occluder, 18D small cone  hammered into the femur, and an  18 x 13 x 42 stem hammered into the femur.  On the last blow, the cone was  noted to move about 2 mm.  We carefully inspected around the rim and found a  small posterior crack, which was fixed with a single Dall-Miles cable loop  in the intertrochanteric region.  Satisfied with the fixation of the small  proximal crack, a 28 mm NK+0 Metasul ball was hammered onto the stem and the  hip again reduced.  Stability was once again excellent.  The wound was also  washed out with normal saline solution.  Capsular and muscle repair was  accomplished, as it was on the other side, with a similar layered closure  and dressing.  At this point, the patient was rolled supine, awakened, and  taken to the recovery room without difficulty.                                               Feliberto Gottron. Turner Daniels, M.D.    Ovid Curd  D:  10/19/2003  T:  10/19/2003  Job:  [id number]

## 2011-05-22 ENCOUNTER — Ambulatory Visit (INDEPENDENT_AMBULATORY_CARE_PROVIDER_SITE_OTHER): Payer: Medicare Other | Admitting: Sports Medicine

## 2011-05-22 VITALS — BP 122/72

## 2011-05-22 DIAGNOSIS — G5710 Meralgia paresthetica, unspecified lower limb: Secondary | ICD-10-CM

## 2011-05-22 MED ORDER — AMITRIPTYLINE HCL 25 MG PO TABS: 25.0000 mg | ORAL_TABLET | Freq: Every day | ORAL | Status: DC

## 2011-05-22 NOTE — Patient Instructions (Signed)
Avoid forward bending. Continue walking. Try amitriptylline 25 mg at bedtime. Follow up 4-6 weeks.

## 2011-05-22 NOTE — Progress Notes (Signed)
  Subjective:    Patient ID: Veronica Martinez, female    DOB: 02/12/1956, 55 y.o.   MRN: [id number]  HPI 55 yo obese F with h/o b/l THA 2/2 AVN, chronic LBP, and meralgia paresthetica here for f/u.  Rt hip and back have been getting worse recently.  She is have shooting pains down her hip into her thigh.  Also with some cramps going into her calves.  Right sided shooting pains worse with bending forward.  Occasional night time pain.  Currently on neurontin 600 mg tid.   Review of Systems Denies F, C, S    Objective:   Physical Exam Gen: Obese F NAD Hips: FROM without pain b/l.  + Tinel's right side. Back: neg SLR       Assessment & Plan:  Meralgia paresthetica - trial of amitriptylline 25 mg qhs - continue walking - avoid bending - f/u 4-6 weeks

## 2011-07-14 ENCOUNTER — Other Ambulatory Visit: Payer: Self-pay | Admitting: Sports Medicine

## 2011-07-14 ENCOUNTER — Other Ambulatory Visit: Payer: Self-pay | Admitting: Family Medicine

## 2011-07-15 ENCOUNTER — Other Ambulatory Visit: Payer: Self-pay | Admitting: *Deleted

## 2011-07-15 ENCOUNTER — Other Ambulatory Visit: Payer: Self-pay | Admitting: Family Medicine

## 2011-07-15 ENCOUNTER — Ambulatory Visit (INDEPENDENT_AMBULATORY_CARE_PROVIDER_SITE_OTHER): Payer: Medicare Other | Admitting: Sports Medicine

## 2011-07-15 ENCOUNTER — Encounter: Payer: Self-pay | Admitting: *Deleted

## 2011-07-15 DIAGNOSIS — G5710 Meralgia paresthetica, unspecified lower limb: Secondary | ICD-10-CM

## 2011-07-15 DIAGNOSIS — M6530 Trigger finger, unspecified finger: Secondary | ICD-10-CM | POA: Insufficient documentation

## 2011-07-15 NOTE — Assessment & Plan Note (Signed)
This is clearly doing better with the addition of amitriptyline.  We will continue the amitriptyline and gabapentin.  She was to remain on the exercises from physical therapy.  We also started some easy walking for aerobic activity today.

## 2011-07-15 NOTE — Progress Notes (Signed)
  Subjective:    Patient ID: Veronica Martinez, female    DOB: 15-Dec-1956, 55 y.o.   MRN: [id number]  HPI Since last visit doing better;  Little pain in upper thigh but less. Has added amitriptyline at night. This took away numbness in side of leg. Some irritation after housework or working on knees Not wearing any tight garments Continues to do exercises from PT daily  Rt 2nd finger- unable to bend all the way down, and usually cannot bend at all at night.    Review of Systems     Objective:   Physical Exam     Neg straight leg raise to 70 deg bilat ER 60 deg, IR 40 deg on rt hip ER 60 deg, IR 35 deg on lt hip Hip flexion does not cause any pain or cramping in lying position Hip abduction strength good bilat Good hip flexion strength bilat in seated position  MS[facility] ultrasound Right second finger shows some thickening of the flexor tendon at the MCP joint. There is a soft tissue nodule noted just above the tendon that measures 0.7 cm in length PIP joint is normal and the flexor tendon appears normal distal to the MCP.   Assessment & Plan:

## 2011-07-15 NOTE — Patient Instructions (Signed)
Start trying to do walking for aerobic exercise - and build up to 30 minutes of walking   Continue on current medications

## 2011-07-15 NOTE — Assessment & Plan Note (Signed)
At the right second MCP she has a classic trigger finger.  Consent obtained and verified w timeout Sterile betadine prep. Furthur cleansed with alcohol. Topical analgesic spray: Ethyl chloride. Joint: 2nd MCP RT Approached in typical fashion with:ultrasound localization and directed into nodule Completed without difficulty - steroid visualized post injection w [place] Meds:.4cc kenalog and .4cc lidocaine 1% Needle: TB syringe w 30 g Aftercare instructions and Red flags advised.  She is to use the hand in warm water with easy grip exercises to loosen this up We will recheck this in one month

## 2011-07-17 ENCOUNTER — Other Ambulatory Visit: Payer: Self-pay | Admitting: Family Medicine

## 2011-07-20 ENCOUNTER — Other Ambulatory Visit: Payer: Self-pay | Admitting: Family Medicine

## 2011-07-22 ENCOUNTER — Other Ambulatory Visit: Payer: Self-pay | Admitting: *Deleted

## 2011-07-22 ENCOUNTER — Other Ambulatory Visit: Payer: Self-pay | Admitting: Family Medicine

## 2011-07-22 MED ORDER — AMITRIPTYLINE HCL 25 MG PO TABS: 25.0000 mg | ORAL_TABLET | Freq: Every day | ORAL | Status: DC

## 2011-08-05 ENCOUNTER — Ambulatory Visit (INDEPENDENT_AMBULATORY_CARE_PROVIDER_SITE_OTHER): Payer: Medicare Other | Admitting: Sports Medicine

## 2011-08-05 VITALS — BP 125/76

## 2011-08-05 DIAGNOSIS — M6530 Trigger finger, unspecified finger: Secondary | ICD-10-CM

## 2011-08-05 NOTE — Progress Notes (Signed)
  Subjective:    Patient ID: Veronica Martinez, female    DOB: 07-04-1956, 55 y.o.   MRN: [id number]  HPI 55 y/o female here for follow up from an injection of a left 4th digit trigger finger.  She has had 10% improvement in catching.  The pain has mostly resolved.  She is doing flexion/extension exercises in warm water.  Review of Systems     Objective:   Physical Exam  Left hand:  Fingers normal except affected finger 4th digit does catch about 50% of the time Tenderness to palpation over the flexor tendon No deformity of the 4th digit  Ultrasound: Flexor tendon of the 4th digit is visualized in the the long view.  Compared to the last ultrasound the flexor tendon nodule is much reduced in size.      Assessment & Plan:

## 2011-08-05 NOTE — Assessment & Plan Note (Signed)
Nodule is improving and subjective symptoms are improving.  Maximum effect of the injection should be seen in 12 weeks s/p injection.  She will follow up at 6 weeks for reassessment.  If the symptoms persist consider another injection vs. Surgical consultation.

## 2011-09-04 ENCOUNTER — Other Ambulatory Visit: Payer: Self-pay | Admitting: Emergency Medicine

## 2011-09-04 DIAGNOSIS — R519 Headache, unspecified: Secondary | ICD-10-CM

## 2011-09-13 ENCOUNTER — Ambulatory Visit
Admission: RE | Admit: 2011-09-13 | Discharge: 2011-09-13 | Disposition: A | Discharge status: Home / Self Care | Payer: Medicare Other | Source: Ambulatory Visit | Attending: Emergency Medicine | Admitting: Emergency Medicine

## 2011-09-13 DIAGNOSIS — R519 Headache, unspecified: Secondary | ICD-10-CM

## 2011-09-16 ENCOUNTER — Ambulatory Visit (INDEPENDENT_AMBULATORY_CARE_PROVIDER_SITE_OTHER): Payer: Medicare Other | Admitting: Sports Medicine

## 2011-09-16 VITALS — BP 122/77

## 2011-09-16 DIAGNOSIS — M6530 Trigger finger, unspecified finger: Secondary | ICD-10-CM

## 2011-09-16 NOTE — Assessment & Plan Note (Signed)
This is actually patient's left hand not right hand. Patient has improved tremendously no ultrasound done today due to patient's symptomatic recovery. Patient is to start doing more exercises under warm water at night. We will have patient wear a splint at night to decrease flexion which will hopefully not allow for the tendon to tighten. Patient will return as needed

## 2011-09-16 NOTE — Progress Notes (Signed)
Patient is here for followup of her trigger finger. Last visit patient did have an injection and since then has had improvement in symptoms. Patient does states the only time that is still locks up or pops is when she wakes up in the morning. Patient states throughout the day she has no problems no pain no locking or popping patient would just like to not have the evening problem.     ROS: Negative and as stated in the history of present illness  Physical exam: Vitals review Left hand: Patient's fourth finger does not show any nodularity no catching or popping full flexion and extension neurovascularly intact no edema or swelling along the tendon sheath.

## 2011-11-11 ENCOUNTER — Ambulatory Visit (INDEPENDENT_AMBULATORY_CARE_PROVIDER_SITE_OTHER): Payer: Medicare Other | Admitting: Sports Medicine

## 2011-11-11 VITALS — BP 133/88

## 2011-11-11 DIAGNOSIS — G5710 Meralgia paresthetica, unspecified lower limb: Secondary | ICD-10-CM

## 2011-11-11 DIAGNOSIS — M6530 Trigger finger, unspecified finger: Secondary | ICD-10-CM

## 2011-11-11 DIAGNOSIS — M25559 Pain in unspecified hip: Secondary | ICD-10-CM

## 2011-11-11 NOTE — Assessment & Plan Note (Signed)
Symptoms are well controlled on gabapentin.  She has enough for now.  She can continue gabapentin for now and her walking program.  She will try to avoid tight clothing in this area.

## 2011-11-11 NOTE — Assessment & Plan Note (Signed)
Cont  meds with no changes  Reck in 4 mos

## 2011-11-11 NOTE — Assessment & Plan Note (Signed)
Follow up for this will be PRN, if her symptoms return.

## 2011-11-11 NOTE — Progress Notes (Signed)
  Subjective:    Patient ID: Veronica Martinez, female    DOB: 01-01-1956, 55 y.o.   MRN: [id number]  HPI  55 y/o female is here to follow up for right hip pain and left trigger finger.  The trigger finger symptoms have completely resolved since her last visit.  The hip pain has also completely resolved with the use of gabapentin.  She has no difficulty tolerating the medication.   Review of Systems     Objective:   Physical Exam  Left hand No tenderness to palpation No catching of the fingers with flexion/extension No nodule palpated       Assessment & Plan:

## 2011-11-16 ENCOUNTER — Other Ambulatory Visit: Payer: Self-pay | Admitting: Sports Medicine

## 2011-12-31 ENCOUNTER — Ambulatory Visit (INDEPENDENT_AMBULATORY_CARE_PROVIDER_SITE_OTHER): Payer: Medicare Other | Admitting: Emergency Medicine

## 2011-12-31 DIAGNOSIS — R079 Chest pain, unspecified: Secondary | ICD-10-CM

## 2011-12-31 DIAGNOSIS — E782 Mixed hyperlipidemia: Secondary | ICD-10-CM

## 2011-12-31 DIAGNOSIS — R110 Nausea: Secondary | ICD-10-CM

## 2011-12-31 DIAGNOSIS — Z79899 Other long term (current) drug therapy: Secondary | ICD-10-CM

## 2012-01-01 ENCOUNTER — Other Ambulatory Visit: Payer: Self-pay | Admitting: Emergency Medicine

## 2012-01-01 DIAGNOSIS — R110 Nausea: Secondary | ICD-10-CM

## 2012-01-08 ENCOUNTER — Ambulatory Visit
Admission: RE | Admit: 2012-01-08 | Discharge: 2012-01-08 | Disposition: A | Discharge status: Home / Self Care | Payer: Medicare Other | Source: Ambulatory Visit | Attending: Emergency Medicine | Admitting: Emergency Medicine

## 2012-01-08 DIAGNOSIS — R110 Nausea: Secondary | ICD-10-CM

## 2012-01-13 ENCOUNTER — Encounter: Payer: Self-pay | Admitting: *Deleted

## 2012-01-16 ENCOUNTER — Ambulatory Visit (INDEPENDENT_AMBULATORY_CARE_PROVIDER_SITE_OTHER): Payer: Medicare Other | Admitting: Sports Medicine

## 2012-01-16 VITALS — BP 128/80

## 2012-01-16 DIAGNOSIS — G5710 Meralgia paresthetica, unspecified lower limb: Secondary | ICD-10-CM

## 2012-01-16 DIAGNOSIS — M25559 Pain in unspecified hip: Secondary | ICD-10-CM

## 2012-01-16 NOTE — Assessment & Plan Note (Signed)
This is in reasonable control She has done some home exercises and has gained strength  On testing the left side is still weaker than the right  She came to ahead and began the diet and exercise program but should modify her hip exercises that cause any pain or if she is too weak Begin some step exercises at home to help strengthen both hips

## 2012-01-16 NOTE — Assessment & Plan Note (Signed)
She currently has satisfactory control of symptoms with her medication regimen  We will not change this and we'll recheck her in 3 months

## 2012-01-16 NOTE — Patient Instructions (Signed)
Continue on same medications  Start your exercise regimen at [facility]   Please follow up in 3 months  Thank you for seeing [place] today!

## 2012-01-16 NOTE — Progress Notes (Signed)
  Subjective:    Patient ID: ZABELLA WEASE, female    DOB: 03/27/1956, 56 y.o.   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 Amitriptyline 25 mg qhs. Tramadol    Review of Systems     Objective:   Physical Exam  Hip rotation motion good bilat Hip rotation and flexion strength good Quad extension strong bilat Hip adduction strong bilat Step up on rt good, weaker on lt Unable to do lateral step up on lt without assistance, but able to do lateral step up on rt         Assessment & Plan:

## 2012-02-04 ENCOUNTER — Encounter: Payer: Self-pay | Admitting: Radiology

## 2012-03-05 ENCOUNTER — Other Ambulatory Visit: Payer: Self-pay | Admitting: Emergency Medicine

## 2012-03-07 ENCOUNTER — Ambulatory Visit (INDEPENDENT_AMBULATORY_CARE_PROVIDER_SITE_OTHER): Payer: Medicare Other | Admitting: Emergency Medicine

## 2012-03-07 VITALS — BP 148/84 | HR 96 | Temp 97.8°F | Resp 16 | Ht 63.75 in | Wt 255.8 lb

## 2012-03-07 DIAGNOSIS — J329 Chronic sinusitis, unspecified: Secondary | ICD-10-CM

## 2012-03-07 DIAGNOSIS — J069 Acute upper respiratory infection, unspecified: Secondary | ICD-10-CM

## 2012-03-07 DIAGNOSIS — J45909 Unspecified asthma, uncomplicated: Secondary | ICD-10-CM

## 2012-03-07 DIAGNOSIS — J029 Acute pharyngitis, unspecified: Secondary | ICD-10-CM

## 2012-03-07 DIAGNOSIS — R059 Cough, unspecified: Secondary | ICD-10-CM

## 2012-03-07 DIAGNOSIS — R05 Cough: Secondary | ICD-10-CM

## 2012-03-07 LAB — POCT INFLUENZA A/B
Influenza A, POC: NEGATIVE
Influenza B, POC: NEGATIVE

## 2012-03-07 LAB — POCT RAPID STREP A (OFFICE): Rapid Strep A Screen: NEGATIVE

## 2012-03-07 MED ORDER — AMOXICILLIN 875 MG PO TABS: 875.0000 mg | ORAL_TABLET | Freq: Two times a day (BID) | ORAL | Status: AC

## 2012-03-07 MED ORDER — BENZONATATE 200 MG PO CAPS: ORAL_CAPSULE | ORAL | Status: DC

## 2012-03-07 MED ORDER — ALBUTEROL SULFATE HFA 108 (90 BASE) MCG/ACT IN AERS: 2.0000 | INHALATION_SPRAY | RESPIRATORY_TRACT | Status: AC | PRN

## 2012-03-07 MED ORDER — ALBUTEROL SULFATE (2.5 MG/3ML) 0.083% IN NEBU
2.5000 mg | INHALATION_SOLUTION | Freq: Once | RESPIRATORY_TRACT | Status: AC
  Administered 2012-03-07: 2.5 mg via RESPIRATORY_TRACT

## 2012-03-07 NOTE — Progress Notes (Signed)
  Subjective:    Patient ID: Veronica Martinez, female    DOB: 17-Jan-1956, 56 y.o.   MRN: [id number]  HPI patient is a 4 to five-day history of head congestion sore throat sinus drainage. She feels like she has had a fever and does have some chills. She has had a yellowish drainage from her nose. She has not coughed up any blood or yellowish material.    Review of Systems noncontributory except as relates to this present in     Objective:   Physical Exam  Constitutional: She appears well-developed.  HENT:  Right Ear: External ear normal.  Left Ear: External ear normal.       There is a significant amount of nasal congestion with erythematous turbinates.  Eyes: Pupils are equal, round, and reactive to light.  Neck: No tracheal deviation present. No thyromegaly present.  Cardiovascular: Normal rate.  Exam reveals no gallop and no friction rub.   No murmur heard. Pulmonary/Chest: No respiratory distress. She has no wheezes. She has no rales. She exhibits no tenderness.  Lymphadenopathy:    She has no cervical adenopathy.          Assessment & Plan:   I'm not sure whether this represents an upper respiratory infection with secondary sinusitis or whether this is his flu or allergies. We'll go ahead and check a strep test and flu test and decide on treatment after these return .

## 2012-03-14 ENCOUNTER — Other Ambulatory Visit: Payer: Self-pay | Admitting: Sports Medicine

## 2012-03-16 ENCOUNTER — Other Ambulatory Visit: Payer: Self-pay | Admitting: *Deleted

## 2012-04-09 ENCOUNTER — Other Ambulatory Visit: Payer: Self-pay | Admitting: Physician Assistant

## 2012-04-28 ENCOUNTER — Ambulatory Visit (INDEPENDENT_AMBULATORY_CARE_PROVIDER_SITE_OTHER): Payer: Medicare Other | Admitting: Emergency Medicine

## 2012-04-28 ENCOUNTER — Encounter: Payer: Self-pay | Admitting: Emergency Medicine

## 2012-04-28 VITALS — BP 144/75 | HR 94 | Temp 98.0°F | Resp 16 | Ht 64.0 in | Wt 263.6 lb

## 2012-04-28 DIAGNOSIS — G252 Other specified forms of tremor: Secondary | ICD-10-CM

## 2012-04-28 DIAGNOSIS — R251 Tremor, unspecified: Secondary | ICD-10-CM

## 2012-04-28 DIAGNOSIS — J302 Other seasonal allergic rhinitis: Secondary | ICD-10-CM

## 2012-04-28 DIAGNOSIS — Z79899 Other long term (current) drug therapy: Secondary | ICD-10-CM

## 2012-04-28 DIAGNOSIS — E782 Mixed hyperlipidemia: Secondary | ICD-10-CM

## 2012-04-28 DIAGNOSIS — E669 Obesity, unspecified: Secondary | ICD-10-CM

## 2012-04-28 DIAGNOSIS — E785 Hyperlipidemia, unspecified: Secondary | ICD-10-CM

## 2012-04-28 LAB — LIPID PANEL
Cholesterol: 138 mg/dL (ref 0–200)
HDL: 80 mg/dL (ref >39)
LDL Cholesterol: 42 mg/dL (ref 0–99)
Total CHOL/HDL Ratio: 1.7 Ratio
Triglycerides: 78 mg/dL (ref <150)
VLDL: 16 mg/dL (ref 0–40)

## 2012-04-28 LAB — TSH: TSH: 3.452 u[IU]/mL (ref 0.350–4.500)

## 2012-04-28 LAB — T4, FREE: Free T4: 1.01 ng/dL (ref 0.80–1.80)

## 2012-04-28 MED ORDER — FLUTICASONE PROPIONATE 50 MCG/ACT NA SUSP: 2.0000 | Freq: Every day | NASAL | Status: DC

## 2012-04-28 NOTE — Progress Notes (Signed)
  Subjective:    Patient ID: Veronica Martinez, female    DOB: [birth year], 56 y.o.   MRN: [id number]  HPI patient enters for recheck of her cholesterol. She is asking whether her Crestor could be causing her tremor. She had a CT scan done the end of last year and this was normal. She has no family history of tremor. She is on Synthroid but levels of TSH and free T4 have been normal. She has no rigidity of her muscles no change in her facial appearance no other Parkinsonian symptoms.    Review of Systems     Objective:   Physical Exam Physical exam reveals an alert female who is not in distress. Her blood pressure is in range neck is supple chest clear heart rate no murmurs. She does have a fine tremor with intention. She does not have any cogwheel rigidity. She has no facial changes.       Assessment & Plan:  Lipid panel. Done we'll check TSH free T4. 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

## 2012-04-29 ENCOUNTER — Other Ambulatory Visit: Payer: Self-pay | Admitting: Obstetrics and Gynecology

## 2012-04-29 DIAGNOSIS — Z1231 Encounter for screening mammogram for malignant neoplasm of breast: Secondary | ICD-10-CM

## 2012-05-05 ENCOUNTER — Other Ambulatory Visit: Payer: Self-pay | Admitting: Family Medicine

## 2012-05-06 ENCOUNTER — Encounter: Payer: Self-pay | Admitting: Sports Medicine

## 2012-05-06 ENCOUNTER — Ambulatory Visit (INDEPENDENT_AMBULATORY_CARE_PROVIDER_SITE_OTHER): Payer: Medicare Other | Admitting: Sports Medicine

## 2012-05-06 VITALS — BP 117/76 | HR 96

## 2012-05-06 DIAGNOSIS — G5710 Meralgia paresthetica, unspecified lower limb: Secondary | ICD-10-CM

## 2012-05-06 NOTE — Assessment & Plan Note (Addendum)
Overall 100% improved. May cont meds, but may consider tapering off. RTC 6 months prn.

## 2012-05-06 NOTE — Progress Notes (Signed)
  Subjective:    Patient ID: Veronica Martinez, female    DOB: 1956-12-22, 56 y.o.   MRN: [id number]  HPI This patient comes back for followup of bilateral meralgia paresthetica. She has been on tramadol, gabapentin 600 mg 3 times a day, and amitriptyline 25 milligrams at bedtime. She had a minor setback last week when trying to do squats, however she is 100% resolved at this point.   Review of Systems    No fevers, chills, night sweats, weight loss, chest pain, or shortness of breath.  Social History: Non-smoker. Objective:   Physical Exam General:  Well developed, well nourished, and in no acute distress. Neuro:  Alert and oriented x3, extra-ocular muscles intact. Skin: Warm and dry, no rashes noted. Respiratory:  Not using accessory muscles, speaking in full sentences. Musculoskeletal: Hips with excellent internal and external rotation, painless. Negative lateral femoral cutaneous nerve compression test.  MSK ultrasound: I imaged the right lateral femoral cutaneous nerve in its position just medial to the ASIS. There was no fluid, or fibrous sheath seen around the nerve.       Assessment & Plan:

## 2012-05-08 ENCOUNTER — Other Ambulatory Visit: Payer: Self-pay | Admitting: Physician Assistant

## 2012-05-08 ENCOUNTER — Other Ambulatory Visit: Payer: Self-pay | Admitting: Family Medicine

## 2012-05-14 ENCOUNTER — Ambulatory Visit
Admission: RE | Admit: 2012-05-14 | Discharge: 2012-05-14 | Disposition: A | Discharge status: Home / Self Care | Payer: Medicare Other | Source: Ambulatory Visit | Attending: Obstetrics and Gynecology | Admitting: Obstetrics and Gynecology

## 2012-05-14 DIAGNOSIS — Z1231 Encounter for screening mammogram for malignant neoplasm of breast: Secondary | ICD-10-CM

## 2012-05-15 ENCOUNTER — Other Ambulatory Visit: Payer: Self-pay | Admitting: Obstetrics and Gynecology

## 2012-05-15 DIAGNOSIS — R928 Other abnormal and inconclusive findings on diagnostic imaging of breast: Secondary | ICD-10-CM

## 2012-06-01 ENCOUNTER — Ambulatory Visit
Admission: RE | Admit: 2012-06-01 | Discharge: 2012-06-01 | Disposition: A | Discharge status: Home / Self Care | Payer: Medicare Other | Source: Ambulatory Visit | Attending: Obstetrics and Gynecology | Admitting: Obstetrics and Gynecology

## 2012-06-01 DIAGNOSIS — R928 Other abnormal and inconclusive findings on diagnostic imaging of breast: Secondary | ICD-10-CM

## 2012-07-12 ENCOUNTER — Other Ambulatory Visit: Payer: Self-pay | Admitting: Sports Medicine

## 2012-09-01 ENCOUNTER — Ambulatory Visit (INDEPENDENT_AMBULATORY_CARE_PROVIDER_SITE_OTHER): Payer: Medicare Other | Admitting: Emergency Medicine

## 2012-09-01 VITALS — BP 123/64 | HR 90 | Temp 98.0°F | Resp 16 | Ht 64.5 in | Wt 263.0 lb

## 2012-09-01 DIAGNOSIS — Z79899 Other long term (current) drug therapy: Secondary | ICD-10-CM

## 2012-09-01 DIAGNOSIS — E785 Hyperlipidemia, unspecified: Secondary | ICD-10-CM

## 2012-09-01 DIAGNOSIS — Z23 Encounter for immunization: Secondary | ICD-10-CM

## 2012-09-01 DIAGNOSIS — E782 Mixed hyperlipidemia: Secondary | ICD-10-CM

## 2012-09-01 LAB — LIPID PANEL
Cholesterol: 135 mg/dL (ref 0–200)
HDL: 89 mg/dL (ref >39)
LDL Cholesterol: 33 mg/dL (ref 0–99)
Total CHOL/HDL Ratio: 1.5 Ratio
Triglycerides: 64 mg/dL (ref <150)
VLDL: 13 mg/dL (ref 0–40)

## 2012-09-01 NOTE — Progress Notes (Signed)
  Subjective:    Patient ID: Veronica Martinez, female    DOB: [birth year], 56 y.o.   MRN: [id number]  HPI patient enters for followup of her high cholesterol. She is currently on Crestor 10 mg a day last cholesterol being 138. She feels well she has been to see Dr. Talmage Nap  regarding her thyroid and the dosage was increased to 0.125 mg a day.    Review of Systems     Objective:   Physical Exam  Constitutional: She appears well-developed.  HENT:  Head: Normocephalic.  Eyes: Pupils are equal, round, and reactive to light.  Neck: No thyromegaly present.  Cardiovascular: Normal rate and regular rhythm.  Exam reveals no gallop and no friction rub.   No murmur heard. Pulmonary/Chest: Effort normal and breath sounds normal. She has no wheezes. She has no rales.  Abdominal: Soft. There is no tenderness. There is no rebound.  Musculoskeletal: Normal range of motion.          Assessment & Plan:  Patient get a flu shot today. We'll check a cholesterol today. If her cholesterol continues to be as good as last time we'll decrease her Crestor from 10 mg a day to 5 mg a day .

## 2012-11-01 ENCOUNTER — Other Ambulatory Visit: Payer: Self-pay | Admitting: Sports Medicine

## 2012-11-10 ENCOUNTER — Ambulatory Visit (INDEPENDENT_AMBULATORY_CARE_PROVIDER_SITE_OTHER): Payer: Medicare Other | Admitting: Sports Medicine

## 2012-11-10 VITALS — BP 120/80 | Ht 66.0 in | Wt 261.0 lb

## 2012-11-10 DIAGNOSIS — IMO0001 Reserved for inherently not codable concepts without codable children: Secondary | ICD-10-CM

## 2012-11-10 DIAGNOSIS — M791 Myalgia, unspecified site: Secondary | ICD-10-CM

## 2012-11-10 LAB — CBC
HCT: 40 % (ref 36.0–46.0)
Hemoglobin: 13.6 g/dL (ref 12.0–15.0)
MCH: 31.5 pg (ref 26.0–34.0)
MCHC: 34 g/dL (ref 30.0–36.0)
MCV: 92.6 fL (ref 78.0–100.0)
Platelets: 198 10*3/uL (ref 150–400)
RBC: 4.32 MIL/uL (ref 3.87–5.11)
RDW: 14.5 % (ref 11.5–15.5)
WBC: 6.6 10*3/uL (ref 4.0–10.5)

## 2012-11-10 NOTE — Patient Instructions (Addendum)
Please start Co enzyme Q 10-200 mg daily  Please stop crestor for the next 2 weeks  If pain is much better after 2 weeks, then restart crestor every other day  Please follow up in 1 month  Thank you for seeing [place] today!

## 2012-11-10 NOTE — Progress Notes (Signed)
Patient ID: Veronica Martinez, female   DOB: 1956/04/10, 56 y.o.   MRN: [id number]  Patient complains of bilateral shoulder pain and pain running down both arms. She gets pain in the upper arm but also some pain going down the dorsum into both hands. She has also had some cramps in both thighs that I originally attributed to her meralgia paresthetica.  Now she is having some pain in both calf muscles that radiates down into the Achilles more so on the left than the right.  Of concern is non-of these muscle pain seems to be associated with a specific activity.  She is at risk of muscle related complications with Crestor sets she also has hypothyroidism and takes Synthroid replacement.  Physical examination Obese black female in no acute distress Normal neck examination in neck range of motion Some spasm noted in the trapezius bilaterally Rotator cuff testing was normal right and left In all tests were impingement were negative  No pain on palpation of the thighs and no numbness Tightness on palpation of both calf muscles No tenderness to palpation on squeezing of the Achilles left or right

## 2012-11-10 NOTE — Assessment & Plan Note (Signed)
We will check a CK level a CBC and a sedimentation rate  I would like her to stop Crestor for 2 weeks of note her cholesterol was in excellent control  Start on coenzyme Q 200 mg daily  After 2 weeks we will try to reintroduce the Crestor  If she gets no pain relief with stopping the Crestor and using the coenzyme Q we will need to look for other causes of her muscle symptoms

## 2012-11-11 LAB — CK TOTAL AND CKMB (NOT AT ARMC)
CK, MB: 4.7 ng/mL — ABNORMAL HIGH (ref 0.3–4.0)
Relative Index: 2.1 (ref 0.0–2.5)
Total CK: 227 U/L — ABNORMAL HIGH (ref 7–177)

## 2012-11-11 LAB — SEDIMENTATION RATE: Sed Rate: 11 mm/hr (ref 0–22)

## 2012-11-11 NOTE — Progress Notes (Signed)
Pt did have slightly elevated CK - 227, and CKMB 4.7.  Will continue with plan of having her stop crestor x2 weeks, start co-q10, and restart crestor in 2 weeks, but take every other day if her pain has improved.

## 2012-12-08 ENCOUNTER — Ambulatory Visit (INDEPENDENT_AMBULATORY_CARE_PROVIDER_SITE_OTHER): Payer: Medicare Other | Admitting: Sports Medicine

## 2012-12-08 VITALS — BP 150/85 | Ht 66.0 in | Wt 260.0 lb

## 2012-12-08 DIAGNOSIS — R269 Unspecified abnormalities of gait and mobility: Secondary | ICD-10-CM

## 2012-12-08 DIAGNOSIS — M79609 Pain in unspecified limb: Secondary | ICD-10-CM

## 2012-12-08 DIAGNOSIS — M791 Myalgia, unspecified site: Secondary | ICD-10-CM

## 2012-12-08 DIAGNOSIS — E669 Obesity, unspecified: Secondary | ICD-10-CM

## 2012-12-08 DIAGNOSIS — IMO0001 Reserved for inherently not codable concepts without codable children: Secondary | ICD-10-CM

## 2012-12-08 DIAGNOSIS — M25559 Pain in unspecified hip: Secondary | ICD-10-CM

## 2012-12-08 DIAGNOSIS — M79671 Pain in right foot: Secondary | ICD-10-CM | POA: Insufficient documentation

## 2012-12-08 NOTE — Progress Notes (Signed)
  Subjective:    Patient ID: Veronica Martinez, female    DOB: [birth year], 56 y.o.   MRN: [id number]  HPI She is here for follow-up of her muscle aches and heel pain.   # Myalgias likely secondary to statin It has improved since she stopped Crestor. She is taking coenzyme-Q.   # Bilateral heel pain R > L This pain is persistent since her last visit 1 month ago and did not resolve with stopping statin. It started a few moths ago. It is worse after initiating activity after prolonged rest. She denies inciting event.  She is not taking medication for the pain.   Review of Systems  Allergies, medication, past medical history reviewed.      Objective:   Physical Exam GEN: NAD; obese HEEL:   Moderate tenderness at calcaneus but not over other parts of Achilles as well as at bottom of heel   Strength of foot intact   Pes planus bilaterally  MSK ultrasound right foot Achilles tendon appears normal Swelling at plantar surface of calcaneus  Moderate thickening of the plantar fascia on the right and the plantar fascia does extend back to the posterior heel     Assessment & Plan:

## 2012-12-08 NOTE — Assessment & Plan Note (Signed)
It has resolved since stopping Crestor. Her LDL 30s when last cheecked 08/2012. She has taken coenzyme Q for the past month. She will continue and restart at low dose 5 mg.

## 2012-12-08 NOTE — Patient Instructions (Addendum)
Continue coenzyme Q to help prevent muscle breakdown  Continue Crestor at 5 mg until you see Dr. Harlene Salts the heel cups Try the heel lifts Do the calf stretching/strengthening exercises  Follow-up in 2 months

## 2012-12-08 NOTE — Assessment & Plan Note (Addendum)
She appears to have some bruising of right heel on ultrasound. Heel lifts seem to help her pain. She was also advised to wear heel cups.   Some element of plantar fasciitis but this appears mild and most of the changes seem associated with bruising in the calcaneal fat pad

## 2013-02-05 ENCOUNTER — Ambulatory Visit (INDEPENDENT_AMBULATORY_CARE_PROVIDER_SITE_OTHER): Payer: Medicare Other | Admitting: Emergency Medicine

## 2013-02-05 VITALS — BP 148/77 | HR 97 | Temp 97.9°F | Resp 16 | Ht 66.0 in | Wt 267.0 lb

## 2013-02-05 DIAGNOSIS — G5600 Carpal tunnel syndrome, unspecified upper limb: Secondary | ICD-10-CM

## 2013-02-05 DIAGNOSIS — G7109 Other specified muscular dystrophies: Secondary | ICD-10-CM

## 2013-02-05 DIAGNOSIS — M6530 Trigger finger, unspecified finger: Secondary | ICD-10-CM

## 2013-02-05 MED ORDER — NAPROXEN SODIUM 550 MG PO TABS: 550.0000 mg | ORAL_TABLET | Freq: Two times a day (BID) | ORAL | Status: DC

## 2013-02-05 NOTE — Progress Notes (Signed)
[facility] [address], [place] [address] [phone]  Date:  02/05/2013   Name:  Veronica Martinez   DOB:  1956/11/03   MRN:  [id number]  PCP:  Donnetta Hail, MD    Chief Complaint: Hand Pain   History of Present Illness:  Veronica Martinez is a 57 y.o. very pleasant female patient who presents with the following:  History of left hand trigger finger treated with injection.  Now has trigger finger with locking of her right fourth finger.  Also experiencing numbness and tingling in right fourth and fifth fingers of right hand while using computer or phone and while sleeping it awakens her.  Denies any history of injury or other complaints.  Pain radiates from her hand into her upper arm.  Occasionally into the shoulder.  Patient Active Problem List  Diagnosis  . MERALGIA PARESTHETICA  . HIP PAIN, BILATERAL  . BACK PAIN, LUMBAR  . ABNORMALITY OF GAIT  . OBESITY, UNSPECIFIED  . Myalgia  . Pain of right heel    Past Medical History  Diagnosis Date  . Allergy     Past Surgical History  Procedure Laterality Date  . Shoulder surgery      History  Substance Use Topics  . Smoking status: Never Smoker   . Smokeless tobacco: Not on file  . Alcohol Use: No    History reviewed. No pertinent family history.  Allergies  Allergen Reactions  . Meloxicam   . Pravastatin Sodium   . Simvastatin   . Tramadol Hcl     Medication list has been reviewed and updated.  Current Outpatient Prescriptions on File Prior to Visit  Medication Sig Dispense Refill  . albuterol (PROVENTIL HFA;VENTOLIN HFA) 108 (90 BASE) MCG/ACT inhaler Inhale 2 puffs into the lungs every 4 (four) hours as needed for wheezing.  1 Inhaler  0  . amitriptyline (ELAVIL) 25 MG tablet TAKE 1 TABLET (25 MG TOTAL) BY MOUTH AT BEDTIME.  30 tablet  3  . benzonatate (TESSALON) 200 MG capsule Take one tablet 3 times a day as needed for cough  20 capsule  0  . Cholecalciferol (VITAMIN D) 2000 UNITS  CAPS Take 2,000 Units by mouth daily.        . CRESTOR 10 MG tablet TAKE ONE TABLET BY MOUTH EVERY DAY  30 each  5  . fluticasone (FLONASE) 50 MCG/ACT nasal spray Place 2 sprays into the nose daily.  16 g  6  . gabapentin (NEURONTIN) 600 MG tablet TAKE 1 TABLET THREE TIMES A DAY  90 tablet  3  . Misc Natural Products (CALCIUM PLUS ADVANCED PO) Take 600 mg by mouth 2 (two) times daily.        . Multiple Vitamin (MULTIVITAMIN) tablet Take 1 tablet by mouth daily.        . Omega-3 Fatty Acids (FISH OIL) 1200 MG CAPS Take 1,200 mg by mouth 3 (three) times daily.        . pantoprazole (PROTONIX) 40 MG tablet Take 40 mg by mouth daily.        [name] SYNTHROID 112 MCG tablet TAKE 1 TABLET BY MOUTH EVERY DAY  30 tablet  2   No current facility-administered medications on file prior to visit.    Review of Systems:  As per HPI, otherwise negative.    Physical Examination: Filed Vitals:   02/05/13 1532  BP: 148/77  Pulse: 97  Temp: 97.9 F (36.6 C)  Resp: 16  Filed Vitals:   02/05/13 1532  Height: 5\' 6"  (1.676 m)  Weight: 267 lb (121.11 kg)   Body mass index is 43.12 kg/(m^2). Ideal Body Weight: Weight in (lb) to have BMI = 25: 154.6   GEN: WDWN, NAD, Non-toxic, Alert & Oriented x 3 HEENT: Atraumatic, Normocephalic.  Ears and Nose: No external deformity. EXTR: No clubbing/cyanosis/edema NEURO: Normal gait.  PSYCH: Normally interactive. Conversant. Not depressed or anxious appearing.  Calm demeanor.  Right Hand:  Tinel positive.  No wasting.  Trigger finger not demonstrated  Assessment and Plan: Carpal tunnel Trigger finger Follow up with Dr Marchia Bond, Tessa Lerner, MD

## 2013-02-27 ENCOUNTER — Other Ambulatory Visit: Payer: Self-pay | Admitting: Sports Medicine

## 2013-03-02 ENCOUNTER — Encounter: Payer: Self-pay | Admitting: Emergency Medicine

## 2013-03-02 ENCOUNTER — Ambulatory Visit (INDEPENDENT_AMBULATORY_CARE_PROVIDER_SITE_OTHER): Payer: Medicare Other | Admitting: Emergency Medicine

## 2013-03-02 VITALS — BP 150/80 | HR 87 | Temp 97.5°F | Resp 20 | Ht 64.0 in | Wt 265.6 lb

## 2013-03-02 DIAGNOSIS — E785 Hyperlipidemia, unspecified: Secondary | ICD-10-CM

## 2013-03-02 DIAGNOSIS — Z79899 Other long term (current) drug therapy: Secondary | ICD-10-CM

## 2013-03-02 DIAGNOSIS — M25579 Pain in unspecified ankle and joints of unspecified foot: Secondary | ICD-10-CM

## 2013-03-02 LAB — CBC WITH DIFFERENTIAL/PLATELET
Basophils Absolute: 0 10*3/uL (ref 0.0–0.1)
Basophils Relative: 0 % (ref 0–1)
Eosinophils Absolute: 0.1 10*3/uL (ref 0.0–0.7)
Eosinophils Relative: 2 % (ref 0–5)
HCT: 39.8 % (ref 36.0–46.0)
Hemoglobin: 13.5 g/dL (ref 12.0–15.0)
Lymphocytes Relative: 48 % — ABNORMAL HIGH (ref 12–46)
Lymphs Abs: 2.1 10*3/uL (ref 0.7–4.0)
MCH: 30.5 pg (ref 26.0–34.0)
MCHC: 33.9 g/dL (ref 30.0–36.0)
MCV: 90 fL (ref 78.0–100.0)
Monocytes Absolute: 0.5 10*3/uL (ref 0.1–1.0)
Monocytes Relative: 12 % (ref 3–12)
Neutro Abs: 1.7 10*3/uL (ref 1.7–7.7)
Neutrophils Relative %: 38 % — ABNORMAL LOW (ref 43–77)
Platelets: 185 10*3/uL (ref 150–400)
RBC: 4.42 MIL/uL (ref 3.87–5.11)
RDW: 15 % (ref 11.5–15.5)
WBC: 4.5 10*3/uL (ref 4.0–10.5)

## 2013-03-02 NOTE — Progress Notes (Signed)
  Subjective:    Patient ID: Veronica Martinez, female    DOB: Mar 18, 1956, 57 y.o.   MRN: [id number]  HPI patient here to followup her high cholesterol. She had difficulties with pravastatin simvastatin and is currently on Crestor she has trouble with aching in her hips and buttocks and some discomfort with plantar fasciitis. Also recently has had trouble with a trigger finger and is scheduled to see Dr. Darrick Penna next week. She continues to try to exercise and watch her diet.    Review of Systems patient up-to-date on mammograms and colonoscopies     Objective:   Physical Exam patient is alert and cooperative and not in distress. Her neck is supple. Her chest is clear to auscultation and percussion. Cardiac exam reveals regular rate without murmurs. Abdomen soft liver and spleen not enlarged there no areas of tenderness. Extremities are without edema        Assessment & Plan:  We'll check her baseline labs today including cholesterol. She is to followup with Dr. Darrick Penna regarding her orthopedic issues. Otherwise recheck 6 months. She was given a prescription for shingles vaccine

## 2013-03-03 LAB — LIPID PANEL
Cholesterol: 155 mg/dL (ref 0–200)
HDL: 90 mg/dL (ref >39)
LDL Cholesterol: 56 mg/dL (ref 0–99)
Total CHOL/HDL Ratio: 1.7 Ratio
Triglycerides: 46 mg/dL (ref <150)
VLDL: 9 mg/dL (ref 0–40)

## 2013-03-03 LAB — COMPREHENSIVE METABOLIC PANEL
ALT: 23 U/L (ref 0–35)
AST: 24 U/L (ref 0–37)
Albumin: 4.4 g/dL (ref 3.5–5.2)
Alkaline Phosphatase: 98 U/L (ref 39–117)
BUN: 10 mg/dL (ref 6–23)
CO2: 28 mEq/L (ref 19–32)
Calcium: 9.8 mg/dL (ref 8.4–10.5)
Chloride: 104 mEq/L (ref 96–112)
Creat: 0.78 mg/dL (ref 0.50–1.10)
Glucose, Bld: 118 mg/dL — ABNORMAL HIGH (ref 70–99)
Potassium: 4.2 mEq/L (ref 3.5–5.3)
Sodium: 140 mEq/L (ref 135–145)
Total Bilirubin: 0.4 mg/dL (ref 0.3–1.2)
Total Protein: 7.3 g/dL (ref 6.0–8.3)

## 2013-03-03 LAB — CK: Total CK: 171 U/L (ref 7–177)

## 2013-04-22 ENCOUNTER — Ambulatory Visit (INDEPENDENT_AMBULATORY_CARE_PROVIDER_SITE_OTHER): Payer: Medicare Other | Admitting: Sports Medicine

## 2013-04-22 VITALS — BP 127/82 | Ht 64.0 in | Wt 236.0 lb

## 2013-04-22 DIAGNOSIS — G5601 Carpal tunnel syndrome, right upper limb: Secondary | ICD-10-CM | POA: Insufficient documentation

## 2013-04-22 DIAGNOSIS — M6530 Trigger finger, unspecified finger: Secondary | ICD-10-CM | POA: Insufficient documentation

## 2013-04-22 DIAGNOSIS — G5600 Carpal tunnel syndrome, unspecified upper limb: Secondary | ICD-10-CM

## 2013-04-22 MED ORDER — TRIAMCINOLONE ACETONIDE 10 MG/ML IJ SUSP
2.5000 mg | Freq: Once | INTRAMUSCULAR | Status: AC
  Administered 2013-04-22: 2.5 mg via INTRA_ARTICULAR

## 2013-04-22 MED ORDER — TRIAMCINOLONE ACETONIDE 10 MG/ML IJ SUSP: 2.5000 mg | Freq: Once | INTRAMUSCULAR | Status: DC

## 2013-04-22 NOTE — Assessment & Plan Note (Signed)
Treat with ultrasound guided injection Rest followup 4 weeks

## 2013-04-22 NOTE — Patient Instructions (Addendum)
Thank you for coming in today. Call or go to the ER if you develop a large red swollen joint with extreme pain or oozing puss.  Wear the night splint.  Return in 4 weeks or so.  Take it easy with your hand for a few days.

## 2013-04-22 NOTE — Progress Notes (Signed)
Veronica Martinez is a 57 y.o. female who presents to [facility] today for left hand pain and numbness. For the past one month as the patient is noted triggering of her right ring finger.  This is associated with some pain at the MCP.  About 2 years ago patient had trigger finger involving the left hand which is well treated with injection. She would like injection today if possible. She denies any injury and feels well.    However she does note at the same time for about one month numbness involving the interdigital nerves of both the ring and long finger.  She has numbness of the radial side of ring finger and ulnar side and long finger. Again she denies any injury and feels well otherwise.    PMH reviewed.  History  Substance Use Topics  . Smoking status: Never Smoker   . Smokeless tobacco: Not on file  . Alcohol Use: No   ROS as above otherwise neg   Exam:  BP 127/82  Ht 5\' 4"  (1.626 m)  Wt 236 lb (107.049 kg)  BMI 40.49 kg/m2 Gen: Well NAD MSK: Right hand Normal appearing and nontender Normal motion and strength Subjective numbness in the distribution as noted above Mild palpable triggering noted MCP ring finger.  Capillary refill and pulses are intact distally  Limited musculoskeletal ultrasound right hand Thickened retinaculum seen overlying the flexor tendon at the MCP at the A1 pulley. Mild tenosynovitis present.  No masses or neuromas seen between the metacarpal heads in the hand.   The carpal tunnel is visualized and a significantly enlarged median nerve is present measuring his 0.24 centimeter squared.  This is twice normal and consistent with carpal tunnel syndrome.   Ultrasound-guided trigger finger injection right ring finger Consent obtained timeout performed The A1 pulley was identified under ultrasound. The skin was then sterilized and the probe was sterilized and a Tegaderm was applied. Sterile gel was used in the pulley was again visualized. A 5/8 inch 30-gauge  needle was used to access the flexor tendon sheath.  A small amount of fluid was injected seen to distend the tendon sheath. 10 mg of Depo-Medrol and 0.4 mL of Marcaine were injected. Patient tolerated procedure well

## 2013-04-22 NOTE — Addendum Note (Signed)
Addended by: Jacki Cones C on: 04/22/2013 05:34 PM   Modules accepted: Orders

## 2013-04-22 NOTE — Assessment & Plan Note (Signed)
Patient has symptoms suspicious for carpal tunnel syndrome. Additionally she is now large median nerve syndrome musculoskeletal ultrasound Plan:  Cockup wrist splint Followup in 4 weeks. If not improving would consider ultrasound guided median nerve hydrodissection

## 2013-05-14 ENCOUNTER — Other Ambulatory Visit: Payer: Self-pay | Admitting: Physician Assistant

## 2013-05-15 ENCOUNTER — Other Ambulatory Visit: Payer: Self-pay | Admitting: Emergency Medicine

## 2013-05-27 ENCOUNTER — Ambulatory Visit (INDEPENDENT_AMBULATORY_CARE_PROVIDER_SITE_OTHER): Payer: Medicare Other | Admitting: Sports Medicine

## 2013-05-27 VITALS — BP 130/86 | Ht 64.0 in | Wt 234.0 lb

## 2013-05-27 DIAGNOSIS — M6530 Trigger finger, unspecified finger: Secondary | ICD-10-CM

## 2013-05-27 DIAGNOSIS — G5600 Carpal tunnel syndrome, unspecified upper limb: Secondary | ICD-10-CM

## 2013-05-27 DIAGNOSIS — G5710 Meralgia paresthetica, unspecified lower limb: Secondary | ICD-10-CM

## 2013-05-27 DIAGNOSIS — G5601 Carpal tunnel syndrome, right upper limb: Secondary | ICD-10-CM

## 2013-05-27 NOTE — Patient Instructions (Signed)
For your Carpal Tunnel: Continue using the cockup splint, especially at night. Get a soft rubber ball and place hand in warm water and squueeze ball to help work out the swelling in your hands.   For your trigger finger: Continue to work with your doctor for good Thyroid control.

## 2013-05-27 NOTE — Assessment & Plan Note (Signed)
   stable at the current time continues to use gabapentin

## 2013-05-27 NOTE — Assessment & Plan Note (Signed)
Much improved after injection She needs to follow her for any recurrent symptoms  She will have less symptoms with good control of her thyroid

## 2013-05-27 NOTE — Assessment & Plan Note (Signed)
Testing shows a 25% reduction in strength  I think she can continue doing conservative care with cock-up splint and start some grip exercises with a softball in warm water  In 3 months if it does not improve we can consider doing injection

## 2013-05-27 NOTE — Progress Notes (Signed)
Patient ID: Veronica Martinez, female   DOB: [birth year], 57 y.o.   MRN: [id number] S: Pt had trigger finger injection in R 4th finger about 1 month ago. She notes since that time she has had great improvement and finger is no longer triggering.  Pt also has carpal tunnel syndrome in the right hand. She had been wearing the cock up splint with continued improvement. Still some tingling between 3rd and 4th fingers, some improvement in this. No other tingling. Pain with striking of wrist.   O: Positive Augmented Phalen in R, Negative on Left.  Finger strength decreased on R in Median distribution L 2nd 19lb, 3rd 18lb, 4th 12lb. R 2nd 14lb, 3rd 14lb, and 4th 13lb.    A:  Trigger finger symptoms resolved with injection Carpal tunnel: improved with conservative treatment.   P:  Carpal Tunnel: Continue to wear cock up splint especially at night. Soft rubber ball in warm water exercises to reduce swelling. Will defer injection as Pt progressing, will consider if improvement stops or strength decreases.  Trigger finger: continued Thyroid management with PCP.

## 2013-06-28 ENCOUNTER — Other Ambulatory Visit: Payer: Self-pay | Admitting: Sports Medicine

## 2013-08-09 ENCOUNTER — Ambulatory Visit (INDEPENDENT_AMBULATORY_CARE_PROVIDER_SITE_OTHER): Payer: Medicare Other | Admitting: Family Medicine

## 2013-08-09 ENCOUNTER — Encounter: Payer: Self-pay | Admitting: Family Medicine

## 2013-08-09 ENCOUNTER — Ambulatory Visit
Admission: RE | Admit: 2013-08-09 | Discharge: 2013-08-09 | Disposition: A | Discharge status: Home / Self Care | Payer: Medicare Other | Source: Ambulatory Visit | Attending: Family Medicine | Admitting: Family Medicine

## 2013-08-09 VITALS — BP 148/89 | HR 101 | Ht 64.0 in | Wt 234.0 lb

## 2013-08-09 DIAGNOSIS — E669 Obesity, unspecified: Secondary | ICD-10-CM

## 2013-08-09 DIAGNOSIS — R269 Unspecified abnormalities of gait and mobility: Secondary | ICD-10-CM

## 2013-08-09 DIAGNOSIS — M25551 Pain in right hip: Secondary | ICD-10-CM | POA: Insufficient documentation

## 2013-08-09 DIAGNOSIS — Z96649 Presence of unspecified artificial hip joint: Secondary | ICD-10-CM

## 2013-08-09 DIAGNOSIS — M25559 Pain in unspecified hip: Secondary | ICD-10-CM

## 2013-08-09 MED ORDER — DICLOFENAC SODIUM 75 MG PO TBEC: 75.0000 mg | DELAYED_RELEASE_TABLET | Freq: Two times a day (BID) | ORAL | Status: DC

## 2013-08-09 NOTE — Patient Instructions (Addendum)
Please get your x rays. I will send you a note about them. If there is something worrisome, I will call you Take the diclofenac twice a day with food. See me in two weeks. Have a great vacation!

## 2013-08-09 NOTE — Progress Notes (Signed)
  Subjective:    Patient ID: Veronica Martinez, female    DOB: 1956-01-13, 57 y.o.   MRN: [id number]  HPI  Patient is a very pleasant 57 year old African American female complaining of right-sided lateral thigh pain for the past 3 days. She describes this pain as cramping and sharp in nature. She experiences this pain sporadically and acutely. She localizes this pain as the lateral aspects of her right thigh at the level of the midshaft. Pain does not radiate. She denies suffering any injury. She denies any swelling, ecchymoses, erythema. She denies any weakness or numbness. She believes that the pain diminishes with "walking it off". She says that it's ibuprofen helps slightly. She does not know what exacerbates the pain.  She does report that she received bilateral hip replacements do to complications of AVN about a decade ago. To this point she has experienced no complications from these procedures, with the exception of unilateral meralgia paresthetica of the right leg. She states that today's pain is different from the symptoms she experienced from meralgia paresthetica. PMH:  - Meralgia paresthetica  - Carpal tunnel syndrome, right  - Hip pain, bilateral  - Back pain, lumbar  - Gait abnormality  - Obesity  - Myalgia  - Trigger finger Meds:  - Amitriptyline 25 mg  - Benzonatate 200 mg  - Vitamin D 2000 units  - Crestor 10 mg  - Flonase 50 mcg nasal spray  - Gabapentin 600 mg  - Protonix 40 mg  - Synthroid 112 mcg Allergies:  - Meloxicam  - Pravastatin sodium  - Simvastatin  - Tramadol  Review of Systems Denies fever, chills, headache, cough, shortness of breath.    Objective:   Physical Exam General: Patient is very pleasant obese woman who appears her stated age. Alert, oriented, no acute distress. Vitals: Reviewed Hip: Full ROM. Strength 4/5 bilaterally. Sensation intact bilaterally. Patellar reflexes +2 bilaterally. Pelvic alignment unremarkable to inspection and palpation.  No tenderness to palpation. Mild pain with FABER and FADIR. No SI joint tenderness.     Assessment & Plan:  1) right-sided hip/leg pain - Orders for x-ray of the hips/pelvis were placed - Diclofenac 75 mg twice a day 2) followup: 2 weeks - If no progress is made: Consider joint injection, further imaging, and/or orthopedic consult   Note was dictated by Mickie Hillier MS4, under the supervision of Dr. Jennette Kettle  [facility] Attending Note: I have seen and examined this patient. I have discussed this patient with the resident and reviewed the assessment and plan as documented above. I agree with the resident's findings and plan.  Given her pertinent PMH of B THR for avascular necrosis I think we should check x rays. She had some fairly recently by her orthopedist, but she was NOT having pain at the time, so I think we must repeat those. No signs of fever or anything to indicate infection.

## 2013-08-10 DIAGNOSIS — Z96649 Presence of unspecified artificial hip joint: Secondary | ICD-10-CM | POA: Insufficient documentation

## 2013-08-25 ENCOUNTER — Encounter: Payer: Self-pay | Admitting: Family Medicine

## 2013-08-27 ENCOUNTER — Ambulatory Visit (INDEPENDENT_AMBULATORY_CARE_PROVIDER_SITE_OTHER): Payer: Medicare Other | Admitting: Family Medicine

## 2013-08-27 VITALS — BP 133/79 | Ht 64.0 in | Wt 234.0 lb

## 2013-08-27 DIAGNOSIS — M25551 Pain in right hip: Secondary | ICD-10-CM

## 2013-08-27 DIAGNOSIS — M25559 Pain in unspecified hip: Secondary | ICD-10-CM

## 2013-08-27 NOTE — Progress Notes (Signed)
  Subjective:    Patient ID: Veronica Martinez, female    DOB: 16-Sep-1956, 57 y.o.   MRN: [id number]  HPI  Followup right thigh pain. We had done x-rays to rule out loosening of prosthesis in this patient who has had bilateral hip replacements. X-rays look fine. Diclofenac I gave her has improved her pain about 40%. Pain is still located in the right mid thigh. It is nontender to palpation. It worsens with about 40 minutes of standing. He improved somewhat with rest. It does not really get worse with walking.  Review of Systems Denies fever, sweats, chills. No lower extremity numbness or swelling.    Objective:   Physical Exam Vital signs are reviewed GENERAL: Well-developed overweight female no acute distress THIGH: Nontender to palpation in the area of reported pain. She has full range of motion in flexion extension. She has normal strength in her quadriceps.       Assessment & Plan:  #1. Thigh pain. Very unclear to what this is. She's had moderate improvement with NSAIDs. I don't really want continued as long-term but will continue this for at least another 3 or 4 weeks and then I would recommend she get back in with her orthopedist who did her hip replacements if the pain is not resolved by then.

## 2013-09-07 ENCOUNTER — Ambulatory Visit (INDEPENDENT_AMBULATORY_CARE_PROVIDER_SITE_OTHER): Payer: Medicare Other | Admitting: Emergency Medicine

## 2013-09-07 ENCOUNTER — Ambulatory Visit (INDEPENDENT_AMBULATORY_CARE_PROVIDER_SITE_OTHER): Payer: Medicare Other | Admitting: Sports Medicine

## 2013-09-07 VITALS — BP 134/69 | HR 85 | Temp 98.2°F | Resp 16 | Ht 64.0 in | Wt 268.0 lb

## 2013-09-07 VITALS — BP 137/79 | Ht 64.0 in | Wt 200.0 lb

## 2013-09-07 DIAGNOSIS — E785 Hyperlipidemia, unspecified: Secondary | ICD-10-CM

## 2013-09-07 DIAGNOSIS — R739 Hyperglycemia, unspecified: Secondary | ICD-10-CM

## 2013-09-07 DIAGNOSIS — G5600 Carpal tunnel syndrome, unspecified upper limb: Secondary | ICD-10-CM

## 2013-09-07 DIAGNOSIS — R7309 Other abnormal glucose: Secondary | ICD-10-CM

## 2013-09-07 DIAGNOSIS — Z23 Encounter for immunization: Secondary | ICD-10-CM

## 2013-09-07 DIAGNOSIS — E039 Hypothyroidism, unspecified: Secondary | ICD-10-CM

## 2013-09-07 DIAGNOSIS — G5601 Carpal tunnel syndrome, right upper limb: Secondary | ICD-10-CM

## 2013-09-07 DIAGNOSIS — I10 Essential (primary) hypertension: Secondary | ICD-10-CM

## 2013-09-07 LAB — COMPREHENSIVE METABOLIC PANEL
ALT: 37 U/L — ABNORMAL HIGH (ref 0–35)
AST: 29 U/L (ref 0–37)
Albumin: 4.4 g/dL (ref 3.5–5.2)
Alkaline Phosphatase: 89 U/L (ref 39–117)
BUN: 11 mg/dL (ref 6–23)
CO2: 28 mEq/L (ref 19–32)
Calcium: 9.7 mg/dL (ref 8.4–10.5)
Chloride: 105 mEq/L (ref 96–112)
Creat: 0.85 mg/dL (ref 0.50–1.10)
Glucose, Bld: 112 mg/dL — ABNORMAL HIGH (ref 70–99)
Potassium: 4.2 mEq/L (ref 3.5–5.3)
Sodium: 141 mEq/L (ref 135–145)
Total Bilirubin: 0.5 mg/dL (ref 0.3–1.2)
Total Protein: 7.3 g/dL (ref 6.0–8.3)

## 2013-09-07 LAB — LIPID PANEL
Cholesterol: 155 mg/dL (ref 0–200)
HDL: 86 mg/dL (ref >39)
LDL Cholesterol: 54 mg/dL (ref 0–99)
Total CHOL/HDL Ratio: 1.8 Ratio
Triglycerides: 75 mg/dL (ref <150)
VLDL: 15 mg/dL (ref 0–40)

## 2013-09-07 LAB — GLUCOSE, POCT (MANUAL RESULT ENTRY): POC Glucose: 116 mg/dl — AB (ref 70–99)

## 2013-09-07 LAB — POCT GLYCOSYLATED HEMOGLOBIN (HGB A1C): Hemoglobin A1C: 6.1

## 2013-09-07 NOTE — Patient Instructions (Signed)
You are borderline diabetic at present. Your A1c was 6.1 please be careful about your diet

## 2013-09-07 NOTE — Progress Notes (Signed)
  Subjective:    Patient ID: Veronica Martinez, female    DOB: 1956/10/15, 57 y.o.   MRN: [id number]  HPI patient here for followup of hyperlipidemia. She's currently on Crestor for control of this. She also is on thyroid replacement. Her last blood sugar was slightly elevated and needs followup. She has been seeing the sports medicine specialist because of right leg and hip pain.    Review of Systems     Objective:   Physical Exam patient is alert and cooperative in no distress. Her neck is supple. Chest was clear to auscultation percussion. Heart regular rate no murmurs rubs or gallops appreciated abdomen is soft liver and spleen not enlarged there are no areas of tenderness her extremities are without edema. Results for orders placed in visit on 09/07/13  GLUCOSE, POCT (MANUAL RESULT ENTRY)      Result Value Range   POC Glucose 116 (*) 70 - 99 mg/dl  POCT GLYCOSYLATED HEMOGLOBIN (HGB A1C)      Result Value Range   Hemoglobin A1C 6.1           Assessment & Plan:  Pressures under good control. She is seen in the sports medicine specialist regarding her leg and hip discomfort. Lipid panel was done today. Hemoglobin A1c was done today because of mild elevation in her sugar on her last comprehensive medical panel.

## 2013-09-07 NOTE — Progress Notes (Signed)
  Subjective:    Patient ID: Veronica Martinez, female    DOB: December 13, 1956, 57 y.o.   MRN: [id number]  HPI Veronica Martinez is a 57 year old female who is here for right hand follow-up. She was last seen a few months ago at which time she was followed up for a right 4th digit trigger finger that had been injected, and for right sided carpal tunnel syndrome. At that time, it was decided since her symptoms were improving with a cock up splint, she would continue conservative treatment for her CTS. Today, she states that she is now starting to have worsening numbness and tingling on the radial side of her 4th digit, and her 3rd digit, going all the way up to the tips of her fingers. She has been using her cock up splint every night, and had previously been on anti-inflammatories, but her pain is progressing to about a 7/10.   She also feels a small nodule at the base of her 4th digit where she had her trigger finger injection, and that area is slightly painful at times. She is overall triggering less.   Review of Systems     Objective:   Physical Exam  Constitutional: She is oriented to person, place, and time. She appears well-developed and well-nourished.  HENT:  Head: Normocephalic and atraumatic.  Eyes: Conjunctivae are normal.  Pulmonary/Chest: Effort normal.  Neurological: She is alert and oriented to person, place, and time.  Psychiatric: She has a normal mood and affect. Her behavior is normal.   Right hand: tenderness to palpation at area of 4th MCP joint, with small nodule palpable. +Tinels, + Phalen's,  + compression test on right side. Normal ROM and strength.   Ultrasound:  Right median nerve enlarged with 0.22 cm2 area;  This is nearly twice normal volume. On longitudinal scan there is an hourglass sign.  There is thickeining of palmar ligament. Small nodule at flexor tendon sheath at 4th digit    Assessment & Plan:   Right carpal tunnel syndrome  -Injection of carpal tunnel with  hydrodissection done today with ultrasound guidance - F/U in 1 month

## 2013-09-07 NOTE — Assessment & Plan Note (Signed)
Procedure:  Injection of Consent obtained and verified. Time-out conducted. Noted no overlying erythema, induration, or other signs of local infection. Skin prepped in a sterile fashion. Topical analgesic spray: Ethyl chloride. Completed without difficulty. Wheal with 1% lidocaine. Then from ulnar side of wrist we injected trans palmar ligament; then we penetrated the transpalmar ligament and hydrodissected the medial nerve to free it from adhesions.  This was documented on [place] scanning. Meds: Kenalog 10 mgm and 1 cc lidocine 1% Pain immediately improved suggesting accurate placement of the medication. Advised to call if fevers/chills, erythema, induration, drainage, or persistent bleeding.  Reck and repeat scan in 1 month

## 2013-09-08 ENCOUNTER — Other Ambulatory Visit: Payer: Self-pay | Admitting: Ophthalmology

## 2013-09-08 LAB — TSH: TSH: 3.965 u[IU]/mL (ref 0.350–4.500)
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
## 2013-09-10 ENCOUNTER — Encounter (HOSPITAL_COMMUNITY): Payer: Self-pay | Admitting: Pharmacy Technician

## 2013-09-16 NOTE — Pre-Procedure Instructions (Signed)
Veronica Martinez  09/16/2013   Your procedure is scheduled on:  Wednesday, October 1st  Report to Main Entrance "A" at 1015 AM.  Call this number if you have problems the morning of surgery: [phone]   Remember:   Do not eat food or drink liquids after midnight.   Take these medicines the morning of surgery with A SIP OF WATER: Synthroid, Protonix, flonase   Do not wear jewelry, make-up or nail polish.  Do not wear lotions, powders, or perfumes. You may wear deodorant.  Do not shave 48 hours prior to surgery. Men may shave face and neck.  Do not bring valuables to the hospital.  [facility] is not responsible   for any belongings or valuables.               Contacts, dentures or bridgework may not be worn into surgery.  Leave suitcase in the car. After surgery it may be brought to your room.  For patients admitted to the hospital, discharge time is determined by your  treatment team.               Patients discharged the day of surgery will not be allowed to drive home.   Special Instructions: Shower using CHG 2 nights before surgery and the night before surgery.  If you shower the day of surgery use CHG.  Use special wash - you have one bottle of CHG for all showers.  You should use approximately 1/3 of the bottle for each shower.   Please read over the following fact sheets that you were given: Pain Booklet, Coughing and Deep Breathing and Surgical Site Infection Prevention

## 2013-09-17 ENCOUNTER — Encounter (HOSPITAL_COMMUNITY): Payer: Self-pay

## 2013-09-17 ENCOUNTER — Encounter (HOSPITAL_COMMUNITY)
Admission: RE | Admit: 2013-09-17 | Discharge: 2013-09-17 | Disposition: A | Discharge status: Home / Self Care | Payer: Medicare Other | Source: Ambulatory Visit | Attending: Ophthalmology | Admitting: Ophthalmology

## 2013-09-17 DIAGNOSIS — Z01818 Encounter for other preprocedural examination: Secondary | ICD-10-CM | POA: Insufficient documentation

## 2013-09-17 DIAGNOSIS — Z01812 Encounter for preprocedural laboratory examination: Secondary | ICD-10-CM | POA: Insufficient documentation

## 2013-09-17 HISTORY — DX: Personal history of other endocrine, nutritional and metabolic disease: Z86.39

## 2013-09-17 HISTORY — DX: Personal history of irradiation: Z92.3

## 2013-09-17 HISTORY — DX: Pure hypercholesterolemia, unspecified: E78.00

## 2013-09-17 HISTORY — DX: Gastro-esophageal reflux disease without esophagitis: K21.9

## 2013-09-17 HISTORY — DX: Hypothyroidism, unspecified: E03.9

## 2013-09-17 LAB — CBC
HCT: 39.5 % (ref 36.0–46.0)
Hemoglobin: 13.2 g/dL (ref 12.0–15.0)
MCH: 31.3 pg (ref 26.0–34.0)
MCHC: 33.4 g/dL (ref 30.0–36.0)
MCV: 93.6 fL (ref 78.0–100.0)
Platelets: 169 10*3/uL (ref 150–400)
RBC: 4.22 MIL/uL (ref 3.87–5.11)
RDW: 14.7 % (ref 11.5–15.5)
WBC: 6.4 10*3/uL (ref 4.0–10.5)

## 2013-09-21 MED ORDER — PREDNISOLONE ACETATE 1 % OP SUSP
1.0000 [drp] | OPHTHALMIC | Status: AC
  Administered 2013-09-22: 1 [drp] via OPHTHALMIC
  Filled 2013-09-21: qty 5

## 2013-09-21 MED ORDER — TETRACAINE HCL 0.5 % OP SOLN
2.0000 [drp] | OPHTHALMIC | Status: AC
  Administered 2013-09-22: 2 [drp] via OPHTHALMIC
  Filled 2013-09-21: qty 2

## 2013-09-21 MED ORDER — GATIFLOXACIN 0.5 % OP SOLN
1.0000 [drp] | OPHTHALMIC | Status: AC | PRN
  Administered 2013-09-22 (×3): 1 [drp] via OPHTHALMIC
  Filled 2013-09-21: qty 2.5

## 2013-09-21 MED ORDER — PHENYLEPHRINE HCL 2.5 % OP SOLN
1.0000 [drp] | OPHTHALMIC | Status: AC | PRN
  Administered 2013-09-22 (×3): 1 [drp] via OPHTHALMIC
  Filled 2013-09-21: qty 2

## 2013-09-21 NOTE — Progress Notes (Signed)
Nurse called patient and instructed her to arrive at 0915 for surgery tomorrow. Patient verbalized understanding.

## 2013-09-22 ENCOUNTER — Ambulatory Visit (HOSPITAL_COMMUNITY): Payer: Medicare Other | Admitting: Anesthesiology

## 2013-09-22 ENCOUNTER — Encounter (HOSPITAL_COMMUNITY): Payer: Self-pay | Admitting: *Deleted

## 2013-09-22 ENCOUNTER — Encounter (HOSPITAL_COMMUNITY): Payer: Self-pay | Admitting: Anesthesiology

## 2013-09-22 ENCOUNTER — Ambulatory Visit (HOSPITAL_COMMUNITY)
Admission: RE | Admit: 2013-09-22 | Discharge: 2013-09-22 | Disposition: A | Discharge status: Home / Self Care | Payer: Medicare Other | Source: Ambulatory Visit | Attending: Ophthalmology | Admitting: Ophthalmology

## 2013-09-22 ENCOUNTER — Encounter (HOSPITAL_COMMUNITY)
Admission: RE | Disposition: A | Discharge status: Home / Self Care | Payer: Self-pay | Source: Ambulatory Visit | Attending: Ophthalmology

## 2013-09-22 DIAGNOSIS — H2510 Age-related nuclear cataract, unspecified eye: Secondary | ICD-10-CM | POA: Insufficient documentation

## 2013-09-22 HISTORY — PX: CATARACT EXTRACTION W/PHACO: SHX586

## 2013-09-22 SURGERY — PHACOEMULSIFICATION, CATARACT, WITH IOL INSERTION
Anesthesia: General | Site: Eye | Laterality: Right | Wound class: Clean

## 2013-09-22 MED ORDER — CEFAZOLIN SUBCONJUNCTIVAL INJECTION 100 MG/0.5 ML
200.0000 mg | INJECTION | SUBCONJUNCTIVAL | Status: DC
  Filled 2013-09-22: qty 1

## 2013-09-22 MED ORDER — BSS IO SOLN
INTRAOCULAR | Status: AC
  Filled 2013-09-22: qty 500

## 2013-09-22 MED ORDER — ONDANSETRON HCL 4 MG/2ML IJ SOLN
INTRAMUSCULAR | Status: DC | PRN
  Administered 2013-09-22: 4 mg via INTRAVENOUS

## 2013-09-22 MED ORDER — NEOSTIGMINE METHYLSULFATE 1 MG/ML IJ SOLN
INTRAMUSCULAR | Status: DC | PRN
  Administered 2013-09-22: 5 mg via INTRAVENOUS

## 2013-09-22 MED ORDER — FENTANYL CITRATE 0.05 MG/ML IJ SOLN
25.0000 ug | INTRAMUSCULAR | Status: DC | PRN
  Administered 2013-09-22 (×2): 50 ug via INTRAVENOUS

## 2013-09-22 MED ORDER — CEFAZOLIN SUBCONJUNCTIVAL INJECTION 100 MG/0.5 ML
INJECTION | SUBCONJUNCTIVAL | Status: DC | PRN
  Administered 2013-09-22: 200 mg via SUBCONJUNCTIVAL

## 2013-09-22 MED ORDER — NA CHONDROIT SULF-NA HYALURON 40-30 MG/ML IO SOLN
INTRAOCULAR | Status: DC | PRN
  Administered 2013-09-22: 0.75 mL via INTRAOCULAR

## 2013-09-22 MED ORDER — SODIUM CHLORIDE 0.9 % IV SOLN
INTRAVENOUS | Status: DC | PRN
  Administered 2013-09-22 (×2): via INTRAVENOUS

## 2013-09-22 MED ORDER — SODIUM CHLORIDE 0.9 % IV SOLN: INTRAVENOUS | Status: DC

## 2013-09-22 MED ORDER — NA CHONDROIT SULF-NA HYALURON 40-30 MG/ML IO SOLN
INTRAOCULAR | Status: AC
  Filled 2013-09-22: qty 0.5

## 2013-09-22 MED ORDER — LIDOCAINE HCL (CARDIAC) 20 MG/ML IV SOLN
INTRAVENOUS | Status: DC | PRN
  Administered 2013-09-22: 100 mg via INTRAVENOUS

## 2013-09-22 MED ORDER — SODIUM HYALURONATE 10 MG/ML IO SOLN
INTRAOCULAR | Status: AC
  Filled 2013-09-22: qty 0.85

## 2013-09-22 MED ORDER — FENTANYL CITRATE 0.05 MG/ML IJ SOLN
INTRAMUSCULAR | Status: AC
  Administered 2013-09-22: 50 ug via INTRAVENOUS
  Filled 2013-09-22: qty 2

## 2013-09-22 MED ORDER — BUPIVACAINE HCL (PF) 0.75 % IJ SOLN
INTRAMUSCULAR | Status: AC
  Filled 2013-09-22: qty 10

## 2013-09-22 MED ORDER — LIDOCAINE HCL 2 % IJ SOLN
INTRAMUSCULAR | Status: AC
  Filled 2013-09-22: qty 20

## 2013-09-22 MED ORDER — ROCURONIUM BROMIDE 100 MG/10ML IV SOLN
INTRAVENOUS | Status: DC | PRN
  Administered 2013-09-22: 40 mg via INTRAVENOUS

## 2013-09-22 MED ORDER — TRIAMCINOLONE ACETONIDE 40 MG/ML IJ SUSP
INTRAMUSCULAR | Status: AC
  Filled 2013-09-22: qty 5

## 2013-09-22 MED ORDER — EPINEPHRINE HCL 1 MG/ML IJ SOLN
INTRAOCULAR | Status: DC | PRN
  Administered 2013-09-22: 12:00:00

## 2013-09-22 MED ORDER — BACITRACIN-POLYMYXIN B 500-10000 UNIT/GM OP OINT
TOPICAL_OINTMENT | OPHTHALMIC | Status: DC | PRN
  Administered 2013-09-22: 1 via OPHTHALMIC

## 2013-09-22 MED ORDER — DEXAMETHASONE SODIUM PHOSPHATE 10 MG/ML IJ SOLN
INTRAMUSCULAR | Status: DC | PRN
  Administered 2013-09-22: 10 mg

## 2013-09-22 MED ORDER — PROVISC 10 MG/ML IO SOLN
INTRAOCULAR | Status: DC | PRN
  Administered 2013-09-22: 8.5 mg via INTRAOCULAR

## 2013-09-22 MED ORDER — ACETAZOLAMIDE SODIUM 500 MG IJ SOLR
INTRAMUSCULAR | Status: AC
  Filled 2013-09-22: qty 500

## 2013-09-22 MED ORDER — FENTANYL CITRATE 0.05 MG/ML IJ SOLN
INTRAMUSCULAR | Status: DC | PRN
  Administered 2013-09-22: 50 ug via INTRAVENOUS

## 2013-09-22 MED ORDER — HYPROMELLOSE (GONIOSCOPIC) 2.5 % OP SOLN
OPHTHALMIC | Status: AC
  Filled 2013-09-22: qty 15

## 2013-09-22 MED ORDER — MIDAZOLAM HCL 5 MG/5ML IJ SOLN
INTRAMUSCULAR | Status: DC | PRN
  Administered 2013-09-22: 1 mg via INTRAVENOUS

## 2013-09-22 MED ORDER — DEXAMETHASONE SODIUM PHOSPHATE 10 MG/ML IJ SOLN
INTRAMUSCULAR | Status: AC
  Filled 2013-09-22: qty 1

## 2013-09-22 MED ORDER — GLYCOPYRROLATE 0.2 MG/ML IJ SOLN
INTRAMUSCULAR | Status: DC | PRN
  Administered 2013-09-22: .6 mg via INTRAVENOUS

## 2013-09-22 MED ORDER — HYPROMELLOSE (GONIOSCOPIC) 2.5 % OP SOLN
OPHTHALMIC | Status: DC | PRN
  Administered 2013-09-22: 2 [drp] via OPHTHALMIC

## 2013-09-22 MED ORDER — PROPOFOL 10 MG/ML IV BOLUS
INTRAVENOUS | Status: DC | PRN
  Administered 2013-09-22: 140 mg via INTRAVENOUS

## 2013-09-22 MED ORDER — EPINEPHRINE HCL 1 MG/ML IJ SOLN
INTRAMUSCULAR | Status: AC
  Filled 2013-09-22: qty 1

## 2013-09-22 MED ORDER — BACITRACIN-POLYMYXIN B 500-10000 UNIT/GM OP OINT
TOPICAL_OINTMENT | OPHTHALMIC | Status: AC
  Filled 2013-09-22: qty 3.5

## 2013-09-22 SURGICAL SUPPLY — 64 items
APL SRG 3 HI ABS STRL LF PLS (MISCELLANEOUS) ×1
APPLICATOR COTTON TIP 6IN STRL (MISCELLANEOUS) ×2 IMPLANT
APPLICATOR DR MATTHEWS STRL (MISCELLANEOUS) ×2 IMPLANT
BLADE EYE MINI 60D BEAVER (BLADE) IMPLANT
BLADE KERATOME 2.75 (BLADE) ×2 IMPLANT
BLADE STAB KNIFE 15DEG (BLADE) IMPLANT
CANNULA ANTERIOR CHAMBER 27GA (MISCELLANEOUS) IMPLANT
CLOTH BEACON ORANGE TIMEOUT ST (SAFETY) ×2 IMPLANT
COVER MAYO STAND STRL (DRAPES) ×2 IMPLANT
DRAPE OPHTHALMIC 77X100 STRL (CUSTOM PROCEDURE TRAY) ×2 IMPLANT
DRAPE POUCH INSTRU U-SHP 10X18 (DRAPES) ×2 IMPLANT
DRSG TEGADERM 4X4.75 (GAUZE/BANDAGES/DRESSINGS) ×2 IMPLANT
FILTER BLUE MILLIPORE (MISCELLANEOUS) IMPLANT
GLOVE BIOGEL PI IND STRL 7.5 (GLOVE) IMPLANT
GLOVE BIOGEL PI INDICATOR 7.5 (GLOVE) ×1
GLOVE SS BIOGEL STRL SZ 6.5 (GLOVE) ×1 IMPLANT
GLOVE SUPERSENSE BIOGEL SZ 6.5 (GLOVE) ×1
GLOVE SURG SS PI 6.5 STRL IVOR (GLOVE) ×1 IMPLANT
GLOVE SURG SS PI 7.5 STRL IVOR (GLOVE) ×1 IMPLANT
GOWN SRG XL XLNG 56XLVL 4 (GOWN DISPOSABLE) ×1 IMPLANT
GOWN STRL NON-REIN LRG LVL3 (GOWN DISPOSABLE) ×2 IMPLANT
GOWN STRL NON-REIN XL XLG LVL4 (GOWN DISPOSABLE) ×2
KIT BASIN OR (CUSTOM PROCEDURE TRAY) ×2 IMPLANT
KIT ROOM TURNOVER OR (KITS) ×2 IMPLANT
KNIFE GRIESHABER SHARP 2.5MM (MISCELLANEOUS) ×2 IMPLANT
LENS IOL ACRYSOF MP POST 22.0 (Intraocular Lens) ×1 IMPLANT
MASK EYE SHIELD (GAUZE/BANDAGES/DRESSINGS) ×1 IMPLANT
NDL 18GX1X1/2 (RX/OR ONLY) (NEEDLE) IMPLANT
NDL 25GX 5/8IN NON SAFETY (NEEDLE) ×2 IMPLANT
NDL FILTER BLUNT 18X1 1/2 (NEEDLE) IMPLANT
NDL HYPO 30X.5 LL (NEEDLE) ×2 IMPLANT
NEEDLE 18GX1X1/2 (RX/OR ONLY) (NEEDLE) IMPLANT
NEEDLE 22X1 1/2 (OR ONLY) (NEEDLE) IMPLANT
NEEDLE 25GX 5/8IN NON SAFETY (NEEDLE) ×4 IMPLANT
NEEDLE FILTER BLUNT 18X 1/2SAF (NEEDLE)
NEEDLE FILTER BLUNT 18X1 1/2 (NEEDLE) IMPLANT
NEEDLE HYPO 30X.5 LL (NEEDLE) ×4 IMPLANT
NS IRRIG 1000ML POUR BTL (IV SOLUTION) ×2 IMPLANT
PACK CATARACT CUSTOM (CUSTOM PROCEDURE TRAY) ×2 IMPLANT
PAD ARMBOARD 7.5X6 YLW CONV (MISCELLANEOUS) ×2 IMPLANT
PAD EYE OVAL STERILE LF (GAUZE/BANDAGES/DRESSINGS) ×1 IMPLANT
PAK PIK CVS CATARACT (OPHTHALMIC) ×2 IMPLANT
PROBE ANTERIOR 20G W/INFUS NDL (MISCELLANEOUS) IMPLANT
ROLLS DENTAL (MISCELLANEOUS) IMPLANT
SHUTTLE MONARCH TYPE A (NEEDLE) ×2 IMPLANT
SPEAR EYE SURG WECK-CEL (MISCELLANEOUS) IMPLANT
SUT ETHILON 10-0 CS-B-6CS-B-6 (SUTURE)
SUT ETHILON 5 0 P 3 18 (SUTURE)
SUT ETHILON 9 0 TG140 8 (SUTURE) IMPLANT
SUT NYLON ETHILON 5-0 P-3 1X18 (SUTURE) IMPLANT
SUT PLAIN 6 0 TG1408 (SUTURE) IMPLANT
SUT POLY NON ABSORB 10-0 8 STR (SUTURE) IMPLANT
SUT VICRYL 6 0 S 29 12 (SUTURE) IMPLANT
SUTURE EHLN 10-0 CS-B-6CS-B-6 (SUTURE) IMPLANT
SYR 20CC LL (SYRINGE) IMPLANT
SYR 5ML LL (SYRINGE) IMPLANT
SYR TB 1ML LUER SLIP (SYRINGE) IMPLANT
SYRINGE 10CC LL (SYRINGE) IMPLANT
TAPE CLOTH SURG 4X10 WHT LF (GAUZE/BANDAGES/DRESSINGS) ×1 IMPLANT
TIP ABS 45DEG FLARED 0.9MM (TIP) ×2 IMPLANT
TOWEL OR 17X24 6PK STRL BLUE (TOWEL DISPOSABLE) ×4 IMPLANT
WATER STERILE IRR 1000ML POUR (IV SOLUTION) ×2 IMPLANT
WIPE INSTRUMENT ADHESIVE BACK (MISCELLANEOUS) ×2 IMPLANT
WIPE INSTRUMENT VISIWIPE 73X73 (MISCELLANEOUS) ×2 IMPLANT

## 2013-09-22 NOTE — Anesthesia Postprocedure Evaluation (Signed)
  Anesthesia Post-op Note  Patient: Veronica Martinez  Procedure(s) Performed: Procedure(s): CATARACT EXTRACTION PHACO AND INTRAOCULAR LENS PLACEMENT (IOC) (Right)  Patient Location: PACU  Anesthesia Type:General  Level of Consciousness: awake, alert  and oriented  Airway and Oxygen Therapy: Patient Spontanous Breathing and Patient connected to nasal cannula oxygen  Post-op Pain: mild  Post-op Assessment: Post-op Vital signs reviewed, Patient's Cardiovascular Status Stable, Respiratory Function Stable, Patent Airway and No signs of Nausea or vomiting  Post-op Vital Signs: Reviewed and stable  Complications: No apparent anesthesia complications

## 2013-09-22 NOTE — Transfer of Care (Signed)
Immediate Anesthesia Transfer of Care Note  Patient: Veronica Martinez  Procedure(s) Performed: Procedure(s): CATARACT EXTRACTION PHACO AND INTRAOCULAR LENS PLACEMENT (IOC) (Right)  Patient Location: PACU  Anesthesia Type:General  Level of Consciousness: awake, sedated and patient cooperative  Airway & Oxygen Therapy: Patient Spontanous Breathing and Patient connected to face mask oxygen  Post-op Assessment: Report given to PACU RN, Post -op Vital signs reviewed and stable and Patient moving all extremities  Post vital signs: Reviewed and stable  Complications: No apparent anesthesia complications

## 2013-09-22 NOTE — Op Note (Signed)
OLEVA KOO 09/22/2013 Cataract: Combined, Nuclear  Procedure: Phacoemulsification, Posterior Chamber Intra-ocular Lens Operative Eye:  right eye  Surgeon: Shade Flood Estimated Blood Loss: minimal Specimens for Pathology:  None Complications: none  The patient was prepared and draped in the usual manner for ocular surgery on the right eye. A Cook lid speculum was placed. A peripheral clear corneal incision was made at the surgical limbus centered at the 11:00 meridian. A separate clear corneal stab incision was made with a 15 degree blade at the 2:00 meridian to permit bi-manual technique. Viscoat and  Provisc as an underlying layer next to the capsule was instilled into the anterior chamber through that incision.  A keratome was used to create a self sealing incision entering the anterior chamber at the 11:00 meridian. A capsulorhexis was performed using a bent 25g needle. The lens was hydrodissected and the nucleus was hydrodilineated using a Nichammin cannula. The Chang chopper was inserted and used to rotate the lens to insure adequate lens mobility. The phacoemulsification handpiece was inserted and a combined phaco-chop technique was employed, fracturing the lens into separate sections with subsequent removal with the phaco handpiece.   The I/A cannula was used to remove remaining lens cortex. Provisc was instilled and used to deepen the anterior chamber and posterior capsule bag. The Monarch injector was used to place a folded Acrysof MA50BM PC IOL, + 22.00  diopters, into the capsule bag. A Sinskey lens hook was used to dial in the trailing haptic.  The I/A cannula was used to remove the viscoelastic from the anterior chamber. BSS was used to bring IOP to the desired range and the wound was checked to insure it was watertight. Subconjunctival injections of Ancef 100/0.17ml and Dexamethasone 0.5 ml of a 10mg /37ml solution were placed without complication. The lid speculum and drapes were  removed and the patient's eye was patched with Polymixin/Bacitracin ophthalmic ointment. An eye shield was placed and the patient was transferred alert and conversant from the operating room to the post-operative recovery area.   Shade Flood, MD

## 2013-09-22 NOTE — Anesthesia Preprocedure Evaluation (Signed)
Anesthesia Evaluation  Patient identified by MRN, date of birth, ID band Patient awake    Reviewed: Allergy & Precautions, H&P , NPO status , Patient's Chart, lab work & pertinent test results  Airway Mallampati: II TM Distance: >3 FB Neck ROM: Full    Dental no notable dental hx. (+) Teeth Intact and Dental Advisory Given   Pulmonary neg pulmonary ROS,  breath sounds clear to auscultation  Pulmonary exam normal       Cardiovascular negative cardio ROS  Rhythm:Regular Rate:Normal     Neuro/Psych negative neurological ROS  negative psych ROS   GI/Hepatic Neg liver ROS, GERD-  Medicated and Controlled,  Endo/Other  Hypothyroidism Morbid obesity  Renal/GU negative Renal ROS  negative genitourinary   Musculoskeletal   Abdominal   Peds  Hematology negative hematology ROS (+)   Anesthesia Other Findings   Reproductive/Obstetrics negative OB ROS                           Anesthesia Physical Anesthesia Plan  ASA: III  Anesthesia Plan: General   Post-op Pain Management:    Induction: Intravenous  Airway Management Planned: Oral ETT  Additional Equipment:   Intra-op Plan:   Post-operative Plan: Extubation in OR  Informed Consent: I have reviewed the patients History and Physical, chart, labs and discussed the procedure including the risks, benefits and alternatives for the proposed anesthesia with the patient or authorized representative who has indicated his/her understanding and acceptance.   Dental advisory given  Plan Discussed with: CRNA  Anesthesia Plan Comments:         Anesthesia Quick Evaluation
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
## 2013-09-22 NOTE — Interval H&P Note (Signed)
History and Physical Interval Note:  09/22/2013 9:53 AM  Veronica Martinez  has presented today for surgery, with the diagnosis of Combined Cataract Right Eye  The various methods of treatment have been discussed with the patient and family. After consideration of risks, benefits and other options for treatment, the patient has consented to  Procedure(s): CATARACT EXTRACTION PHACO AND INTRAOCULAR LENS PLACEMENT (IOC) (Right) as a surgical intervention .  The patient's history has been reviewed, patient examined, no change in status, stable for surgery.  I have reviewed the patient's chart and labs.  Questions were answered to the patient's satisfaction.     Mercedes Fort, Waynette Buttery

## 2013-09-22 NOTE — Anesthesia Procedure Notes (Signed)
Procedure Name: Intubation Date/Time: 09/22/2013 11:29 AM Performed by: Coralee Rud Pre-anesthesia Checklist: Patient identified, Emergency Drugs available, Suction available and Patient being monitored Patient Re-evaluated:Patient Re-evaluated prior to inductionOxygen Delivery Method: Circle system utilized Preoxygenation: Pre-oxygenation with 100% oxygen Intubation Type: IV induction Ventilation: Mask ventilation without difficulty Laryngoscope Size: Miller and 3 Grade View: Grade III Tube type: Oral Tube size: 7.5 mm Number of attempts: 2 Leisure centre manager attempted x 1  I atempted x2) Airway Equipment and Method: Stylet Placement Confirmation: ETT inserted through vocal cords under direct vision,  positive ETCO2 and breath sounds checked- equal and bilateral Secured at: 22 cm Tube secured with: Tape Dental Injury: Injury to lip

## 2013-09-24 ENCOUNTER — Encounter (HOSPITAL_COMMUNITY): Payer: Self-pay | Admitting: Ophthalmology

## 2013-09-29 ENCOUNTER — Encounter: Payer: Self-pay | Admitting: Sports Medicine

## 2013-09-29 ENCOUNTER — Ambulatory Visit (INDEPENDENT_AMBULATORY_CARE_PROVIDER_SITE_OTHER): Payer: Medicare Other | Admitting: Sports Medicine

## 2013-09-29 VITALS — BP 126/74 | HR 80 | Ht 64.0 in | Wt 269.0 lb

## 2013-09-29 DIAGNOSIS — M6530 Trigger finger, unspecified finger: Secondary | ICD-10-CM

## 2013-09-29 DIAGNOSIS — G5600 Carpal tunnel syndrome, unspecified upper limb: Secondary | ICD-10-CM

## 2013-09-29 DIAGNOSIS — G5601 Carpal tunnel syndrome, right upper limb: Secondary | ICD-10-CM

## 2013-09-29 NOTE — Assessment & Plan Note (Signed)
Injection around 2 months ago that has not alleviated her Sx too much over the past 3-4 weeks.  She is now having locking/catching of her 4th digit on the R hand about daily, causing her significant discomfort.  With two previous injections to the area, will refer to Dr. Janee Morn at [facility] for formal evaluation and possible A1 Pulley release.

## 2013-09-29 NOTE — Patient Instructions (Signed)
DR Mariel Sleet ORTHO [address] [place] THUR[place] 09/30/13 @ 1230PM [phone]

## 2013-09-29 NOTE — Assessment & Plan Note (Signed)
Pt seen about 3 weeks ago for atypical Sx of R handed carpal tunnel, at which time she had injection to the area.  Pt has not noticed any relief at this time despite night time cock up splints that she has continued.  Denies any weakness or loss of function at this point.  However, again, since she has had multiple injections to the carpal tunnel on this hand, will refer to Dr. Janee Morn at [facility] for formal evaluation.

## 2013-09-29 NOTE — Progress Notes (Signed)
Veronica Martinez is a 57 y.o. female who presents today for f/u of R hand carpal tunnel and trigger finger of the 4th digit.  Patient was last seen about 3 weeks ago for her carpal tunnel syndrome, at which point she had an injection done under ultrasound guidance.  As well, she has continued to use the cock up splint at night.  With both of these modalities, she has not noticed a vast improvement, stating that she is about the same as she was three weeks ago.Still complaining of numbness/tingling on the radial aspect of her fourth digit, as well as the medial aspect of her third digit.  She denies any weakness or trouble with her grip strength.In regards to the trigger finger at the right fourth digit, she had an injection done about 2 months ago receiving about 4 week's worth of symptom relief.  Over the last month, she noticed that her symptoms have returned and has noticed that her finger has started to catch to the point now it happens multiple times per day.     Current Outpatient Prescriptions on File Prior to Visit  Medication Sig Dispense Refill  . amitriptyline (ELAVIL) 25 MG tablet Take 25 mg by mouth at bedtime.      . benzonatate (TESSALON) 200 MG capsule Take 200 mg by mouth 3 (three) times daily as needed. Take one tablet 3 times a day as needed for cough      . Cholecalciferol (VITAMIN D) 2000 UNITS CAPS Take 2,000 Units by mouth daily.       [name] co-enzyme Q-10 30 MG capsule Take 75 mg by mouth 3 (three) times daily.      . diclofenac (VOLTAREN) 75 MG EC tablet Take 75 mg by mouth 2 (two) times daily.      . fluticasone (FLONASE) 50 MCG/ACT nasal spray Place 2 sprays into the nose daily.      [name] gabapentin (NEURONTIN) 600 MG tablet Take 600 mg by mouth 3 (three) times daily.      [name] levothyroxine (SYNTHROID, LEVOTHROID) 112 MCG tablet Take 112 mcg by mouth daily before breakfast.      . Misc Natural Products (CALCIUM PLUS ADVANCED PO) Take 600 mg by mouth 2 (two) times daily.       .  Multiple Vitamin (MULTIVITAMIN) tablet Take 1 tablet by mouth daily.       . Omega-3 Fatty Acids (FISH OIL) 1200 MG CAPS Take 1,200 mg by mouth 3 (three) times daily.       . pantoprazole (PROTONIX) 40 MG tablet Take 40 mg by mouth daily.       . rosuvastatin (CRESTOR) 10 MG tablet Take 5 mg by mouth daily.       No current facility-administered medications on file prior to visit.    ROS: Per HPI.  All other systems reviewed and are negative.   Physical Exam Filed Vitals:   09/29/13 0839  BP: 126/74  Pulse: 80    Physical Examination:  Right hand: tenderness to palpation at area of 4th MCP joint, with small nodule palpable. +Tinels, + Phalen's, + compression test on right side. Normal ROM and strength.   Lab Results  Component Value Date   TSH 3.965 09/07/2013

## 2013-10-02 ENCOUNTER — Other Ambulatory Visit: Payer: Self-pay | Admitting: Family Medicine

## 2013-10-19 ENCOUNTER — Ambulatory Visit (INDEPENDENT_AMBULATORY_CARE_PROVIDER_SITE_OTHER): Payer: Medicare Other | Admitting: Sports Medicine

## 2013-10-19 ENCOUNTER — Encounter: Payer: Self-pay | Admitting: Sports Medicine

## 2013-10-19 VITALS — BP 148/83 | HR 96 | Ht 64.0 in | Wt 269.0 lb

## 2013-10-19 DIAGNOSIS — M25551 Pain in right hip: Secondary | ICD-10-CM

## 2013-10-19 DIAGNOSIS — M25559 Pain in unspecified hip: Secondary | ICD-10-CM

## 2013-10-19 DIAGNOSIS — Z96649 Presence of unspecified artificial hip joint: Secondary | ICD-10-CM

## 2013-10-19 MED ORDER — AMITRIPTYLINE HCL 25 MG PO TABS: 25.0000 mg | ORAL_TABLET | Freq: Every day | ORAL | Status: DC

## 2013-10-19 MED ORDER — DICLOFENAC SODIUM 75 MG PO TBEC: 75.0000 mg | DELAYED_RELEASE_TABLET | Freq: Two times a day (BID) | ORAL | Status: DC

## 2013-10-19 NOTE — Patient Instructions (Signed)
We are going to treat this like meralgia parasthetica: - start taking amytriptiline at night time, starting tonight - start taking diclofenac twice a day with food, starting today  If this doesn't help and you continue to have worsening pain, please call Dr. Marchia Meiers office at [facility] to see him to make sure this is not from your hip.

## 2013-10-19 NOTE — Progress Notes (Signed)
  Subjective:    Patient ID: Veronica Martinez, female    DOB: [birth year], 57 y.o.   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   Pain starts in the groin and radiates to the anterior aspect of the thigh. She reports a constant ache with intermittent sharp shooting pain. Pain is worst with standing or sitting for long periods of time. Not particularly worst with walking. Worst with bending of the hip and better with straightening of the hip. Feels mild weakness in the right leg compared to the left. No numbness. No tingling.   She started noticing a muscle tightness of her anterior thigh on Saturday after a day of shopping. She used ice and tylenol and had improvement of pain the next day. On Monday, she went shopping again and was walking and on her feet. At the end of the day, she started having a sharp shooting pain. Pain lasted through the night. She has not tried anything for it. She takes gabapentin 600mg  tid for meralgia paresthetica and diclofenac. She doesn't think she takes amytriptiline. She states that this pain is similar to a previous episode of meralgia paresthetica she has had a couple of years ago. She states that last time she had a meralgia parasthetica episode, she was debilitated, in bed for 2 months.   Review of Systems Negative except per HPI    Objective:   Physical Exam Filed Vitals:   10/19/13 1021  BP: 148/83  Pulse: 96  Height: 5\' 4"  (1.626 m)  Weight: 269 lb (122.018 kg)   General: alert, crying from pain, very pleasant African-American woman Hip: normal external and internal rotation of the hip without significant pain, 90 degrees of flexion of hip on right, 110 on left. Negative FABER bilaterally, no ASIS joint tenderness 5/5 hip flexion strength on left and 4+/5 strength on right, 5/5 abduction strength bilaterally Tenderness to palpation  along anterior lateral aspect of thigh, no numbness to light touch  Clinical Data: Bilateral hip pain. 08/09/13 BILATERAL HIP WITH PELVIS - 4+ VIEW  Comparison: None  Findings: Bilateral total hip replacements are identified.  There is no evidence of acute fracture, subluxation or dislocation.  No hardware complicating features are present.  No other significant abnormalities are identified.  IMPRESSION:  No evidence of acute abnormality.  Bilateral total hip replacements without complicating features.       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  Reviewed hip and femur xrays from 08/09/13 which did not show any loosening of the prosthesis bilaterally.  Pain appears very debilitating to be from meralgia paresthetica but since patient states that it is similar to previous episodes, will treat as such. If worsening pain or not improved with medication, patient was advised to see Dr. Elita Quick at [facility] for further evaluation of her prosthesis.    Plan:  - start amytriptiline 25mg  at bedtime - start back on diclofenac bid for pain relief - return to Dr. Elita Quick if not improved in the next couple of days.   Marena Chancy, PGY-3 Family Medicine Resident

## 2013-11-24 ENCOUNTER — Ambulatory Visit (INDEPENDENT_AMBULATORY_CARE_PROVIDER_SITE_OTHER): Payer: Medicare Other | Admitting: Emergency Medicine

## 2013-11-24 ENCOUNTER — Telehealth: Payer: Self-pay | Admitting: *Deleted

## 2013-11-24 VITALS — BP 144/88 | HR 86 | Temp 98.2°F | Resp 18 | Ht 65.0 in | Wt 265.2 lb

## 2013-11-24 DIAGNOSIS — G5711 Meralgia paresthetica, right lower limb: Secondary | ICD-10-CM

## 2013-11-24 DIAGNOSIS — G5710 Meralgia paresthetica, unspecified lower limb: Secondary | ICD-10-CM

## 2013-11-24 NOTE — Telephone Encounter (Signed)
Message copied by Jacki Cones C on Wed Nov 24, 2013  2:11 PM ------      Message from: CERESI, MELANIE L      Created: Wed Nov 24, 2013  8:16 AM      Regarding: phone message      Contact: [phone]       Pt daughter called to states that mom has jury duty now.  She would like a letter excusing her from jury duty today.      She was getting cramps going up the stairs in the courthouse. ------

## 2013-11-24 NOTE — Telephone Encounter (Signed)
Asked Dr. Darrick Penna about doing jury duty excuse letter.  Per Dr. Darrick Penna advised pt that this would not meet criteria for having her medically excused, thus he does not feel the need to do a letter.

## 2013-11-24 NOTE — Progress Notes (Signed)
[facility] [address], [place] [address] [phone]  Date:  11/24/2013   Name:  Veronica Martinez   DOB:  March 30, 1956   MRN:  [id number]  PCP:  Donnetta Hail, MD    Chief Complaint: Groin Pain and Orlan Leavens a letter for jury duties   History of Present Illness:  Veronica Martinez is a 57 y.o. very pleasant female patient who presents with the following:  Patient went to jury duty today and was unable to sit still due to pain.  Has no history of injury or overuse.  Needs an excuse for jury duty.  No improvement with over the counter medications or other home remedies.   Patient Active Problem List   Diagnosis Date Noted  . 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 Right hip pain 08/09/2013  . Trigger finger, acquired 04/22/2013  . Carpal tunnel syndrome of right wrist 04/22/2013  . Pain of right heel 12/08/2012  . Myalgia 11/10/2012  . OBESITY, UNSPECIFIED 02/20/2011  . BACK PAIN, LUMBAR 07/05/2010  . ABNORMALITY OF GAIT 05/02/2010  . MERALGIA PARESTHETICA 03/02/2010  . HIP PAIN, BILATERAL 03/02/2010    Past Medical History  Diagnosis Date  . Allergy   . History of Graves' disease   . Hyperthyroidism   . Hx of radioactive iodine thyroid ablation   . Hypothyroidism   . GERD (gastroesophageal reflux disease)   . High cholesterol     Past Surgical History  Procedure Laterality Date  . Shoulder surgery    . Appendectomy    . Joint replacement    . Abdominal hysterectomy    . Hip arthroplasty Bilateral   . Other surgical history Right     carpal tunel injection  . Cataract extraction w/phaco Right 09/22/2013    Procedure: CATARACT EXTRACTION PHACO AND INTRAOCULAR LENS PLACEMENT (IOC);  Surgeon: Shade Flood, MD;  Location: [facility] OR;  Service: Ophthalmology;  Laterality: Right;  . Eye surgery      History  Substance Use Topics  . Smoking status: Never Smoker   . Smokeless tobacco: Not on file  . Alcohol Use: No    No family history on  file.  Allergies  Allergen Reactions  . Meloxicam   . Pravastatin Sodium   . Simvastatin   . Tramadol Hcl     Medication list has been reviewed and updated.  Current Outpatient Prescriptions on File Prior to Visit  Medication Sig Dispense Refill  . amitriptyline (ELAVIL) 25 MG tablet Take 1 tablet (25 mg total) by mouth at bedtime.  30 tablet  1  . Cholecalciferol (VITAMIN D) 2000 UNITS CAPS Take 2,000 Units by mouth daily.       [name] co-enzyme Q-10 30 MG capsule Take 75 mg by mouth 3 (three) times daily.      . fluticasone (FLONASE) 50 MCG/ACT nasal spray Place 2 sprays into the nose daily.      [name] gabapentin (NEURONTIN) 600 MG tablet Take 600 mg by mouth 3 (three) times daily.      [name] levothyroxine (SYNTHROID, LEVOTHROID) 112 MCG tablet Take 112 mcg by mouth daily before breakfast.      . Misc Natural Products (CALCIUM PLUS ADVANCED PO) Take 600 mg by mouth 2 (two) times daily.       . Multiple Vitamin (MULTIVITAMIN) tablet Take 1 tablet by mouth daily.       . Omega-3 Fatty Acids (FISH OIL) 1200 MG CAPS Take 1,200 mg by mouth 3 (  three) times daily.       . pantoprazole (PROTONIX) 40 MG tablet Take 40 mg by mouth daily.       . rosuvastatin (CRESTOR) 10 MG tablet Take 5 mg by mouth daily.      . diclofenac (VOLTAREN) 75 MG EC tablet Take 1 tablet (75 mg total) by mouth 2 (two) times daily.  60 tablet  0   No current facility-administered medications on file prior to visit.    Review of Systems:  As per HPI, otherwise negative.    Physical Examination: Filed Vitals:   11/24/13 1749  BP: 144/88  Pulse: 86  Temp: 98.2 F (36.8 C)  Resp: 18   Filed Vitals:   11/24/13 1749  Height: 5\' 5"  (1.651 m)  Weight: 265 lb 3.2 oz (120.294 kg)   Body mass index is 44.13 kg/(m^2). Ideal Body Weight: Weight in (lb) to have BMI = 25: 149.9   GEN: obese, NAD, Non-toxic, Alert & Oriented x 3 HEENT: Atraumatic, Normocephalic.  Ears and Nose: No external deformity. EXTR: No  clubbing/cyanosis/edema NEURO: antalgic gait.  PSYCH: Normally interactive. Conversant. Not depressed or anxious appearing.  Calm demeanor.    Assessment and Plan: Chronic leg pain   Signed,  Phillips Odor, MD

## 2013-11-26 ENCOUNTER — Other Ambulatory Visit: Payer: Self-pay | Admitting: Ophthalmology

## 2013-11-26 NOTE — H&P (Signed)
Patient Record  Martinez, Veronica  A. Patient Number:  [id number] Date of Birth:  [birth year] Age:  57 years old    Gender:  Female Date of Evaluation:  November 25, 2013  Chief Complaint:   57 year old female patient for  2 wk Cataract OD follow up. History of Present Illness:   57 yo female for follow up cataract surgery od.  Surgery done 09/22/13 by Dr. Clarisa Kindred.  On pred forte qid.  No pain or discomfort.  No pus or mucus. Presents for post op evaluation. Past History:  Allergies:  Ultram (tramadol) (swelling), Active Medications:   Ocular Medications:  Ofloxacin 1gtt OD QID Prednisolone 1gtt OD QID Other Medications:  Vitamin D3 (cholecalciferol (vitamin d3)) by Nadyne Coombes, capsule 2,000 unit, Protonix (pantoprazole) by Nadyne Coombes, granules DR for susp in packet 40 mg, multivitamin (multivitamin) by Nadyne Coombes, capsule, Fish Oil (omega-3 fatty acids-fish oil) by Nadyne Coombes, capsule,delayed release(DR/EC) 360-1,200 mg, Co Q-10 (coenzyme q10) by Nadyne Coombes, capsule 30 mg, Pred Forte (prednisolone acetate) by Nadyne Coombes, drops,suspension 1% Apply 1 drop in right eye four times a day, 10 ml, Start Date:10/04/2013, Ocuflox (ofloxacin) by Nadyne Coombes, drops 0.3% 1 drop in right eye four times a day, 5 ml, Start Date:10/04/2013, omega 3-dha-epa-fish oil (omega 3-dha-epa-fish oil) by Nadyne Coombes, capsule 1,200 (144-216) mg 1 capsule by mouth three times a day as directed capsule, Calcium Plus (calcium carbonate-vit d3-min) by Nadyne Coombes, tablet 600 mg calcium- 400 unit 2 tablet by mouth once a day as directed tablet, diclofenac sodium (diclofenac sodium) by Nadyne Coombes, tablet,delayed release (DR/EC) 75 mg 1 tablet by mouth twice a day as directed tablet, cholecalciferol (vitamin D3) (cholecalciferol (vitamin d3)) by Nadyne Coombes, capsule 2,000 unit 2 capsule by mouth as directed capsule, albuterol sulfate (albuterol sulfate) by  Nadyne Coombes, HFA aerosol inhaler 90 mcg/actuation 2 puff as directed puff, Synthroid (levothyroxine) by Nadyne Coombes, tablet 112 mcg 1 tablet by mouth once a day as directed tablet, pantoprazole (pantoprazole) by Nadyne Coombes, tablet,delayed release (DR/EC) 20 mg 1 tablet by mouth once a day as directed tablet, gabapentin (gabapentin) by Nadyne Coombes, tablet 600 mg 1 tablet by mouth three times a day as directed tablet, Flovent Diskus (fluticasone) by Nadyne Coombes, blister with device 50 mcg/actuation 2 puff as directed, Crestor (rosuvastatin) by Nadyne Coombes, tablet 10 mg 1 tablet by mouth once a day tablet, amitriptyline (amitriptyline) by Nadyne Coombes, tablet 25 mg 1 tablet by mouth at bedtime as directed tablet, benzonatate (benzonatate) by Nadyne Coombes, capsule 200 mg 1 capsule by mouth three times a day as directed capsule Birth History:  none Past Ocular History:   Cataracts Past Medical History:   Hip arthropathy Shoulder impingement Carpel tunnel Past Surgical History:   Bilateral hip replacement Impingment shoulder surgery Carpel tunnel injections Family History:  no amblyopia (grandfather), + blindness, + cataracts (grandfather), no crossed eyes, no diabetic retinopathy, no glaucoma, no macular degeneration, no retinal detachment, no cancer, no diabetes, no heart disease, + high blood pressure (mother), + stroke (grandmother) Social History:   Smoking Status: never smoker  Alcohol:  none   Driving status:  driving Review of Systems:   All other systems are negative.  Examination:  Visual Acuity:   Distance VA [place]:  OD: 20/30-1    OS: 20/50-1 IOP:  OD:  26     OS:  14    @ 10:05AM (Goldmann applanation) Manifest Refraction:    Sphere    Cyl Axis       VA         Add       VA Prism Base R:  +0.50  -1.25  180    20/30       +2.75                      L:  +0.50  -0.50   90    20/50       +2.75                       comments: 10/19/13   Confrontation visual field:  OU:  Normal  Motility:  OU:  Normal  Pupils:  OU:  Shape, size, direct and consensual reaction normal  Adnexa:  Preauricular LN, lacrimal drainage, lacrimal glands, orbit normal  Eyelids:  Eyelids:  normal Conjunctiva:  OD:  modest sub-conj heme, injected OU:  bulbar, palpebral normal  Cornea:  OD:  spk OU:  epithelium, stroma, endothelium, tear film normal  Anterior Chamber:  OD:  +1 cell and flare OS:  depth normal, no cell, no flare  Iris:  OU:  normal  Lens:  OD:  PC IOL in good position; 1+ posterior capsular opacity, 1+ posterior capsular wrinkling OS:  1+ nuclear sclerosis, 3+ cortical cataract  Vitreous:  OU:  normal  Optic Disc:  OD:  cupping: 0.6 x 0.55  OS:  cupping: 0.6 x 0.5  OU:  size, C/D ratio, appearance, NFL normal  Macula:  OU:  normal  Vessels:  OU:  normal  Periphery:  OU:  normal  Orientation to person, place and time:  Normal  Mood and affect:  Normal  Impression:  366.19  Combined Cataract OS 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 I concur with her having a visually significant Cataract OS.   Will plan surgery for 12/29/2013  Medications:   zymaxid qid until gone Continue pred forte qid Patient Instructions: Call immediately if experience decreased vision, halos, red eye, or pain. Decrease pink top to 2 x day. Use blue top 2 x day for pressure. Return to clinic:  Jnauary 8th 4:15 PM   (electronically signed)  Waynette Buttery L. Clarisa Kindred, MD

## 2013-11-29 ENCOUNTER — Telehealth: Payer: Self-pay | Admitting: Radiology

## 2013-11-29 ENCOUNTER — Other Ambulatory Visit: Payer: Self-pay | Admitting: Emergency Medicine

## 2013-11-29 NOTE — Telephone Encounter (Signed)
error 

## 2013-12-04 ENCOUNTER — Other Ambulatory Visit: Payer: Self-pay | Admitting: Sports Medicine

## 2013-12-04 ENCOUNTER — Other Ambulatory Visit: Payer: Self-pay | Admitting: Family Medicine

## 2013-12-09 ENCOUNTER — Ambulatory Visit (INDEPENDENT_AMBULATORY_CARE_PROVIDER_SITE_OTHER): Payer: Medicare Other | Admitting: Emergency Medicine

## 2013-12-09 VITALS — BP 140/80 | HR 86 | Temp 98.7°F | Resp 18 | Ht 64.0 in | Wt 264.0 lb

## 2013-12-09 DIAGNOSIS — R0981 Nasal congestion: Secondary | ICD-10-CM

## 2013-12-09 DIAGNOSIS — R059 Cough, unspecified: Secondary | ICD-10-CM

## 2013-12-09 DIAGNOSIS — J019 Acute sinusitis, unspecified: Secondary | ICD-10-CM

## 2013-12-09 DIAGNOSIS — J3489 Other specified disorders of nose and nasal sinuses: Secondary | ICD-10-CM

## 2013-12-09 DIAGNOSIS — R05 Cough: Secondary | ICD-10-CM

## 2013-12-09 LAB — POCT INFLUENZA A/B
Influenza A, POC: NEGATIVE
Influenza B, POC: NEGATIVE

## 2013-12-09 MED ORDER — AMOXICILLIN 875 MG PO TABS: 875.0000 mg | ORAL_TABLET | Freq: Two times a day (BID) | ORAL | Status: DC

## 2013-12-09 MED ORDER — BENZONATATE 100 MG PO CAPS: 100.0000 mg | ORAL_CAPSULE | Freq: Three times a day (TID) | ORAL | Status: DC | PRN

## 2013-12-09 NOTE — Progress Notes (Addendum)
Subjective:  This chart was scribed for Lesle Chris, MD by Carl Best, Medical Scribe. This patient was seen in Room 11 and the patient's care was started at 12:48 PM.   Patient ID: Veronica Martinez, female    DOB: 08-Jul-1956, 57 y.o.   MRN: [id number]  HPI HPI Comments: Veronica Martinez is a 57 y.o. female who presents to the [facility] complaining of a constant sore throat, postnasal drip, and cough productive of beige sputum that started 10 days ago.  She states that she feels as though most of her symptoms are coming from her sinuses.  She states that the mucous from her nose has been yellow in color.  She lists intermittent headache as an associated symptom.  She denies fever as an associated symptom.  The patient states that she started taking Mucinex with no relief to her symptoms. She states that her grandchildren have been sick.     Past Medical History  Diagnosis Date  . Allergy   . History of Graves' disease   . Hyperthyroidism   . Hx of radioactive iodine thyroid ablation   . Hypothyroidism   . GERD (gastroesophageal reflux disease)   . High cholesterol    Past Surgical History  Procedure Laterality Date  . Shoulder surgery    . Appendectomy    . Joint replacement    . Abdominal hysterectomy    . Hip arthroplasty Bilateral   . Other surgical history Right     carpal tunel injection  . Cataract extraction w/phaco Right 09/22/2013    Procedure: CATARACT EXTRACTION PHACO AND INTRAOCULAR LENS PLACEMENT (IOC);  Surgeon: Shade Flood, MD;  Location: [facility] OR;  Service: Ophthalmology;  Laterality: Right;  . Eye surgery     No family history on file. History   Social History  . Marital Status: Married    Spouse Name: N/A    Number of Children: N/A  . Years of Education: N/A   Occupational History  . Not on file.   Social History Main Topics  . Smoking status: Never Smoker   . Smokeless tobacco: Not on file  . Alcohol Use: No  . Drug Use: No  .  Sexual Activity: Yes    Partners: Male   Other Topics Concern  . Not on file   Social History Narrative   Married. Education: [employer].    Allergies  Allergen Reactions  . Meloxicam   . Pravastatin Sodium   . Simvastatin   . Tramadol Hcl      Review of Systems  Constitutional: Negative for fever.  HENT: Positive for postnasal drip and sore throat.   Respiratory: Positive for cough.   Neurological: Positive for headaches (intermittent).  All other systems reviewed and are negative.     Objective:  Physical Exam Physical Exam  Nursing note and vitals reviewed. Constitutional: He is oriented to person, place, and time. He appears well-developed and well-nourished. No distress.  HENT she has significant nasal congestion with rhinorrhea. TMs are clear throat is somewhat red:  Head: Normocephalic and atraumatic.  Eyes: Conjunctivae and EOM are normal. Pupils are equal, round, and reactive to light.  Neck: Neck supple.  Cardiovascular: Normal rate.   Pulmonary/Chest: Effort normal.  the lung fields are clear to auscultation and percussion  Neurological: He is alert and oriented to person, place, and time. No cranial nerve deficit.  Psychiatric: He has a normal mood and affect. His behavior is normal.  Results for  orders placed in visit on 12/09/13  POCT INFLUENZA A/B      Result Value Range   Influenza A, POC Negative     Influenza B, POC Negative         Assessment & Plan:  Flu  tests are negative we'll treat with amoxicillin twice a day along with Tessalon Perles for cough I personally performed the services described in this documentation, which was scribed in my presence. The recorded information has been reviewed and is accurate.

## 2013-12-20 ENCOUNTER — Encounter (HOSPITAL_COMMUNITY): Payer: Self-pay | Admitting: Pharmacy Technician

## 2013-12-24 ENCOUNTER — Encounter (HOSPITAL_COMMUNITY)
Admission: RE | Admit: 2013-12-24 | Discharge: 2013-12-24 | Disposition: A | Discharge status: Home / Self Care | Payer: Medicare Other | Source: Ambulatory Visit | Attending: Ophthalmology | Admitting: Ophthalmology

## 2013-12-24 ENCOUNTER — Encounter (HOSPITAL_COMMUNITY): Payer: Self-pay

## 2013-12-24 DIAGNOSIS — Z01812 Encounter for preprocedural laboratory examination: Secondary | ICD-10-CM | POA: Insufficient documentation

## 2013-12-24 DIAGNOSIS — Z01818 Encounter for other preprocedural examination: Secondary | ICD-10-CM | POA: Insufficient documentation

## 2013-12-24 HISTORY — DX: Headache: R51

## 2013-12-24 LAB — CBC
HCT: 40 % (ref 36.0–46.0)
Hemoglobin: 13.1 g/dL (ref 12.0–15.0)
MCH: 30.8 pg (ref 26.0–34.0)
MCHC: 32.8 g/dL (ref 30.0–36.0)
MCV: 93.9 fL (ref 78.0–100.0)
Platelets: 192 10*3/uL (ref 150–400)
RBC: 4.26 MIL/uL (ref 3.87–5.11)
RDW: 13.8 % (ref 11.5–15.5)
WBC: 4.7 10*3/uL (ref 4.0–10.5)

## 2013-12-24 LAB — BASIC METABOLIC PANEL
BUN: 11 mg/dL (ref 6–23)
CO2: 31 mEq/L (ref 19–32)
Calcium: 9.8 mg/dL (ref 8.4–10.5)
Chloride: 101 mEq/L (ref 96–112)
Creatinine, Ser: 0.79 mg/dL (ref 0.50–1.10)
GFR calc Af Amer: >90 mL/min (ref >90)
GFR calc non Af Amer: 89 mL/min — ABNORMAL LOW (ref >90)
Glucose, Bld: 141 mg/dL — ABNORMAL HIGH (ref 70–99)
Potassium: 4.5 mEq/L (ref 3.7–5.3)
Sodium: 142 mEq/L (ref 137–147)

## 2013-12-24 NOTE — Pre-Procedure Instructions (Signed)
MELIYAH SIMON  12/24/2013   Your procedure is scheduled on:  January 7 th at 1150 AM  Report to [place] Entrance "A" at [phone] AM.  Call this number if you have problems the morning of surgery: [phone]   Remember:   Do not eat food or drink liquids after midnight.   Take these medicines the morning of surgery with A SIP OF WATER: Synthroid,Protonix and Flonase nasal spray    Do not wear jewelry, make-up or nail polish.  Do not wear lotions, powders, or perfumes. You may wear deodorant.  Do not shave 48 hours prior to surgery.   Do not bring valuables to the hospital.  [facility] is not responsible  for any belongings or valuables.               Contacts, dentures or bridgework may not be worn into surgery.  Leave suitcase in the car. After surgery it may be brought to your room.  For patients admitted to the hospital, discharge time is determined by your  treatment team.               Patients discharged the day of surgery will not be allowed to drive home.    Special Instructions: Shower using CHG 2 nights before surgery and the night before surgery.  If you shower the day of surgery use CHG.  Use special wash - you have one bottle of CHG for all showers.  You should use approximately 1/3 of the bottle for each shower.   Please read over the following fact sheets that you were given: Pain Booklet, Coughing and Deep Breathing and Surgical Site Infection Prevention

## 2013-12-27 NOTE — Progress Notes (Signed)
Note from [facility] group arrived and placed in chart.  There is no stress test info on this patient.  DA

## 2013-12-28 ENCOUNTER — Other Ambulatory Visit: Payer: Self-pay | Admitting: Emergency Medicine

## 2013-12-29 ENCOUNTER — Ambulatory Visit (HOSPITAL_COMMUNITY): Payer: Medicare Other | Admitting: Anesthesiology

## 2013-12-29 ENCOUNTER — Encounter (HOSPITAL_COMMUNITY): Payer: Medicare Other | Admitting: Anesthesiology

## 2013-12-29 ENCOUNTER — Ambulatory Visit (HOSPITAL_COMMUNITY)
Admission: RE | Admit: 2013-12-29 | Discharge: 2013-12-29 | Disposition: A | Discharge status: Home / Self Care | Payer: Medicare Other | Source: Ambulatory Visit | Attending: Ophthalmology | Admitting: Ophthalmology

## 2013-12-29 ENCOUNTER — Encounter (HOSPITAL_COMMUNITY)
Admission: RE | Disposition: A | Discharge status: Home / Self Care | Payer: Self-pay | Source: Ambulatory Visit | Attending: Ophthalmology

## 2013-12-29 DIAGNOSIS — H259 Unspecified age-related cataract: Secondary | ICD-10-CM | POA: Insufficient documentation

## 2013-12-29 DIAGNOSIS — I10 Essential (primary) hypertension: Secondary | ICD-10-CM | POA: Insufficient documentation

## 2013-12-29 DIAGNOSIS — E039 Hypothyroidism, unspecified: Secondary | ICD-10-CM | POA: Insufficient documentation

## 2013-12-29 DIAGNOSIS — K219 Gastro-esophageal reflux disease without esophagitis: Secondary | ICD-10-CM | POA: Insufficient documentation

## 2013-12-29 HISTORY — PX: CATARACT EXTRACTION W/PHACO: SHX586

## 2013-12-29 SURGERY — PHACOEMULSIFICATION, CATARACT, WITH IOL INSERTION
Anesthesia: General | Site: Eye | Laterality: Left

## 2013-12-29 MED ORDER — LIDOCAINE HCL (CARDIAC) 20 MG/ML IV SOLN
INTRAVENOUS | Status: DC | PRN
  Administered 2013-12-29: 60 mg via INTRAVENOUS

## 2013-12-29 MED ORDER — OXYCODONE HCL 5 MG/5ML PO SOLN: 5.0000 mg | Freq: Once | ORAL | Status: DC | PRN

## 2013-12-29 MED ORDER — LIDOCAINE HCL 2 % IJ SOLN
INTRAMUSCULAR | Status: DC | PRN
  Administered 2013-12-29: 13:00:00 via RETROBULBAR

## 2013-12-29 MED ORDER — SUCCINYLCHOLINE CHLORIDE 20 MG/ML IJ SOLN
INTRAMUSCULAR | Status: DC | PRN
  Administered 2013-12-29: 120 mg via INTRAVENOUS

## 2013-12-29 MED ORDER — TETRACAINE HCL 0.5 % OP SOLN
2.0000 [drp] | OPHTHALMIC | Status: AC
  Administered 2013-12-29: 2 [drp] via OPHTHALMIC
  Filled 2013-12-29: qty 2

## 2013-12-29 MED ORDER — GATIFLOXACIN 0.5 % OP SOLN
OPHTHALMIC | Status: AC
  Administered 2013-12-29: 1 [drp] via OPHTHALMIC
  Filled 2013-12-29: qty 2.5

## 2013-12-29 MED ORDER — CYCLOPENTOLATE HCL 1 % OP SOLN
OPHTHALMIC | Status: AC
  Administered 2013-12-29: 1 [drp] via OPHTHALMIC
  Filled 2013-12-29: qty 2

## 2013-12-29 MED ORDER — PREDNISOLONE ACETATE 1 % OP SUSP
1.0000 [drp] | OPHTHALMIC | Status: AC
  Administered 2013-12-29: 1 [drp] via OPHTHALMIC
  Filled 2013-12-29: qty 5

## 2013-12-29 MED ORDER — CYCLOPENTOLATE HCL 1 % OP SOLN
1.0000 [drp] | OPHTHALMIC | Status: AC | PRN
  Administered 2013-12-29 (×3): 1 [drp] via OPHTHALMIC

## 2013-12-29 MED ORDER — ONDANSETRON HCL 4 MG/2ML IJ SOLN
INTRAMUSCULAR | Status: DC | PRN
  Administered 2013-12-29: 4 mg via INTRAVENOUS

## 2013-12-29 MED ORDER — CEFAZOLIN SUBCONJUNCTIVAL INJECTION 100 MG/0.5 ML
200.0000 mg | INJECTION | SUBCONJUNCTIVAL | Status: DC
  Filled 2013-12-29: qty 1

## 2013-12-29 MED ORDER — OXYCODONE HCL 5 MG PO TABS: 5.0000 mg | ORAL_TABLET | Freq: Once | ORAL | Status: DC | PRN

## 2013-12-29 MED ORDER — ARTIFICIAL TEARS OP OINT
TOPICAL_OINTMENT | OPHTHALMIC | Status: DC | PRN
  Administered 2013-12-29: 1 via OPHTHALMIC

## 2013-12-29 MED ORDER — 0.9 % SODIUM CHLORIDE (POUR BTL) OPTIME
TOPICAL | Status: DC | PRN
  Administered 2013-12-29: 1000 mL

## 2013-12-29 MED ORDER — PROVISC 10 MG/ML IO SOLN
INTRAOCULAR | Status: DC | PRN
  Administered 2013-12-29: 0.85 mL via INTRAOCULAR

## 2013-12-29 MED ORDER — PHENYLEPHRINE HCL 2.5 % OP SOLN
1.0000 [drp] | OPHTHALMIC | Status: AC | PRN
  Administered 2013-12-29 (×3): 1 [drp] via OPHTHALMIC

## 2013-12-29 MED ORDER — SODIUM CHLORIDE 0.9 % IV SOLN
INTRAVENOUS | Status: DC
  Administered 2013-12-29: 10:00:00 via INTRAVENOUS

## 2013-12-29 MED ORDER — LACTATED RINGERS IV SOLN: INTRAVENOUS | Status: DC

## 2013-12-29 MED ORDER — CEFAZOLIN SUBCONJUNCTIVAL INJECTION 100 MG/0.5 ML
INJECTION | SUBCONJUNCTIVAL | Status: DC | PRN
  Administered 2013-12-29: 100 mg via SUBCONJUNCTIVAL

## 2013-12-29 MED ORDER — SODIUM CHLORIDE 0.9 % IV SOLN
INTRAVENOUS | Status: DC | PRN
  Administered 2013-12-29 (×2): via INTRAVENOUS

## 2013-12-29 MED ORDER — NA CHONDROIT SULF-NA HYALURON 40-30 MG/ML IO SOLN
INTRAOCULAR | Status: DC | PRN
  Administered 2013-12-29: 0.5 mL via INTRAOCULAR

## 2013-12-29 MED ORDER — EPINEPHRINE HCL 1 MG/ML IJ SOLN
INTRAMUSCULAR | Status: DC | PRN
  Administered 2013-12-29: 12:00:00

## 2013-12-29 MED ORDER — PROPOFOL 10 MG/ML IV BOLUS
INTRAVENOUS | Status: DC | PRN
  Administered 2013-12-29: 200 mg via INTRAVENOUS
  Administered 2013-12-29: 80 mg via INTRAVENOUS

## 2013-12-29 MED ORDER — HYPROMELLOSE (GONIOSCOPIC) 2.5 % OP SOLN
OPHTHALMIC | Status: DC | PRN
  Administered 2013-12-29: 2 [drp] via OPHTHALMIC

## 2013-12-29 MED ORDER — GATIFLOXACIN 0.5 % OP SOLN
1.0000 [drp] | OPHTHALMIC | Status: AC | PRN
Start: 2013-12-29 — End: 2013-12-29
  Administered 2013-12-29 (×3): 1 [drp] via OPHTHALMIC

## 2013-12-29 MED ORDER — HYDROMORPHONE HCL PF 1 MG/ML IJ SOLN: 0.2500 mg | INTRAMUSCULAR | Status: DC | PRN

## 2013-12-29 MED ORDER — PHENYLEPHRINE HCL 2.5 % OP SOLN
OPHTHALMIC | Status: AC
  Administered 2013-12-29: 1 [drp] via OPHTHALMIC
  Filled 2013-12-29: qty 15

## 2013-12-29 MED ORDER — PROMETHAZINE HCL 25 MG/ML IJ SOLN: 6.2500 mg | INTRAMUSCULAR | Status: DC | PRN

## 2013-12-29 MED ORDER — FENTANYL CITRATE 0.05 MG/ML IJ SOLN
INTRAMUSCULAR | Status: DC | PRN
  Administered 2013-12-29: 100 ug via INTRAVENOUS
  Administered 2013-12-29: 50 ug via INTRAVENOUS

## 2013-12-29 MED ORDER — DEXAMETHASONE SODIUM PHOSPHATE 10 MG/ML IJ SOLN
INTRAMUSCULAR | Status: DC | PRN
  Administered 2013-12-29: 10 mg

## 2013-12-29 SURGICAL SUPPLY — 57 items
APL SRG 3 HI ABS STRL LF PLS (MISCELLANEOUS) ×1
APPLICATOR COTTON TIP 6IN STRL (MISCELLANEOUS) ×3 IMPLANT
APPLICATOR DR MATTHEWS STRL (MISCELLANEOUS) ×3 IMPLANT
BLADE KERATOME 2.75 (BLADE) ×2 IMPLANT
BLADE KERATOME 2.75MM (BLADE) ×1
BLADE STAB KNIFE 15DEG (BLADE) IMPLANT
COVER MAYO STAND STRL (DRAPES) ×3 IMPLANT
DRAPE OPHTHALMIC 77X100 STRL (CUSTOM PROCEDURE TRAY) ×3 IMPLANT
DRAPE POUCH INSTRU U-SHP 10X18 (DRAPES) ×3 IMPLANT
DRSG TEGADERM 4X4.75 (GAUZE/BANDAGES/DRESSINGS) ×3 IMPLANT
FILTER BLUE MILLIPORE (MISCELLANEOUS) IMPLANT
GLOVE BIO SURGEON STRL SZ7.5 (GLOVE) ×2 IMPLANT
GLOVE SS BIOGEL STRL SZ 6.5 (GLOVE) ×1 IMPLANT
GLOVE SUPERSENSE BIOGEL SZ 6.5 (GLOVE) ×2
GLOVE SURG SS PI 7.0 STRL IVOR (GLOVE) ×2 IMPLANT
GOWN SRG XL XLNG 56XLVL 4 (GOWN DISPOSABLE) ×1 IMPLANT
GOWN STRL NON-REIN LRG LVL3 (GOWN DISPOSABLE) ×3 IMPLANT
GOWN STRL NON-REIN XL XLG LVL4 (GOWN DISPOSABLE) ×3
KIT BASIN OR (CUSTOM PROCEDURE TRAY) ×3 IMPLANT
KIT ROOM TURNOVER OR (KITS) ×3 IMPLANT
KNIFE GRIESHABER SHARP 2.5MM (MISCELLANEOUS) ×3 IMPLANT
LENS IOL ACRYSOF MP POST 21.5 (Intraocular Lens) ×2 IMPLANT
MASK EYE SHIELD (GAUZE/BANDAGES/DRESSINGS) ×2 IMPLANT
NDL 25GX 5/8IN NON SAFETY (NEEDLE) ×2 IMPLANT
NDL FILTER BLUNT 18X1 1/2 (NEEDLE) IMPLANT
NDL HYPO 30X.5 LL (NEEDLE) ×2 IMPLANT
NEEDLE 22X1 1/2 (OR ONLY) (NEEDLE) IMPLANT
NEEDLE 25GX 5/8IN NON SAFETY (NEEDLE) ×6 IMPLANT
NEEDLE FILTER BLUNT 18X 1/2SAF (NEEDLE)
NEEDLE FILTER BLUNT 18X1 1/2 (NEEDLE) IMPLANT
NEEDLE HYPO 30X.5 LL (NEEDLE) ×6 IMPLANT
NS IRRIG 1000ML POUR BTL (IV SOLUTION) ×3 IMPLANT
PACK CATARACT CUSTOM (CUSTOM PROCEDURE TRAY) ×3 IMPLANT
PAD ARMBOARD 7.5X6 YLW CONV (MISCELLANEOUS) ×3 IMPLANT
PAD EYE OVAL STERILE LF (GAUZE/BANDAGES/DRESSINGS) ×2 IMPLANT
PAK PIK CVS CATARACT (OPHTHALMIC) ×3 IMPLANT
PROBE ANTERIOR 20G W/INFUS NDL (MISCELLANEOUS) IMPLANT
SHUTTLE MONARCH TYPE A (NEEDLE) ×3 IMPLANT
SPEAR EYE SURG WECK-CEL (MISCELLANEOUS) IMPLANT
SUT ETHILON 10-0 CS-B-6CS-B-6 (SUTURE)
SUT ETHILON 5 0 P 3 18 (SUTURE)
SUT ETHILON 9 0 TG140 8 (SUTURE) IMPLANT
SUT NYLON ETHILON 5-0 P-3 1X18 (SUTURE) IMPLANT
SUT PLAIN 6 0 TG1408 (SUTURE) IMPLANT
SUT POLY NON ABSORB 10-0 8 STR (SUTURE) IMPLANT
SUT VICRYL 6 0 S 29 12 (SUTURE) IMPLANT
SUTURE EHLN 10-0 CS-B-6CS-B-6 (SUTURE) IMPLANT
SYR 20CC LL (SYRINGE) IMPLANT
SYR TB 1ML LUER SLIP (SYRINGE) IMPLANT
SYRINGE 10CC LL (SYRINGE) IMPLANT
TAPE SURG TRANSPORE 1 IN (GAUZE/BANDAGES/DRESSINGS) IMPLANT
TAPE SURGICAL TRANSPORE 1 IN (GAUZE/BANDAGES/DRESSINGS) ×2
TIP ABS 45DEG FLARED 0.9MM (TIP) ×3 IMPLANT
TOWEL OR 17X24 6PK STRL BLUE (TOWEL DISPOSABLE) ×6 IMPLANT
WATER STERILE IRR 1000ML POUR (IV SOLUTION) ×3 IMPLANT
WIPE INSTRUMENT ADHESIVE BACK (MISCELLANEOUS) ×3 IMPLANT
WIPE INSTRUMENT VISIWIPE 73X73 (MISCELLANEOUS) ×3 IMPLANT

## 2013-12-29 NOTE — Transfer of Care (Signed)
Immediate Anesthesia Transfer of Care Note  Patient: Veronica Martinez  Procedure(s) Performed: Procedure(s): CATARACT EXTRACTION PHACO AND INTRAOCULAR LENS PLACEMENT (IOC) (Left)  Patient Location: PACU  Anesthesia Type:General  Level of Consciousness: awake, alert  and oriented  Airway & Oxygen Therapy: Patient Spontanous Breathing and Patient connected to nasal cannula oxygen  Post-op Assessment: Report given to PACU RN, Post -op Vital signs reviewed and stable and Patient moving all extremities X 4  Post vital signs: Reviewed and stable  Complications: No apparent anesthesia complications

## 2013-12-29 NOTE — Anesthesia Postprocedure Evaluation (Signed)
  Anesthesia Post-op Note  Patient: Veronica Martinez  Procedure(s) Performed: Procedure(s): CATARACT EXTRACTION PHACO AND INTRAOCULAR LENS PLACEMENT (IOC) (Left)  Patient Location: PACU  Anesthesia Type:General  Level of Consciousness: awake  Airway and Oxygen Therapy: Patient Spontanous Breathing  Post-op Pain: mild  Post-op Assessment: Post-op Vital signs reviewed  Post-op Vital Signs: stable  Complications: No apparent anesthesia complications

## 2013-12-29 NOTE — Anesthesia Procedure Notes (Signed)
Procedure Name: Intubation Date/Time: 12/29/2013 12:12 PM Performed by: Veronica Martinez Pre-anesthesia Checklist: Patient identified, Timeout performed, Emergency Drugs available, Suction available and Patient being monitored Patient Re-evaluated:Patient Re-evaluated prior to inductionOxygen Delivery Method: Circle system utilized Preoxygenation: Pre-oxygenation with 100% oxygen Intubation Type: IV induction Ventilation: Mask ventilation without difficulty Laryngoscope Size: Mac and 3 Grade View: Grade II Tube type: Oral Tube size: 7.5 mm Number of attempts: 1 Placement Confirmation: positive ETCO2,  ETT inserted through vocal cords under direct vision and breath sounds checked- equal and bilateral Secured at: 21 cm Tube secured with: Tape Dental Injury: Teeth and Oropharynx as per pre-operative assessment

## 2013-12-29 NOTE — H&P (Signed)
Patient Record  Veronica Martinez, Veronica A.  Patient Number: [id number]  Date of Birth: [birth year]  Age: 58 years old Gender: Female  Date of Evaluation: December 27, 2013  Chief Complaint: 58 year old female patient for 2 wk Cataract OD follow up.  History of Present Illness: 58 yo female for follow up cataract surgery od. Surgery done 09/22/13 by Dr. Anderson Malta. On pred forte qid. No pain or discomfort. No pus or mucus. Presents for post op evaluation.  Past History:  Allergies: Ultram (tramadol) (swelling), Active  Medications:  Ocular Medications: Ofloxacin 1gtt OD QID  Prednisolone 1gtt OD QID  Other Medications: Vitamin D3 (cholecalciferol (vitamin d3)) by Ara Kussmaul, capsule 2,000 unit, Protonix (pantoprazole) by Ara Kussmaul, granules DR for susp in packet 40 mg, multivitamin (multivitamin) by Ara Kussmaul, capsule, Fish Oil (omega-3 fatty acids-fish oil) by Ara Kussmaul, capsule,delayed release(DR/EC) 360-1,200 mg, Co Q-10 (coenzyme q10) by Ara Kussmaul, capsule 30 mg, Pred Forte (prednisolone acetate) by Ara Kussmaul, drops,suspension 1% Apply 1 drop in right eye four times a day, 10 ml, Start Date:10/04/2013, Ocuflox (ofloxacin) by Ara Kussmaul, drops 0.3% 1 drop in right eye four times a day, 5 ml, Start Date:10/04/2013, omega 3-dha-epa-fish oil (omega 3-dha-epa-fish oil) by Ara Kussmaul, capsule 1,200 (144-216) mg 1 capsule by mouth three times a day as directed capsule, Calcium Plus (calcium carbonate-vit d3-min) by Ara Kussmaul, tablet 600 mg calcium- 400 unit 2 tablet by mouth once a day as directed tablet, diclofenac sodium (diclofenac sodium) by Ara Kussmaul, tablet,delayed release (DR/EC) 75 mg 1 tablet by mouth twice a day as directed tablet, cholecalciferol (vitamin D3) (cholecalciferol (vitamin d3)) by Ara Kussmaul, capsule 2,000 unit 2 capsule by mouth as directed capsule, albuterol sulfate (albuterol sulfate) by Ara Kussmaul, HFA  aerosol inhaler 90 mcg/actuation 2 puff as directed puff, Synthroid (levothyroxine) by Ara Kussmaul, tablet 112 mcg 1 tablet by mouth once a day as directed tablet, pantoprazole (pantoprazole) by Ara Kussmaul, tablet,delayed release (DR/EC) 20 mg 1 tablet by mouth once a day as directed tablet, gabapentin (gabapentin) by Ara Kussmaul, tablet 600 mg 1 tablet by mouth three times a day as directed tablet, Flovent Diskus (fluticasone) by Ara Kussmaul, blister with device 50 mcg/actuation 2 puff as directed, Crestor (rosuvastatin) by Ara Kussmaul, tablet 10 mg 1 tablet by mouth once a day tablet, amitriptyline (amitriptyline) by Ara Kussmaul, tablet 25 mg 1 tablet by mouth at bedtime as directed tablet, benzonatate (benzonatate) by Ara Kussmaul, capsule 200 mg 1 capsule by mouth three times a day as directed capsule  Birth History: none  Past Ocular History:  Cataracts  Past Medical History:  Hip arthropathy  Shoulder impingement  Carpel tunnel  Past Surgical History:  Bilateral hip replacement  Impingment shoulder surgery  Carpel tunnel injections  Family History: no amblyopia (grandfather), + blindness, + cataracts (grandfather), no crossed eyes, no diabetic retinopathy, no glaucoma, no macular degeneration, no retinal detachment, no cancer, no diabetes, no heart disease, + high blood pressure (mother), + stroke (grandmother)  Social History:  Smoking Status: never smoker  Alcohol: none  Driving status: driving  Review of Systems:  All other systems are negative.  Examination:  Visual Acuity:  Distance VA Temple: OD: 20/30-1 OS: 20/50-1  IOP: OD: 26 OS: 14 @ 10:05AM (Goldmann applanation)  Manifest Refraction:  Sphere Cyl Axis VA Add VA Prism Base  R: +0.50 -1.25 180 20/30 +2.75  L: +0.50 -0.50 90 20/50 +2.75  comments: 10/19/13  Confrontation visual field:  OU: Normal  Motility:  OU: Normal  Pupils:  OU: Shape, size, direct and consensual reaction normal   Adnexa:  Preauricular LN, lacrimal drainage, lacrimal glands, orbit normal  Eyelids:  Eyelids: normal  Conjunctiva:  OD: modest sub-conj heme, injected  OU: bulbar, palpebral normal  Cornea:  OD: spk  OU: epithelium, stroma, endothelium, tear film normal  Anterior Chamber:  OD: +1 cell and flare  OS: depth normal, no cell, no flare  Iris:  OU: normal  Lens:  OD: PC IOL in good position; 1+ posterior capsular opacity, 1+ posterior capsular wrinkling  OS: 1+ nuclear sclerosis, 3+ cortical cataract  Vitreous:  OU: normal  Optic Disc:  OD: cupping: 0.6 x 0.55  OS: cupping: 0.6 x 0.5  OU: size, C/D ratio, appearance, NFL normal  Macula:  OU: normal  Vessels:  OU: normal  Periphery:  OU: normal  Orientation to person, place and time: Normal  Mood and affect: Normal  Impression:  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 I concur with her having a visually significant Cataract OS.  Will plan surgery for 12/29/2013  Medications:  zymaxid qid until gone  Continue pred forte qid  Patient Instructions: Call immediately if experience decreased vision, halos, red eye, or pain.  Decrease pink top to 2 x day.  Use blue top 2 x day for pressure.  Return to clinic: Jnauary 8th 4:15 PM  (electronically signed)  Lavella Hammock L. Anderson Malta, MD

## 2013-12-29 NOTE — Discharge Instructions (Signed)
Veronica Martinez      12/29/2013  Post-operative instructions for Veronica Heady L. Anderson Malta, MD  Caring for your eye:  Do not rub your eye and wash your hands before touching the eye area. This is important to avoid injury and infection.  You may use sterile gauze pads and sterile eye wash to cleanse the lid margins of mucous accumulation.  DO NOT REUSE GAUZE after wiping the eye. Use a new clean one if needed.  Be certain not to touch the top of the medication bottle to the eyelids to avoid contaminating your medicine bottle and causing infection.  After eye surgery the surface of the eye and eyelids may be puffy. You may note a red blotch(s) on the surface of the eye or a bruise on your eyelid. These are usually related to injections or instruments used in surgery and are not cause for alarm. You may also notice blood tinged tears on your eye pad, this is common and not cause for alarm. These findings will subside over the coming week or two.  Activity:  No jarring activities. Walking with assistance early on as needed is advised. Avoid straining and let me know if you have significant constipation. Do not bend over at the waist with the head below your waist to minimize risk of bleeding inside the eye.  Avoid getting water from washing your hair or showering  in your eye. Patch the eye if necessary during bathing to avoid contamination.    You may: watch television, work on you computer, read books, eat out, ride in a car.  Sleeping Position:  You may sleep in your customary manner.       Wear your eye shield for naps and sleeping at night for the first two weeks.   Eye Medication:  Zymaxid 1 gtt in operative eye four times daily.                               Prednisolobne Acetate 1% 1 gtt in operative  Eye four times daily  Wait a few minutes between your eye drops when placing them.   Resume your customary medications on your normal schedule.   [name] MD    After office  hours I can be reached by calling:   [phone]

## 2013-12-29 NOTE — Anesthesia Preprocedure Evaluation (Addendum)
Anesthesia Evaluation  Patient identified by MRN, date of birth, ID band Patient awake    History of Anesthesia Complications Negative for: history of anesthetic complications  Airway Mallampati: I  Neck ROM: Full    Dental  (+) Teeth Intact and Dental Advidsory Given   Pulmonary neg pulmonary ROS,  breath sounds clear to auscultation        Cardiovascular hypertension, Rhythm:Regular Rate:Normal     Neuro/Psych  Headaches,  Neuromuscular disease    GI/Hepatic GERD-  ,  Endo/Other  Hypothyroidism Morbid obesity  Renal/GU      Musculoskeletal   Abdominal (+) + obese,   Peds  Hematology   Anesthesia Other Findings   Reproductive/Obstetrics                          Anesthesia Physical Anesthesia Plan  ASA: III  Anesthesia Plan: General   Post-op Pain Management:    Induction: Intravenous  Airway Management Planned: Oral ETT  Additional Equipment:   Intra-op Plan:   Post-operative Plan: Extubation in OR  Informed Consent: I have reviewed the patients History and Physical, chart, labs and discussed the procedure including the risks, benefits and alternatives for the proposed anesthesia with the patient or authorized representative who has indicated his/her understanding and acceptance.   Dental advisory given and Dental Advisory Given  Plan Discussed with: CRNA, Surgeon and Anesthesiologist  Anesthesia Plan Comments: (Patient desires Gen anesth)      Anesthesia Quick Evaluation

## 2013-12-29 NOTE — Op Note (Signed)
JANIAH DEVINNEY 12/29/2013 Cataract: Combined, Nuclear  Procedure: Phacoemulsification, Posterior Chamber Intra-ocular Lens Operative Eye:  left eye  Surgeon: [name] Estimated Blood Loss: minimal Specimens for Pathology:  None Complications: none  The patient was prepared and draped in the usual manner for ocular surgery on the left eye. A Cook lid speculum was placed. A peripheral clear corneal incision was made at the surgical limbus centered at the 11:00 meridian. A separate clear corneal stab incision was made with a 15 degree blade at the 2:00 meridian to permit bi-manual technique. Viscoat and  Provisc as an underlying layer next to the capsule was instilled into the anterior chamber through that incision.  A keratome was used to create a self sealing incision entering the anterior chamber at the 11:00 meridian. A capsulorhexis was performed using a bent 25g needle. The lens was hydrodissected and the nucleus was hydrodilineated using a Nichammin cannula. The Chang chopper was inserted and used to rotate the lens to insure adequate lens mobility. The phacoemulsification handpiece was inserted and a combined phaco-chop technique was employed, fracturing the lens into separate sections with subsequent removal with the phaco handpiece.   The I/A cannula was used to remove remaining lens cortex. Provisc was instilled and used to deepen the anterior chamber and posterior capsule bag. The Monarch injector was used to place a folded Acrysof [id number] PC IOL, + 21.50  diopters, into the capsule bag. A Sinskey lens hook was used to dial in the trailing haptic.  The I/A cannula was used to remove the viscoelastic from the anterior chamber. BSS was used to bring IOP to the desired range and the wound was checked to insure it was watertight. Subconjunctival injections of Ancef 100/0.59ml and Dexamethasone 0.5 ml of a 10mg /35ml solution were placed without complication. The lid speculum and drapes were  removed and the patient's eye was patched with Polymixin/Bacitracin ophthalmic ointment. An eye shield was placed and the patient was transferred alert and conversant from the operating room to the post-operative recovery area.   [name], MD

## 2013-12-29 NOTE — Interval H&P Note (Signed)
History and Physical Interval Note:  12/29/2013 12:02 PM BP 161/77  Pulse 96  Temp(Src) 97.7 F (36.5 C) (Oral)  Resp 18  SpO2 97%   Veronica Martinez  has presented today for surgery, with the diagnosis of Combined Cataract Left Eye  The various methods of treatment have been discussed with the patient and family. After consideration of risks, benefits and other options for treatment, the patient has consented to  Procedure(s): CATARACT EXTRACTION PHACO AND INTRAOCULAR LENS PLACEMENT ([place]) (Left) as a surgical intervention .  The patient's history has been reviewed, patient examined, no change in status, stable for surgery.  I have reviewed the patient's chart and labs.  Questions were answered to the patient's satisfaction.    There are no contraindications for planned surgery.   Toniann Dickerson, Lavella Hammock

## 2014-01-03 ENCOUNTER — Encounter (HOSPITAL_COMMUNITY): Payer: Self-pay | Admitting: Ophthalmology

## 2014-01-04 ENCOUNTER — Encounter: Payer: Self-pay | Admitting: Emergency Medicine

## 2014-01-04 ENCOUNTER — Ambulatory Visit (INDEPENDENT_AMBULATORY_CARE_PROVIDER_SITE_OTHER): Payer: Medicare Other | Admitting: Emergency Medicine

## 2014-01-04 VITALS — BP 154/86 | HR 80 | Temp 97.9°F | Resp 18 | Ht 64.0 in | Wt 261.2 lb

## 2014-01-04 DIAGNOSIS — R739 Hyperglycemia, unspecified: Secondary | ICD-10-CM

## 2014-01-04 DIAGNOSIS — Z79899 Other long term (current) drug therapy: Secondary | ICD-10-CM

## 2014-01-04 DIAGNOSIS — R945 Abnormal results of liver function studies: Secondary | ICD-10-CM

## 2014-01-04 DIAGNOSIS — R7989 Other specified abnormal findings of blood chemistry: Secondary | ICD-10-CM

## 2014-01-04 DIAGNOSIS — R7309 Other abnormal glucose: Secondary | ICD-10-CM

## 2014-01-04 DIAGNOSIS — E7800 Pure hypercholesterolemia, unspecified: Secondary | ICD-10-CM

## 2014-01-04 LAB — COMPLETE METABOLIC PANEL WITH GFR
ALT: 20 U/L (ref 0–35)
AST: 18 U/L (ref 0–37)
Albumin: 4.3 g/dL (ref 3.5–5.2)
Alkaline Phosphatase: 88 U/L (ref 39–117)
BUN: 9 mg/dL (ref 6–23)
CO2: 28 mEq/L (ref 19–32)
Calcium: 9.5 mg/dL (ref 8.4–10.5)
Chloride: 104 mEq/L (ref 96–112)
Creat: 0.74 mg/dL (ref 0.50–1.10)
GFR, Est African American: >89 mL/min
GFR, Est Non African American: 89 mL/min
Glucose, Bld: 94 mg/dL (ref 70–99)
Potassium: 4.4 mEq/L (ref 3.5–5.3)
Sodium: 142 mEq/L (ref 135–145)
Total Bilirubin: 0.5 mg/dL (ref 0.3–1.2)
Total Protein: 7.2 g/dL (ref 6.0–8.3)

## 2014-01-04 LAB — LIPID PANEL
Cholesterol: 147 mg/dL (ref 0–200)
HDL: 78 mg/dL (ref >39)
LDL Cholesterol: 57 mg/dL (ref 0–99)
Total CHOL/HDL Ratio: 1.9 Ratio
Triglycerides: 59 mg/dL (ref <150)
VLDL: 12 mg/dL (ref 0–40)

## 2014-01-04 LAB — GLUCOSE, POCT (MANUAL RESULT ENTRY): POC Glucose: 114 mg/dl — AB (ref 70–99)

## 2014-01-04 LAB — POCT GLYCOSYLATED HEMOGLOBIN (HGB A1C): Hemoglobin A1C: 5.7

## 2014-01-04 NOTE — Progress Notes (Addendum)
Subjective:    Patient ID: Veronica Martinez, female    DOB: 12-Mar-1956, 58 y.o.   MRN: [id number]  HPI This chart was scribed for Remo Lipps Faithe Ariola-MD, by Lovena Le Day, Scribe. This patient was seen in room 21 and the patient's care was started at 10:41 AM.  HPI Comments: Veronica Martinez is a 58 y.o. female who presents to the [facility] for checkup of her hyperlipidemia. She also had elevated blood sugars from her blood work 11 days ago. She reports that she had surgery for cataract extraction of her left eye 6 days ago. She reports that she is doing well after this surgery and is with some injection of her left eye. Dr. Anderson Malta on [address] performed her eye surgery.  Patient Active Problem List   Diagnosis Date Noted  . 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 Right hip pain 08/09/2013  . Trigger finger, acquired 04/22/2013  . Carpal tunnel syndrome of right wrist 04/22/2013  . Pain of right heel 12/08/2012  . Myalgia 11/10/2012  . OBESITY, UNSPECIFIED 02/20/2011  . BACK PAIN, LUMBAR 07/05/2010  . ABNORMALITY OF GAIT 05/02/2010  . MERALGIA PARESTHETICA 03/02/2010  . HIP PAIN, BILATERAL 03/02/2010   Past Surgical History  Procedure Laterality Date  . Shoulder surgery    . Appendectomy    . Joint replacement    . Abdominal hysterectomy    . Hip arthroplasty Bilateral   . Other surgical history Right     carpal tunel injection  . Cataract extraction w/phaco Right 09/22/2013    Procedure: CATARACT EXTRACTION PHACO AND INTRAOCULAR LENS PLACEMENT ([facility]);  Surgeon: [name], MD;  Location: [facility];  Service: Ophthalmology;  Laterality: Right;  . Eye surgery    . Tonsillectomy    . Colonoscopy    . Cataract extraction w/phaco Left 12/29/2013    Procedure: CATARACT EXTRACTION PHACO AND INTRAOCULAR LENS PLACEMENT ([facility]);  Surgeon: [name], MD;  Location: [facility];  Service: Ophthalmology;  Laterality: Left;   No family history on file.  History   Social  History  . Marital Status: Married    Spouse Name: N/A    Number of Children: N/A  . Years of Education: N/A   Occupational History  . Not on file.   Social History Main Topics  . Smoking status: Never Smoker   . Smokeless tobacco: Never Used  . Alcohol Use: No  . Drug Use: No  . Sexual Activity: Yes    Partners: Male   Other Topics Concern  . Not on file   Social History Narrative   Married. Education: [employer].    Allergies  Allergen Reactions  . Meloxicam Other (See Comments)    Reaction unknown  . Pravastatin Sodium Other (See Comments)    Reaction unknown  . Simvastatin Other (See Comments)    Reaction unknown  . Tramadol Hcl Other (See Comments)    Reaction unknown   Results for orders placed during the hospital encounter of 76/28/31  BASIC METABOLIC PANEL      Result Value Range   Sodium 142  137 - 147 mEq/L   Potassium 4.5  3.7 - 5.3 mEq/L   Chloride 101  96 - 112 mEq/L   CO2 31  19 - 32 mEq/L   Glucose, Bld 141 (*) 70 - 99 mg/dL   BUN 11  6 - 23 mg/dL   Creatinine, Ser 0.79  0.50 - 1.10 mg/dL   Calcium 9.8  8.4 - 10.5 mg/dL   GFR calc non Af Amer 89 (*) >90 mL/min   GFR calc Af Amer >90  >90 mL/min  CBC      Result Value Range   WBC 4.7  4.0 - 10.5 K/uL   RBC 4.26  3.87 - 5.11 MIL/uL   Hemoglobin 13.1  12.0 - 15.0 g/dL   HCT 40.0  36.0 - 46.0 %   MCV 93.9  78.0 - 100.0 fL   MCH 30.8  26.0 - 34.0 pg   MCHC 32.8  30.0 - 36.0 g/dL   RDW 13.8  11.5 - 15.5 %   Platelets 192  150 - 400 K/uL   Review of Systems  Constitutional: Negative for fever and chills.  Eyes: Positive for redness (left eye redness).  Respiratory: Negative for cough and shortness of breath.   Cardiovascular: Negative for chest pain.  Musculoskeletal: Negative for back pain.  Skin: Negative for color change.       Objective:   Physical Exam Nursing note and vitals reviewed. Constitutional: Patient is oriented to person, place, and time. Patient appears well-developed  and well-nourished. No distress.  HENT: There is significant redness around the left. There is a new incision placed the  Head: Normocephalic and atraumatic.  Neck: Neck supple. No tracheal deviation present.  Cardiovascular: Normal rate, regular rhythm and normal heart sounds.   No murmur heard. Pulmonary/Chest: Effort normal and breath sounds normal. No respiratory distress. Patient has no wheezes. Patient has no rales.  Musculoskeletal: Normal range of motion.  Neurological: Patient is alert and oriented to person, place, and time.  Skin: Skin is warm and dry.  Psychiatric: Patient has a normal mood and affect. Patient's behavior is normal.   Triage Vitals: BP 154/86  Pulse 80  Temp(Src) 97.9 F (36.6 C) (Oral)  Resp 18  Ht 5\' 4"  (1.626 m)  Wt 261 lb 3.2 oz (118.48 kg)  BMI 44.81 kg/m2  SpO2 95%  DIAGNOSTIC STUDIES: Oxygen Saturation is 95% on room air, adequate by my interpretation.   Results for orders placed in visit on 01/04/14  GLUCOSE, POCT (MANUAL RESULT ENTRY)      Result Value Range   POC Glucose 114 (*) 70 - 99 mg/dl  POCT GLYCOSYLATED HEMOGLOBIN (HGB A1C)      Result Value Range   Hemoglobin A1C 5.7     COORDINATION OF CARE: At 1039 AM Discussed treatment plan with patient which includes blood work. Patient agrees.      Assessment & Plan:  Patient doing well. She had her cataract removed a week ago and is due to see the ophthalmologist this afternoon due to some visual blurring. Her hemoglobin A1c was 5.7 she will work on diet exercise and weight loss for the I personally performed the services described in this documentation, which was scribed in my presence. The recorded information has been reviewed and is accurate.

## 2014-02-21 ENCOUNTER — Other Ambulatory Visit: Payer: Self-pay | Admitting: Physician Assistant

## 2014-04-11 ENCOUNTER — Other Ambulatory Visit: Payer: Self-pay | Admitting: Sports Medicine

## 2014-04-18 ENCOUNTER — Other Ambulatory Visit: Payer: Self-pay | Admitting: Sports Medicine

## 2014-04-25 ENCOUNTER — Other Ambulatory Visit: Payer: Self-pay | Admitting: Sports Medicine

## 2014-05-25 ENCOUNTER — Ambulatory Visit: Payer: Medicare Other | Admitting: Sports Medicine

## 2014-07-01 ENCOUNTER — Ambulatory Visit (INDEPENDENT_AMBULATORY_CARE_PROVIDER_SITE_OTHER): Payer: Medicare Other | Admitting: Emergency Medicine

## 2014-07-01 ENCOUNTER — Telehealth: Payer: Self-pay

## 2014-07-01 VITALS — BP 132/84 | HR 78 | Temp 98.5°F | Resp 16 | Ht 64.0 in | Wt 254.5 lb

## 2014-07-01 DIAGNOSIS — R110 Nausea: Secondary | ICD-10-CM

## 2014-07-01 DIAGNOSIS — R1031 Right lower quadrant pain: Secondary | ICD-10-CM

## 2014-07-01 DIAGNOSIS — G8929 Other chronic pain: Secondary | ICD-10-CM

## 2014-07-01 DIAGNOSIS — R42 Dizziness and giddiness: Secondary | ICD-10-CM

## 2014-07-01 DIAGNOSIS — R51 Headache: Secondary | ICD-10-CM

## 2014-07-01 LAB — CBC WITH DIFFERENTIAL/PLATELET
Basophils Absolute: 0 10*3/uL (ref 0.0–0.1)
Basophils Relative: 0 % (ref 0–1)
Eosinophils Absolute: 0.1 10*3/uL (ref 0.0–0.7)
Eosinophils Relative: 1 % (ref 0–5)
HCT: 40.9 % (ref 36.0–46.0)
Hemoglobin: 14.3 g/dL (ref 12.0–15.0)
Lymphocytes Relative: 48 % — ABNORMAL HIGH (ref 12–46)
Lymphs Abs: 3 10*3/uL (ref 0.7–4.0)
MCH: 31.3 pg (ref 26.0–34.0)
MCHC: 35 g/dL (ref 30.0–36.0)
MCV: 89.5 fL (ref 78.0–100.0)
Monocytes Absolute: 0.7 10*3/uL (ref 0.1–1.0)
Monocytes Relative: 11 % (ref 3–12)
Neutro Abs: 2.5 10*3/uL (ref 1.7–7.7)
Neutrophils Relative %: 40 % — ABNORMAL LOW (ref 43–77)
Platelets: 214 10*3/uL (ref 150–400)
RBC: 4.57 MIL/uL (ref 3.87–5.11)
RDW: 14.9 % (ref 11.5–15.5)
WBC: 6.3 10*3/uL (ref 4.0–10.5)

## 2014-07-01 LAB — GLUCOSE, POCT (MANUAL RESULT ENTRY): POC Glucose: 97 mg/dl (ref 70–99)

## 2014-07-01 LAB — COMPLETE METABOLIC PANEL WITH GFR
ALT: 30 U/L (ref 0–35)
AST: 25 U/L (ref 0–37)
Albumin: 4.7 g/dL (ref 3.5–5.2)
Alkaline Phosphatase: 86 U/L (ref 39–117)
BUN: 9 mg/dL (ref 6–23)
CO2: 25 mEq/L (ref 19–32)
Calcium: 9.8 mg/dL (ref 8.4–10.5)
Chloride: 103 mEq/L (ref 96–112)
Creat: 0.74 mg/dL (ref 0.50–1.10)
GFR, Est African American: >89 mL/min
GFR, Est Non African American: 88 mL/min
Glucose, Bld: 97 mg/dL (ref 70–99)
Potassium: 4.2 mEq/L (ref 3.5–5.3)
Sodium: 140 mEq/L (ref 135–145)
Total Bilirubin: 0.4 mg/dL (ref 0.2–1.2)
Total Protein: 7.6 g/dL (ref 6.0–8.3)

## 2014-07-01 LAB — POCT URINALYSIS DIPSTICK
Bilirubin, UA: NEGATIVE
Blood, UA: NEGATIVE
Glucose, UA: NEGATIVE
Ketones, UA: NEGATIVE
Leukocytes, UA: NEGATIVE
Nitrite, UA: NEGATIVE
Protein, UA: NEGATIVE
Spec Grav, UA: <=1.005
Urobilinogen, UA: 0.2
pH, UA: 6.5

## 2014-07-01 LAB — POCT UA - MICROSCOPIC ONLY
Bacteria, U Microscopic: NEGATIVE
Casts, Ur, LPF, POC: NEGATIVE
Crystals, Ur, HPF, POC: NEGATIVE
Mucus, UA: NEGATIVE
RBC, urine, microscopic: NEGATIVE
WBC, Ur, HPF, POC: NEGATIVE
Yeast, UA: NEGATIVE

## 2014-07-01 MED ORDER — ONDANSETRON 8 MG PO TBDP: 8.0000 mg | ORAL_TABLET | Freq: Three times a day (TID) | ORAL | Status: DC | PRN

## 2014-07-01 MED ORDER — ONDANSETRON HCL 4 MG PO TABS: 4.0000 mg | ORAL_TABLET | Freq: Three times a day (TID) | ORAL | Status: DC | PRN

## 2014-07-01 NOTE — Addendum Note (Signed)
Addended by: Arlyss Queen A on: 07/01/2014 07:14 PM   Modules accepted: Orders

## 2014-07-01 NOTE — Telephone Encounter (Signed)
Patient came to pick up zofran at [facility] [address] today after seeing Dr. Everlene Farrier she says the medication costs $120. Can he call something else in for her or what do you advise? Patient hopes to have this addressed tonight I told her we will try and I will let clinical TL know. She feels very sick still.   Best: [phone]

## 2014-07-01 NOTE — Telephone Encounter (Signed)
patient given Zofran 4 mg regular tablets and patient called and she can afford this

## 2014-07-01 NOTE — Progress Notes (Addendum)
Subjective:    Patient ID: Veronica Martinez, female    DOB: 04-07-1956, 58 y.o.   MRN: [id number] This chart was scribed for Arlyss Queen, MD by Vernell Barrier, Medical Scribe. The patient was seen in room 5. This patient's care was started at 4:55 PM.  Dizziness Associated symptoms include abdominal pain, headaches, nausea and a rash. Pertinent negatives include no chest pain, coughing, fever or vomiting.  Rash Pertinent negatives include no cough, fever or vomiting.  Headache  Associated symptoms include abdominal pain, dizziness and nausea. Pertinent negatives include no coughing, fever or vomiting.   HPI Comments: Veronica Martinez is a 58 y.o. female who presents to the [facility] complaining of dizziness, nausea, and HA; onset 2 weeks. Also reports 2 weeks of an itchy ash at the back of the neck. No pain associated with rash. intermittent milld RLQ  abdominal pain noted as well. Describes pain as when she used to have cramps.Taking Mucinex for pain and pressure which usually relieves HA sxs but states this time it did not work. Believes some sxs may be allergy related. No sick contacts. No recently travel. Up to date with pap smear and colonoscopy. Denies fever, cough, vomiting, dysuria, or chest pain.  Review of Systems  Constitutional: Negative for fever.  Respiratory: Negative for cough.   Cardiovascular: Negative for chest pain.  Gastrointestinal: Positive for nausea and abdominal pain. Negative for vomiting.  Skin: Positive for rash.  Neurological: Positive for dizziness and headaches.   Objective:  Physical Exam CONSTITUTIONAL: Well developed/well nourished HEAD: Normocephalic/atraumatic EYES: EOMI/PERRL ENMT: Mucous membranes moist NECK: supple no meningeal signs; no cervical adenopathy SPINE:entire spine nontender CV: S1/S2 noted, no murmurs/rubs/gallops noted LUNGS: Lungs are clear to auscultation bilaterally, no apparent distress ABDOMEN: soft, no  rebound or guarding; Very mild tenderness deep RLQ close to pubic symphysis GU:no cva tenderness NEURO: Pt is awake/alert, moves all extremitiesx4 EXTREMITIES: pulses normal, full ROM SKIN: warm, color normal; Fine maculopapular rash to back of neck PSYCH: no abnormalities of mood noted  Results for orders placed in visit on 07/01/14  GLUCOSE, POCT (MANUAL RESULT ENTRY)      Result Value Ref Range   POC Glucose 97  70 - 99 mg/dl  POCT URINALYSIS DIPSTICK      Result Value Ref Range   Color, UA yellow     Clarity, UA clear     Glucose, UA neg     Bilirubin, UA neg     Ketones, UA neg     Spec Grav, UA <=1.005     Blood, UA neg     pH, UA 6.5     Protein, UA neg     Urobilinogen, UA 0.2     Nitrite, UA neg     Leukocytes, UA Negative    POCT UA - MICROSCOPIC ONLY      Result Value Ref Range   WBC, Ur, HPF, POC neg     RBC, urine, microscopic neg     Bacteria, U Microscopic neg     Mucus, UA neg     Epithelial cells, urine per micros 0-2     Crystals, Ur, HPF, POC neg     Casts, Ur, LPF, POC neg     Yeast, UA neg     Results for orders placed in visit on 07/01/14  CBC WITH DIFFERENTIAL      Result Value Ref Range   WBC 6.3  4.0 - 10.5 K/uL  RBC 4.57  3.87 - 5.11 MIL/uL   Hemoglobin 14.3  12.0 - 15.0 g/dL   HCT 40.9  36.0 - 46.0 %   MCV 89.5  78.0 - 100.0 fL   MCH 31.3  26.0 - 34.0 pg   MCHC 35.0  30.0 - 36.0 g/dL   RDW 14.9  11.5 - 15.5 %   Platelets 214  150 - 400 K/uL   Neutrophils Relative % 40 (*) 43 - 77 %   Neutro Abs 2.5  1.7 - 7.7 K/uL   Lymphocytes Relative 48 (*) 12 - 46 %   Lymphs Abs 3.0  0.7 - 4.0 K/uL   Monocytes Relative 11  3 - 12 %   Monocytes Absolute 0.7  0.1 - 1.0 K/uL   Eosinophils Relative 1  0 - 5 %   Eosinophils Absolute 0.1  0.0 - 0.7 K/uL   Basophils Relative 0  0 - 1 %   Basophils Absolute 0.0  0.0 - 0.1 K/uL   Smear Review Criteria for review not met    GLUCOSE, POCT (MANUAL RESULT ENTRY)      Result Value Ref Range   POC Glucose  97  70 - 99 mg/dl  POCT URINALYSIS DIPSTICK      Result Value Ref Range   Color, UA yellow     Clarity, UA clear     Glucose, UA neg     Bilirubin, UA neg     Ketones, UA neg     Spec Grav, UA <=1.005     Blood, UA neg     pH, UA 6.5     Protein, UA neg     Urobilinogen, UA 0.2     Nitrite, UA neg     Leukocytes, UA Negative    POCT UA - MICROSCOPIC ONLY      Result Value Ref Range   WBC, Ur, HPF, POC neg     RBC, urine, microscopic neg     Bacteria, U Microscopic neg     Mucus, UA neg     Epithelial cells, urine per micros 0-2     Crystals, Ur, HPF, POC neg     Casts, Ur, LPF, POC neg     Yeast, UA neg      Assessment & Plan:  Blood tests are pending. CBC  and urine are good. Will treat with Zofran for nausea. She is up-to-date on her colonoscopy with Dr. Delma Officer. her last being in 2007. She may need a scan of her abdomen. We'll see what her labs show first  I personally performed the services described in this documentation, which was scribed in my presence. The recorded information has been reviewed and is accurate.

## 2014-07-19 ENCOUNTER — Telehealth: Payer: Self-pay

## 2014-07-19 NOTE — Telephone Encounter (Signed)
i dont see any decumented calls, spoke to pt, she understands.

## 2014-07-19 NOTE — Telephone Encounter (Signed)
Patient states someone called her. Does not know who called/what their call was about. Please return her call and advise. Thank you.

## 2014-08-18 ENCOUNTER — Other Ambulatory Visit: Payer: Self-pay | Admitting: Sports Medicine

## 2014-08-29 ENCOUNTER — Ambulatory Visit (INDEPENDENT_AMBULATORY_CARE_PROVIDER_SITE_OTHER): Payer: Medicare Other | Admitting: Family Medicine

## 2014-08-29 VITALS — BP 132/70 | HR 75 | Temp 97.7°F | Resp 18 | Ht 64.0 in | Wt 255.0 lb

## 2014-08-29 DIAGNOSIS — R059 Cough, unspecified: Secondary | ICD-10-CM

## 2014-08-29 DIAGNOSIS — R0982 Postnasal drip: Secondary | ICD-10-CM

## 2014-08-29 DIAGNOSIS — R05 Cough: Secondary | ICD-10-CM

## 2014-08-29 DIAGNOSIS — J329 Chronic sinusitis, unspecified: Secondary | ICD-10-CM

## 2014-08-29 MED ORDER — BENZONATATE 100 MG PO CAPS: 100.0000 mg | ORAL_CAPSULE | Freq: Three times a day (TID) | ORAL | Status: DC | PRN

## 2014-08-29 MED ORDER — PREDNISONE 20 MG PO TABS: 20.0000 mg | ORAL_TABLET | Freq: Every day | ORAL | Status: DC

## 2014-08-29 NOTE — Progress Notes (Signed)
Subjective: Pleasant lady in no major distress. For the last month she's been having a lot of trouble with postnasal drainage and cough. She does not smoke. She does have some fluticasone nose spray that she uses. The cough is pretty annoying. It bothers her a lot when she lays down. She is not aware of any major reflux problems at this time. She does have some pantoprazole, but does not take it on regular basis.  Objective: TMs normal. Throat does have some erythema right on the tonsillar pillars on both sides, presumably from the drainage. Neck supple without significant nodes. Chest clear. Heart regular without murmurs.  Assessment: Postnasal drainage Cough  Plan: Prednisone taper Allegra Continue fluticasone spray. Instructed her in how to use it. Return if not improving  Take the pansoprasole regularly for several weeks

## 2014-08-29 NOTE — Patient Instructions (Signed)
Continue using the fluticasone nose spray 2 sprays each nostril daily  Take the prednisone 3 daily for 2 days, then 2 daily for 2 days, then one daily for 2 days. Take after breakfast.  Also begin Allegra 1 daily (fexofenadine)  Take your pantoprazole one daily for 3 or 4 weeks  Return if not improving

## 2014-09-14 ENCOUNTER — Other Ambulatory Visit: Payer: Self-pay | Admitting: *Deleted

## 2014-09-14 MED ORDER — GABAPENTIN 600 MG PO TABS: 600.0000 mg | ORAL_TABLET | Freq: Three times a day (TID) | ORAL | Status: DC

## 2014-10-18 ENCOUNTER — Other Ambulatory Visit: Payer: Self-pay | Admitting: Emergency Medicine

## 2014-12-09 ENCOUNTER — Other Ambulatory Visit: Payer: Self-pay | Admitting: Obstetrics and Gynecology

## 2014-12-09 DIAGNOSIS — Z1231 Encounter for screening mammogram for malignant neoplasm of breast: Secondary | ICD-10-CM

## 2014-12-26 ENCOUNTER — Other Ambulatory Visit: Payer: Self-pay | Admitting: Emergency Medicine

## 2014-12-30 ENCOUNTER — Encounter (INDEPENDENT_AMBULATORY_CARE_PROVIDER_SITE_OTHER): Payer: Self-pay

## 2014-12-30 ENCOUNTER — Ambulatory Visit
Admission: RE | Admit: 2014-12-30 | Discharge: 2014-12-30 | Disposition: A | Discharge status: Home / Self Care | Payer: Medicare Other | Source: Ambulatory Visit | Attending: Obstetrics and Gynecology | Admitting: Obstetrics and Gynecology

## 2014-12-30 DIAGNOSIS — Z1231 Encounter for screening mammogram for malignant neoplasm of breast: Secondary | ICD-10-CM

## 2015-01-02 ENCOUNTER — Other Ambulatory Visit: Payer: Self-pay | Admitting: Emergency Medicine

## 2015-01-02 ENCOUNTER — Other Ambulatory Visit: Payer: Self-pay | Admitting: Obstetrics and Gynecology

## 2015-01-02 DIAGNOSIS — R928 Other abnormal and inconclusive findings on diagnostic imaging of breast: Secondary | ICD-10-CM

## 2015-01-10 ENCOUNTER — Telehealth: Payer: Self-pay | Admitting: *Deleted

## 2015-01-10 ENCOUNTER — Encounter: Payer: Self-pay | Admitting: Emergency Medicine

## 2015-01-10 ENCOUNTER — Ambulatory Visit (INDEPENDENT_AMBULATORY_CARE_PROVIDER_SITE_OTHER): Payer: Medicare Other | Admitting: Emergency Medicine

## 2015-01-10 VITALS — BP 150/80 | HR 84 | Temp 97.9°F | Resp 20 | Ht 64.25 in | Wt 246.4 lb

## 2015-01-10 DIAGNOSIS — Z1211 Encounter for screening for malignant neoplasm of colon: Secondary | ICD-10-CM

## 2015-01-10 DIAGNOSIS — R1030 Lower abdominal pain, unspecified: Secondary | ICD-10-CM

## 2015-01-10 DIAGNOSIS — R928 Other abnormal and inconclusive findings on diagnostic imaging of breast: Secondary | ICD-10-CM

## 2015-01-10 DIAGNOSIS — IMO0002 Reserved for concepts with insufficient information to code with codable children: Secondary | ICD-10-CM

## 2015-01-10 DIAGNOSIS — E785 Hyperlipidemia, unspecified: Secondary | ICD-10-CM

## 2015-01-10 DIAGNOSIS — Z23 Encounter for immunization: Secondary | ICD-10-CM

## 2015-01-10 DIAGNOSIS — R921 Mammographic calcification found on diagnostic imaging of breast: Secondary | ICD-10-CM

## 2015-01-10 LAB — COMPREHENSIVE METABOLIC PANEL
ALT: 19 U/L (ref 0–35)
AST: 18 U/L (ref 0–37)
Albumin: 4.5 g/dL (ref 3.5–5.2)
Alkaline Phosphatase: 92 U/L (ref 39–117)
BUN: 10 mg/dL (ref 6–23)
CO2: 25 mEq/L (ref 19–32)
Calcium: 9.5 mg/dL (ref 8.4–10.5)
Chloride: 105 mEq/L (ref 96–112)
Creat: 0.78 mg/dL (ref 0.50–1.10)
Glucose, Bld: 104 mg/dL — ABNORMAL HIGH (ref 70–99)
Potassium: 4.4 mEq/L (ref 3.5–5.3)
Sodium: 141 mEq/L (ref 135–145)
Total Bilirubin: 0.4 mg/dL (ref 0.2–1.2)
Total Protein: 7.6 g/dL (ref 6.0–8.3)

## 2015-01-10 LAB — CBC
HCT: 43.1 % (ref 36.0–46.0)
Hemoglobin: 14.8 g/dL (ref 12.0–15.0)
MCH: 31.6 pg (ref 26.0–34.0)
MCHC: 34.3 g/dL (ref 30.0–36.0)
MCV: 92.1 fL (ref 78.0–100.0)
MPV: 10.3 fL (ref 8.6–12.4)
Platelets: 187 10*3/uL (ref 150–400)
RBC: 4.68 MIL/uL (ref 3.87–5.11)
RDW: 13.9 % (ref 11.5–15.5)
WBC: 4.8 10*3/uL (ref 4.0–10.5)

## 2015-01-10 LAB — POCT UA - MICROSCOPIC ONLY
Casts, Ur, LPF, POC: NEGATIVE
Crystals, Ur, HPF, POC: NEGATIVE
Mucus, UA: POSITIVE
Yeast, UA: NEGATIVE

## 2015-01-10 LAB — LIPID PANEL
Cholesterol: 168 mg/dL (ref 0–200)
HDL: 83 mg/dL (ref >39)
LDL Cholesterol: 72 mg/dL (ref 0–99)
Total CHOL/HDL Ratio: 2 Ratio
Triglycerides: 64 mg/dL (ref <150)
VLDL: 13 mg/dL (ref 0–40)

## 2015-01-10 LAB — POCT URINALYSIS DIPSTICK
Bilirubin, UA: NEGATIVE
Blood, UA: NEGATIVE
Glucose, UA: NEGATIVE
Ketones, UA: NEGATIVE
Leukocytes, UA: NEGATIVE
Nitrite, UA: NEGATIVE
Protein, UA: NEGATIVE
Spec Grav, UA: 1.015
Urobilinogen, UA: 0.2
pH, UA: 5

## 2015-01-10 LAB — TSH: TSH: 2.692 u[IU]/mL (ref 0.350–4.500)

## 2015-01-10 MED ORDER — ROSUVASTATIN CALCIUM 10 MG PO TABS: 10.0000 mg | ORAL_TABLET | Freq: Every day | ORAL | Status: DC

## 2015-01-10 NOTE — Progress Notes (Signed)
Subjective:    Patient ID: Veronica Martinez, female    DOB: Aug 20, 1956, 59 y.o.   MRN: [id number]  HPI  This is a 59 year old female with PMH HLD who is presenting for follow up and medication refills.  HLD: stable on crestor 10 mg. No problems. Needs refill of medication today.  Abdominal pain: She has been having lower abdominal pain x 1 year. At first it was intermittent but is not constant. Feels like cramps and "about to come on my period". Nothing helps. She is also having dyspareunia - doesn't like to have sex anymore. She has been seen by GYN. Last saw GYN 1 week ago. Lower abdominal and transvaginal U/S did not show anything. GYN thinks it is adhesions from her prior total hysterectomy. She denies urinary sx, changes in bowel movements, unintentional weight loss, blood in stool or urine, N/V/D, vaginal discharge or bleeding. She is not a smoker. She is retired but used to work in [employer]. She is due for colonoscopy this year. She does admit to some stress incontinence for the past 2 years. She wears pads daily. She leaks urine when she coughs/sneezes/laughs.   She is due for tdap and flu vaccine.  She had mammogram last week - showed right breast calcifications and possible left breast mass. She is going back in 2 days for bilateral breast U/S and diagnostic mammogram.  She walks 1 hour per day and has been losing weight and is happy with this.  Review of Systems  Constitutional: Negative for fever, chills and unexpected weight change.  Eyes: Negative for visual disturbance.  Respiratory: Negative for shortness of breath.   Cardiovascular: Negative for chest pain, palpitations and leg swelling.  Gastrointestinal: Positive for abdominal pain. Negative for nausea, vomiting, diarrhea, constipation and blood in stool.  Genitourinary: Positive for dyspareunia. Negative for dysuria, frequency, vaginal bleeding and vaginal discharge.  Skin: Negative for rash.  Psychiatric/Behavioral:  Negative for sleep disturbance and dysphoric mood.    Patient Active Problem List   Diagnosis Date Noted  . 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 Right hip pain 08/09/2013  . Trigger finger, acquired 04/22/2013  . Carpal tunnel syndrome of right wrist 04/22/2013  . Pain of right heel 12/08/2012  . Myalgia 11/10/2012  . OBESITY, UNSPECIFIED 02/20/2011  . BACK PAIN, LUMBAR 07/05/2010  . ABNORMALITY OF GAIT 05/02/2010  . MERALGIA PARESTHETICA 03/02/2010  . HIP PAIN, BILATERAL 03/02/2010   Prior to Admission medications   Medication Sig Start Date End Date Taking? Authorizing Provider  amitriptyline (ELAVIL) 25 MG tablet Take 25 mg by mouth at bedtime.   Yes Historical Provider, MD  benzonatate (TESSALON) 100 MG capsule Take 1-2 capsules (100-200 mg total) by mouth 3 (three) times daily as needed. 08/29/14  Yes Posey Boyer, MD  Cholecalciferol (VITAMIN D) 2000 UNITS CAPS Take 2,000 Units by mouth daily.    Yes Historical Provider, MD  Coenzyme Q10 (CO Q 10 PO) Take 75 mg by mouth 3 (three) times daily.   Yes Historical Provider, MD  fluticasone (FLONASE) 50 MCG/ACT nasal spray Place 2 sprays into the nose daily.   Yes Historical Provider, MD  gabapentin (NEURONTIN) 600 MG tablet Take 1 tablet (600 mg total) by mouth 3 (three) times daily. 09/14/14  Yes Stefanie Libel, MD  levothyroxine (SYNTHROID, LEVOTHROID) 112 MCG tablet Take 100 mcg by mouth daily before breakfast.    Yes Historical Provider, MD  meloxicam (MOBIC) 15 MG tablet Take 15 mg  by mouth daily.   Yes Historical Provider, MD  Misc Natural Products (CALCIUM PLUS ADVANCED PO) Take 600 mg by mouth 2 (two) times daily.    Yes Historical Provider, MD  Multiple Vitamin (MULTIVITAMIN) tablet Take 1 tablet by mouth daily.    Yes Historical Provider, MD  Omega-3 Fatty Acids (FISH OIL) 1200 MG CAPS Take 1,200 mg by mouth 3 (three) times daily.    Yes Historical Provider, MD  pantoprazole (PROTONIX) 40 MG tablet Take 40 mg by  mouth daily.    Yes Historical Provider, MD  predniSONE (DELTASONE) 20 MG tablet Take 1 tablet (20 mg total) by mouth daily with breakfast. 08/29/14  Yes Posey Boyer, MD  traMADol (ULTRAM) 50 MG tablet Take 50 mg by mouth every 6 (six) hours as needed.   Yes Historical Provider, MD                rosuvastatin (CRESTOR) 10 MG tablet Take 1 tablet (10 mg total) by mouth daily. 01/10/15   Ezekiel Slocumb, PA-C   Allergies  Allergen Reactions  . Meloxicam Other (See Comments)    Reaction unknown  . Pravastatin Sodium Other (See Comments)    Reaction unknown  . Simvastatin Other (See Comments)    Reaction unknown  . Tramadol Hcl Other (See Comments)    Reaction unknown   Patient's social and family history were reviewed.     Objective:   Physical Exam  Constitutional: She is oriented to person, place, and time. She appears well-developed and well-nourished. No distress.  HENT:  Head: Normocephalic and atraumatic.  Right Ear: Hearing, tympanic membrane, external ear and ear canal normal.  Left Ear: Hearing, tympanic membrane, external ear and ear canal normal.  Nose: Nose normal.  Mouth/Throat: Uvula is midline, oropharynx is clear and moist and mucous membranes are normal.  Eyes: Conjunctivae, EOM and lids are normal. Right eye exhibits no discharge. Left eye exhibits no discharge. No scleral icterus.  Neck: Carotid bruit is not present.  Cardiovascular: Normal rate, regular rhythm, normal heart sounds, intact distal pulses and normal pulses.   Pulmonary/Chest: Effort normal and breath sounds normal. No respiratory distress. She has no wheezes. She has no rhonchi. She has no rales.  Abdominal: Soft. Normal appearance and bowel sounds are normal. There is tenderness in the suprapubic area. There is no CVA tenderness, no tenderness at McBurney's point and negative Murphy's sign.  Musculoskeletal: Normal range of motion.  Lymphadenopathy:    She has no cervical adenopathy.  Neurological:  She is alert and oriented to person, place, and time.  Skin: Skin is warm, dry and intact. No lesion and no rash noted.  Psychiatric: She has a normal mood and affect. Her speech is normal and behavior is normal. Thought content normal.   BP 150/80 mmHg  Pulse 84  Temp(Src) 97.9 F (36.6 C) (Oral)  Resp 20  Ht 5' 4.25" (1.632 m)  Wt 246 lb 6.4 oz (111.766 kg)  BMI 41.96 kg/m2  SpO2 98%     Assessment & Plan:  1. Flu vaccine need - Flu Vaccine QUAD 36+ mos IM  2. Need for Tdap vaccination - Tdap vaccine greater than or equal to 7yo IM  3. Lower abdominal pain 4. Colon cancer screening Could be d/t adhesions from prior hysterectomy. Per pt pelvic and transvaginal U/S normal. Prior labs have been normal. Will send for abdominal/pelvis CT and colon cancer screening. - TSH - CBC - POCT UA - Microscopic Only - POCT  urinalysis dipstick -CT abdomen pelvis W contrast - Ambulatory referral to Gastroenterology  5. Hyperlipidemia - Comprehensive metabolic panel - Lipid panel - rosuvastatin (CRESTOR) 10 MG tablet; Take 1 tablet (10 mg total) by mouth daily.  Dispense: 90 tablet; Refill: 3  6. Breast calcification seen on mammogram Follow up with breast U/S and diagnostic mammogram in 2 days.  Benjaman Pott Drenda Freeze, MHS [facility]  01/10/2015

## 2015-01-10 NOTE — Telephone Encounter (Signed)
Pt states that she was suppose to get pain med.  Can we get her something?

## 2015-01-10 NOTE — Patient Instructions (Signed)
You will get a call to schedule your colonoscopy and CT scan. You will get called with the results of your tests today. Continue with exercise and weight loss - you're doing a great job! Return with any problems/concerns

## 2015-01-11 MED ORDER — GABAPENTIN 800 MG PO TABS: 800.0000 mg | ORAL_TABLET | Freq: Three times a day (TID) | ORAL | Status: DC

## 2015-01-11 NOTE — Telephone Encounter (Signed)
Veronica Martinez, Apparently she is allergic to Ultram. We will have to call in a different pain med. Please call pt so that we can do that.

## 2015-01-11 NOTE — Telephone Encounter (Signed)
Elmyra Ricks did you tell Gabriel Cirri she was going to get some medication for pain. Please call her in  and something she can take for her discomfort. Maybe Ultram might be reasonable. Thank you. Please forward this note to [place].

## 2015-01-11 NOTE — Telephone Encounter (Signed)
I spoke with the pt and we agreed to increase her current neurontin 600 mg TID to 800 mg TID. She currently takes neurontin for hip pain. She also has mobic 15 mg for back pain and amitriptyline that she takes at night. She will let us know if this does not help.

## 2015-01-12 ENCOUNTER — Ambulatory Visit
Admission: RE | Admit: 2015-01-12 | Discharge: 2015-01-12 | Disposition: A | Discharge status: Home / Self Care | Payer: Medicare Other | Source: Ambulatory Visit | Attending: Obstetrics and Gynecology | Admitting: Obstetrics and Gynecology

## 2015-01-12 DIAGNOSIS — R928 Other abnormal and inconclusive findings on diagnostic imaging of breast: Secondary | ICD-10-CM

## 2015-01-17 ENCOUNTER — Telehealth: Payer: Self-pay | Admitting: Physician Assistant

## 2015-01-19 NOTE — Telephone Encounter (Signed)
Opened on error

## 2015-01-26 ENCOUNTER — Ambulatory Visit
Admission: RE | Admit: 2015-01-26 | Discharge: 2015-01-26 | Disposition: A | Discharge status: Home / Self Care | Payer: Medicare Other | Source: Ambulatory Visit | Attending: Physician Assistant | Admitting: Physician Assistant

## 2015-01-26 DIAGNOSIS — R1030 Lower abdominal pain, unspecified: Secondary | ICD-10-CM

## 2015-01-26 MED ORDER — IOHEXOL 300 MG/ML  SOLN
125.0000 mL | Freq: Once | INTRAMUSCULAR | Status: AC | PRN
  Administered 2015-01-26: 125 mL via INTRAVENOUS

## 2015-01-31 ENCOUNTER — Telehealth: Payer: Self-pay | Admitting: Physician Assistant

## 2015-01-31 NOTE — Telephone Encounter (Signed)
Spoke with pt about CT results. She will follow up with GI and will mention area on pancreas and see if they think it needs to be followed up. If they feel her pain is not related to GI cause, will refer pt to PT for pelvic pain. Pt reports she continues to have considerable pain.

## 2015-05-18 ENCOUNTER — Other Ambulatory Visit: Payer: Self-pay | Admitting: *Deleted

## 2015-05-18 ENCOUNTER — Telehealth: Payer: Self-pay | Admitting: *Deleted

## 2015-05-18 DIAGNOSIS — R1030 Lower abdominal pain, unspecified: Secondary | ICD-10-CM

## 2015-05-18 MED ORDER — GABAPENTIN 600 MG PO TABS: 600.0000 mg | ORAL_TABLET | Freq: Three times a day (TID) | ORAL | Status: DC

## 2015-05-18 NOTE — Telephone Encounter (Signed)
Received a rx for gabapentin 600mg .  The system showed gabapentin 800mg , the patient said this dose was prescribed by Bennett Scrape PA, but that she couldn't take that high of a dose.  So she wanted to continue with the 600mg  TID, per Dr. [place] Alar this is ok to refill.  It was phoned into the [facility]

## 2015-07-17 ENCOUNTER — Other Ambulatory Visit: Payer: Self-pay | Admitting: *Deleted

## 2015-07-17 DIAGNOSIS — R1030 Lower abdominal pain, unspecified: Secondary | ICD-10-CM

## 2015-07-17 MED ORDER — GABAPENTIN 600 MG PO TABS: 600.0000 mg | ORAL_TABLET | Freq: Three times a day (TID) | ORAL | Status: DC

## 2015-07-29 ENCOUNTER — Ambulatory Visit (INDEPENDENT_AMBULATORY_CARE_PROVIDER_SITE_OTHER): Payer: Medicare Other | Admitting: Emergency Medicine

## 2015-07-29 VITALS — BP 118/68 | HR 97 | Temp 98.3°F | Ht 64.0 in | Wt 250.0 lb

## 2015-07-29 DIAGNOSIS — H0013 Chalazion right eye, unspecified eyelid: Secondary | ICD-10-CM

## 2015-07-29 MED ORDER — POLYMYXIN B-TRIMETHOPRIM 10000-0.1 UNIT/ML-% OP SOLN: 2.0000 [drp] | OPHTHALMIC | Status: DC

## 2015-07-29 NOTE — Patient Instructions (Signed)
Chalazion A chalazion is a swelling or hard lump on the eyelid caused by a blocked oil gland. Chalazions may occur on the upper or the lower eyelid.  CAUSES  Oil gland in the eyelid becomes blocked. SYMPTOMS   Swelling or hard lump on the eyelid. This lump may make it hard to see out of the eye.  The swelling may spread to areas around the eye. TREATMENT   Although some chalazions disappear by themselves in 1 or 2 months, some chalazions may need to be removed.  Medicines to treat an infection may be required. HOME CARE INSTRUCTIONS   Wash your hands often and dry them with a clean towel. Do not touch the chalazion.  Apply heat to the eyelid several times a day for 10 minutes to help ease discomfort and bring any yellowish white fluid (pus) to the surface. One way to apply heat to a chalazion is to use the handle of a metal spoon.  Hold the handle under hot water until it is hot, and then wrap the handle in paper towels so that the heat can come through without burning your skin.  Hold the wrapped handle against the chalazion and reheat the spoon handle as needed.  Apply heat in this fashion for 10 minutes, 4 times per day.  Return to your caregiver to have the pus removed if it does not break (rupture) on its own.  Do not try to remove the pus yourself by squeezing the chalazion or sticking it with a pin or needle.  Only take over-the-counter or prescription medicines for pain, discomfort, or fever as directed by your caregiver. SEEK IMMEDIATE MEDICAL CARE IF:   You have pain in your eye.  Your vision changes.  The chalazion does not go away.  The chalazion becomes painful, red, or swollen, grows larger, or does not start to disappear after 2 weeks. MAKE SURE YOU:   Understand these instructions.  Will watch your condition.  Will get help right away if you are not doing well or get worse. Document Released: 12/06/2000 Document Revised: 03/02/2012 Document Reviewed:  03/26/2010 ExitCare Patient Information 2015 [employer]. This information is not intended to replace advice given to you by your health care provider. Make sure you discuss any questions you have with your health care provider.  

## 2015-07-29 NOTE — Progress Notes (Signed)
Subjective:  Patient ID: Veronica Martinez, female    DOB: 05-11-1956  Age: 59 y.o. MRN: [id number]  CC: Belepharitis   HPI [name] presents  with swelling of her lower lid on the right side. He said she has some drainage. No pain or difficulty with vision. The lower lid is somewhat pruritic. She has no nasal congestion postnasal drainage fever chills or ear pain. She denies any history of injury or overuse. He has no improvement with over-the-counter medication  History [name] has a past medical history of Allergy; History of Graves' disease; radioactive iodine thyroid ablation; Hypothyroidism; GERD (gastroesophageal reflux disease); High cholesterol; and Headache(784.0).   She has past surgical history that includes Shoulder surgery; Appendectomy; Joint replacement; Abdominal hysterectomy; Hip Arthroplasty (Bilateral); Other surgical history (Right); Cataract extraction w/PHACO (Right, 09/22/2013); Eye surgery; Tonsillectomy; Colonoscopy; and Cataract extraction w/PHACO (Left, 12/29/2013).   Her  family history is not on file.  She   reports that she has never smoked. She has never used smokeless tobacco. She reports that she does not drink alcohol or use illicit drugs.  Outpatient Prescriptions Prior to Visit  Medication Sig Dispense Refill  . Cholecalciferol (VITAMIN D) 2000 UNITS CAPS Take 2,000 Units by mouth daily.     . Coenzyme Q10 (CO Q 10 PO) Take 75 mg by mouth 3 (three) times daily.    . fluticasone (FLONASE) 50 MCG/ACT nasal spray Place 2 sprays into the nose daily.    [name] gabapentin (NEURONTIN) 600 MG tablet Take 1 tablet (600 mg total) by mouth 3 (three) times daily. 90 tablet 1  . gatifloxacin (ZYMAXID) 0.5 % SOLN Place 1 drop into the left eye 3 (three) times daily.    [name] levothyroxine (SYNTHROID, LEVOTHROID) 112 MCG tablet Take 100 mcg by mouth daily before breakfast.     . Misc Natural Products (CALCIUM PLUS ADVANCED PO) Take 600 mg by mouth 2 (two) times daily.      . Multiple Vitamin (MULTIVITAMIN) tablet Take 1 tablet by mouth daily.     . Omega-3 Fatty Acids (FISH OIL) 1200 MG CAPS Take 1,200 mg by mouth 3 (three) times daily.     . pantoprazole (PROTONIX) 40 MG tablet Take 40 mg by mouth daily.     . rosuvastatin (CRESTOR) 10 MG tablet Take 1 tablet (10 mg total) by mouth daily. 90 tablet 3  . amitriptyline (ELAVIL) 25 MG tablet Take 25 mg by mouth at bedtime.    . benzonatate (TESSALON) 100 MG capsule Take 1-2 capsules (100-200 mg total) by mouth 3 (three) times daily as needed. 30 capsule 0  . meloxicam (MOBIC) 15 MG tablet Take 15 mg by mouth daily.    . prednisoLONE acetate (PRED FORTE) 1 % ophthalmic suspension 1 drop 3 (three) times daily.    . predniSONE (DELTASONE) 20 MG tablet Take 1 tablet (20 mg total) by mouth daily with breakfast. 12 tablet 0   No facility-administered medications prior to visit.    History   Social History  . Marital Status: Married    Spouse Name: N/A  . Number of Children: N/A  . Years of Education: N/A   Social History Main Topics  . Smoking status: Never Smoker   . Smokeless tobacco: Never Used  . Alcohol Use: No  . Drug Use: No  . Sexual Activity:    Partners: Male   Other Topics Concern  . None   Social History Narrative   Married. Education: [employer].  Review of Systems  Constitutional: Negative for fever, chills and appetite change.  HENT: Negative for congestion, ear pain, postnasal drip, sinus pressure and sore throat.   Eyes: Negative for pain and redness.  Respiratory: Negative for cough, shortness of breath and wheezing.   Cardiovascular: Negative for leg swelling.  Gastrointestinal: Negative for nausea, vomiting, abdominal pain, diarrhea, constipation and blood in stool.  Endocrine: Negative for polyuria.  Genitourinary: Negative for dysuria, urgency, frequency and flank pain.  Musculoskeletal: Negative for gait problem.  Skin: Negative for rash.  Neurological: Negative for  weakness and headaches.  Psychiatric/Behavioral: Negative for confusion and decreased concentration. The patient is not nervous/anxious.     Objective:  BP 118/68 mmHg  Pulse 97  Temp(Src) 98.3 F (36.8 C) (Oral)  Ht 5\' 4"  (1.626 m)  Wt 250 lb (113.399 kg)  BMI 42.89 kg/m2  SpO2 96%  Physical Exam  Constitutional: She is oriented to person, place, and time. She appears well-developed and well-nourished.  HENT:  Head: Normocephalic and atraumatic.  Eyes: Conjunctivae are normal. Pupils are equal, round, and reactive to light. Right eye exhibits hordeolum.  Pulmonary/Chest: Effort normal.  Musculoskeletal: She exhibits no edema.  Neurological: She is alert and oriented to person, place, and time.  Skin: Skin is dry.  Psychiatric: She has a normal mood and affect. Her behavior is normal. Thought content normal.      Assessment & Plan:   Mollye was seen today for belepharitis.  Diagnoses and all orders for this visit:  Chalazion, right Orders: -     Ambulatory referral to Ophthalmology  Other orders -     trimethoprim-polymyxin b (POLYTRIM) ophthalmic solution; Place 2 drops into the right eye every 4 (four) hours.   I have discontinued Ms. Lusk's amitriptyline, prednisoLONE acetate, meloxicam, predniSONE, and benzonatate. I am also having her start on trimethoprim-polymyxin b. Additionally, I am having her maintain her pantoprazole, Misc Natural Products (CALCIUM PLUS ADVANCED PO), Vitamin D, multivitamin, Fish Oil, levothyroxine, fluticasone, Coenzyme Q10 (CO Q 10 PO), gatifloxacin, rosuvastatin, gabapentin, vitamin E, cyanocobalamin, and naproxen.  Meds ordered this encounter  Medications  . vitamin E 400 UNIT capsule    Sig: Take 400 Units by mouth daily.  . cyanocobalamin 2000 MCG tablet    Sig: Take 2,000 mcg by mouth daily.  . naproxen (NAPROSYN) 500 MG tablet    Sig: Take 500 mg by mouth 2 (two) times daily with a meal.  . trimethoprim-polymyxin b (POLYTRIM)  ophthalmic solution    Sig: Place 2 drops into the right eye every 4 (four) hours.    Dispense:  10 mL    Refill:  0    Appropriate red flag conditions were discussed with the patient as well as actions that should be taken.  Patient expressed his understanding.  Follow-up: Return if symptoms worsen or fail to improve.  Roselee Culver, MD

## 2015-07-31 ENCOUNTER — Other Ambulatory Visit: Payer: Self-pay | Admitting: *Deleted

## 2015-08-16 ENCOUNTER — Encounter: Payer: Self-pay | Admitting: Sports Medicine

## 2015-08-16 ENCOUNTER — Ambulatory Visit (INDEPENDENT_AMBULATORY_CARE_PROVIDER_SITE_OTHER): Payer: Medicare Other | Admitting: Sports Medicine

## 2015-08-16 VITALS — BP 141/56 | HR 71 | Ht 66.0 in | Wt 243.0 lb

## 2015-08-16 DIAGNOSIS — G5710 Meralgia paresthetica, unspecified lower limb: Secondary | ICD-10-CM

## 2015-08-16 NOTE — Patient Instructions (Signed)
Please perform the low back stretches as shown to you in the clinic today. Please do sets of 5 for each stretch. This will help take pressure off your nerve. You can continue taking the naproxen. When you run out of this medication, you can take over the counter alleve instead.  Please contact Dr Hassell Done and [facility] for discussion about surgical options for weight loss.   Please follow up in about 6 weeks if your symptoms are not improving or worsening.  Take care,  Dr Jerline Pain

## 2015-08-16 NOTE — Assessment & Plan Note (Addendum)
Right leg pain likely due to meralgia paresthetica. Additionally, pain may be compounded by relatively recent low back problems. Will continue gabapentin at 600mg  three times daily. Will also continue naproxen. Demonstrated several low back stretches in the office today that the patient will perform at home. Discussed weight loss with patient. Will follow up in 6 weeks.  She may consider seeing weight loss surgeon

## 2015-08-16 NOTE — Progress Notes (Signed)
    Subjective:  Veronica Martinez is a 59 y.o. female who presents to the [facility] today with a chief complaint of right thigh pain.   HPI: Right Thigh Pain Patient has a history of bilateral hip replacement and meralgia paresthetica who presents with approximately 3 weeks of right thigh pain that is similar in nature to prior episodes of meralgia paresthetica. Pain is mostly located in the superior anterior aspect of her right thigh and described as a "charlie horse" type sensation. Pain does not radiate. No numbness or tingling in that area. Pain lasts for 1 to 1.5 days. Has been on gabapentin 600mg  three times daily which helps with the pain. Additional has been wearing an under garmet which lifts her pannus off her inguinal crease, which has helped with the pain some.   Patient also reports that she recent has been having low back pain that radiates into the lateral aspect of her right thigh. Reports having an MRI performed (images not available in our system) that show "arthritis" in her low back. Patient's PCP tried to increase her gabapentin dose to 800mg  three times daily, however the patient was intolerant and had her dose reduced back to 600mg  three times daily. Patient was started on naproxen which has helped the pain.   ROS: No fevers or chills, otherwise all systems reviewed and are negative.  Objective:  Physical Exam: BP 141/56 mmHg  Pulse 71  Ht 5\' 6"  (1.676 m)  Wt 243 lb (110.224 kg)  BMI 39.24 kg/m2  Gen: NAD, resting comfortably MSK: -Right hip: No deformities noted. No pain with internal or external rotation. 5/5 hip flexion and knee flexion. Negative straight leg raise.  -Left hip: No deformities noted. No pain with internal or external rotation. 5/5 hip flexion and knee flexion. Negative straight leg raise. -Back: No deformities. Nontender. Skin: warm, dry Neuro: grossly normal, moves all extremities Psych: Normal affect and thought content  Assessment/Plan:  MERALGIA  PARESTHETICA Right leg pain likely due to meralgia paresthetica. Additionally, pain may be compounded by relatively recent low back problems. Will continue gabapentin at 600mg  three times daily. Will also continue naproxen. Demonstrated several low back stretches in the office today that the patient will perform at home. Discussed weight loss with patient. Will follow up in 6 weeks.    Algis Greenhouse. Jerline Pain, [place] Resident PGY-2 08/16/2015 10:16 AM   Agree with eval and assessment   Ila Mcgill, MD

## 2015-09-14 IMAGING — CT CT ABD-PELV W/ CM
3 of 5 series · 12 of 36 positions shown, 18 images · IV contrast (READICAT/WATER & [ID] OMNI 300)
Comparison: CT Abdomen and Pelvis 03/11/2004. Lumbar MRI
07/31/2014.

CLINICAL DATA: 58-year-old female lower abdominal pain. Previous
appendectomy. Subsequent encounter.

EXAM:
CT ABDOMEN AND PELVIS WITH CONTRAST
TECHNIQUE: Multidetector CT imaging of the abdomen and pelvis was performed
using the standard protocol following bolus administration of
intravenous contrast.
CONTRAST:  125mL OMNIPAQUE IOHEXOL 300 MG/ML  SOLN

[Series 3: abd/pelvis with · axial · 0.92mm/px · z∈[-280,+20]mm · 6 of 86 slices shown, 11 images]
[im 13/86  soft-tissue]
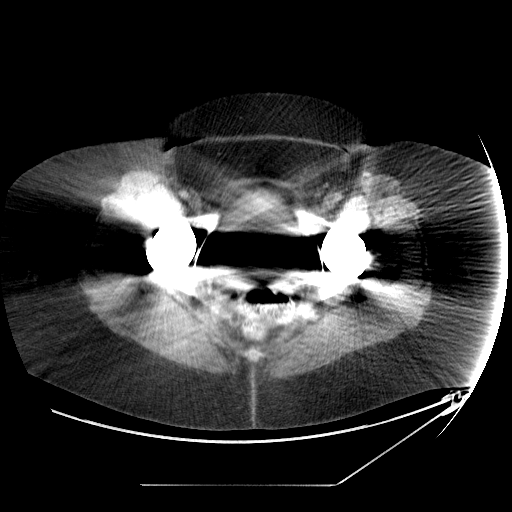
[im 13/86  bone]
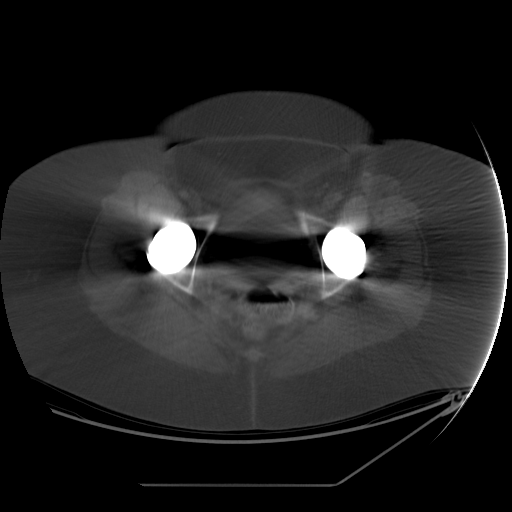
[im 25/86  soft-tissue]
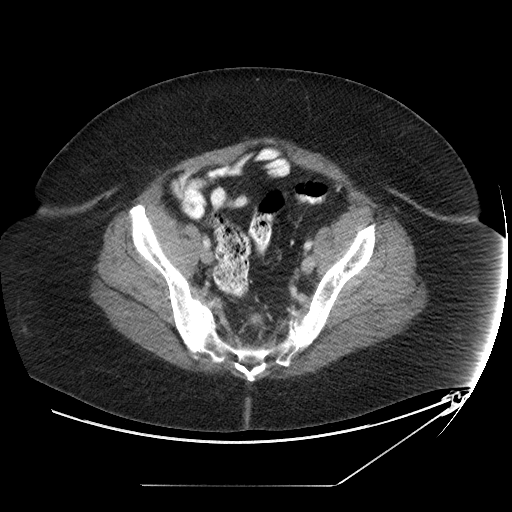
[im 37/86  soft-tissue]
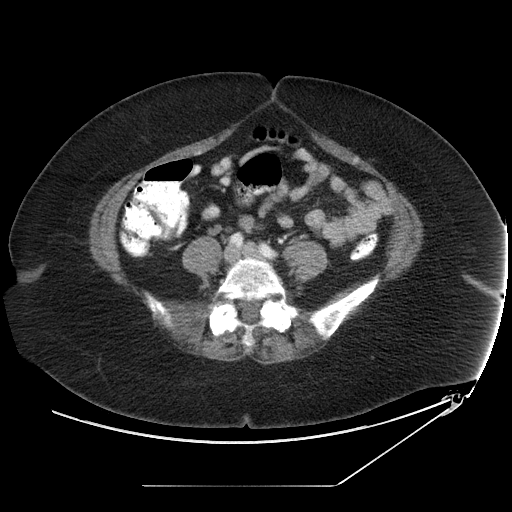
[im 37/86  lung]
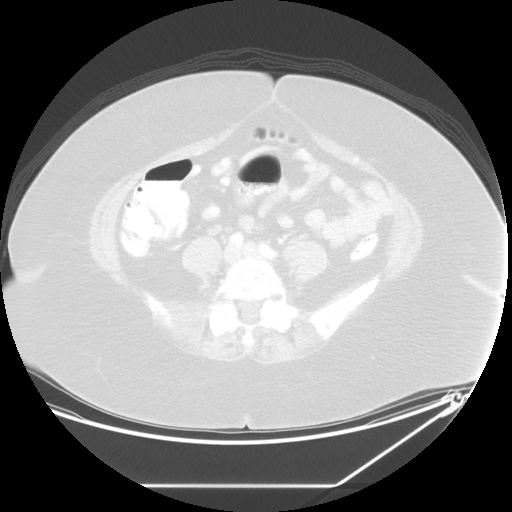
[im 49/86  soft-tissue]
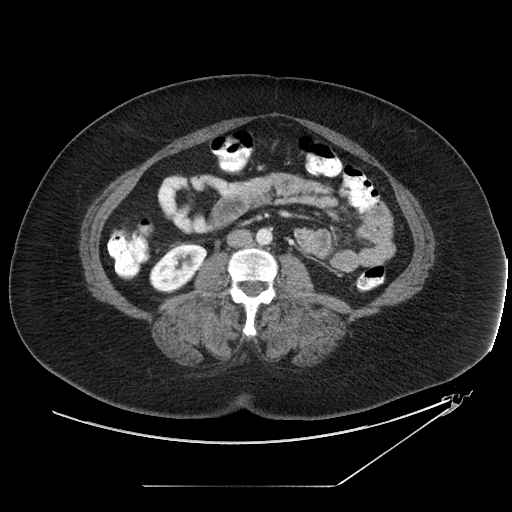
[im 49/86  lung]
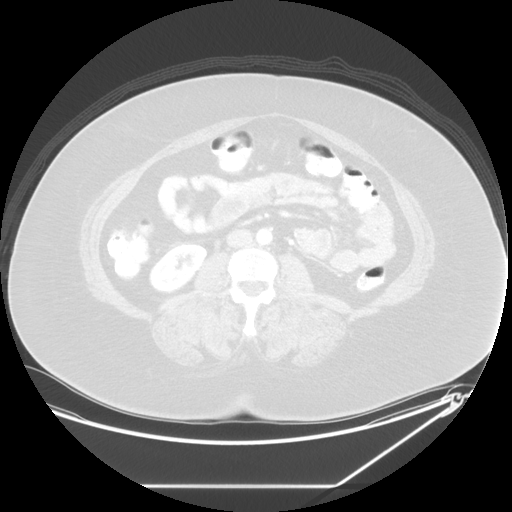
[im 61/86  soft-tissue]
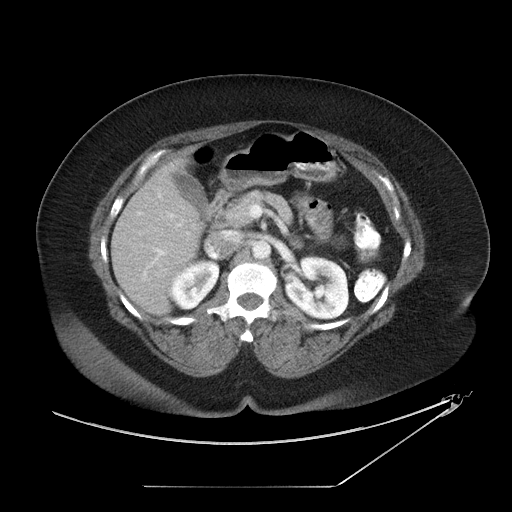
[im 61/86  lung]
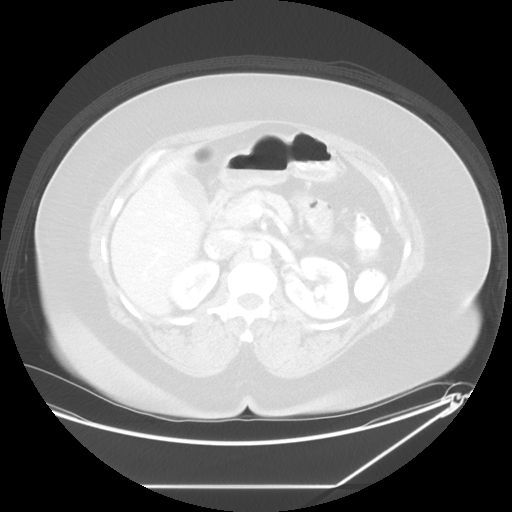
[im 73/86  soft-tissue]
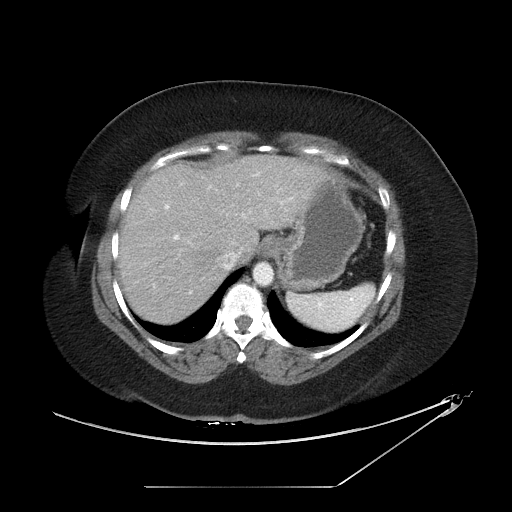
[im 73/86  lung]
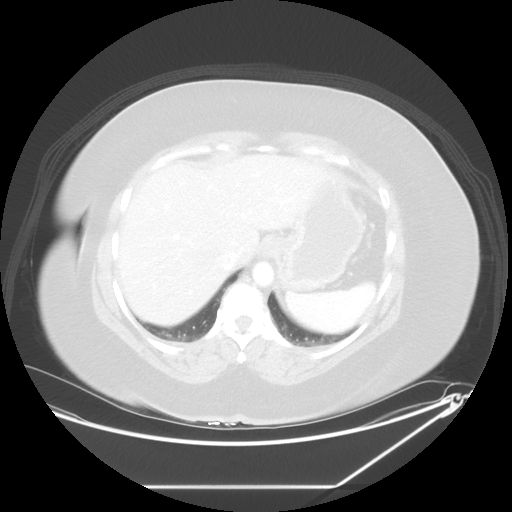

[Series 601: coronal body · coronal · 0.92mm/px · 1 of 138 slices shown, 2 images]
[im 46/138  soft-tissue]
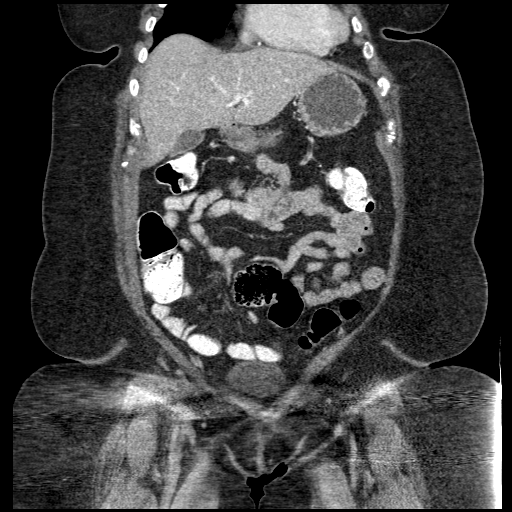
[im 46/138  bone]
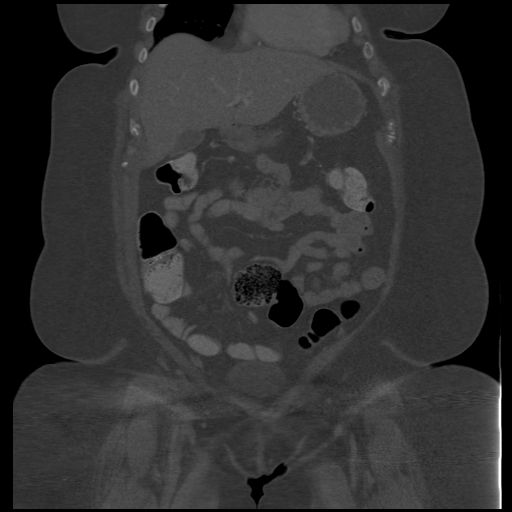

[Series 602: sagittal body · sagittal · 0.92mm/px · 5 of 188 slices shown]
[im 21/188  soft-tissue]
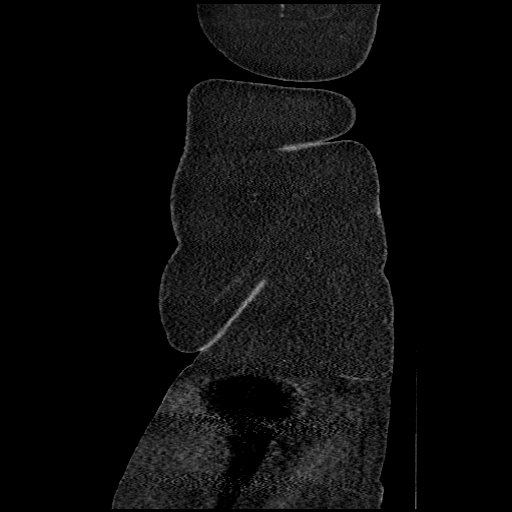
[im 42/188  soft-tissue]
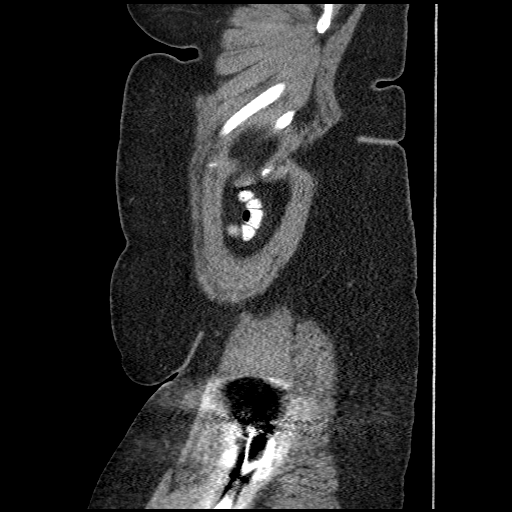
[im 63/188  soft-tissue]
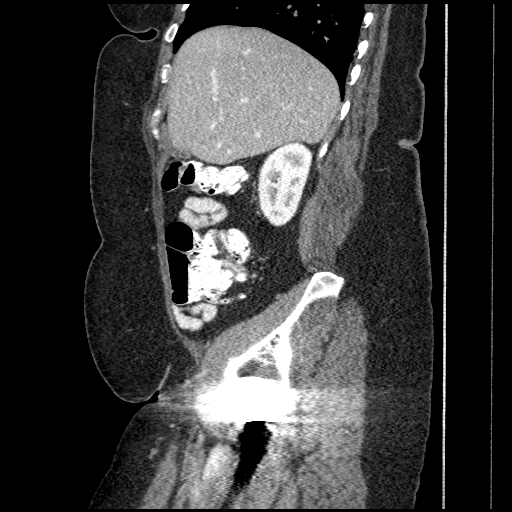
[im 84/188  soft-tissue]
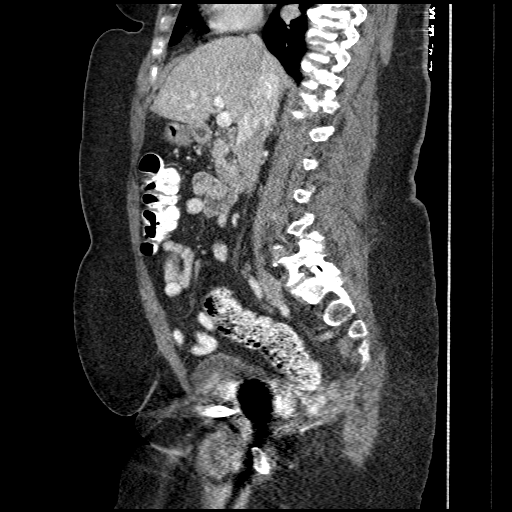
[im 104/188  soft-tissue]
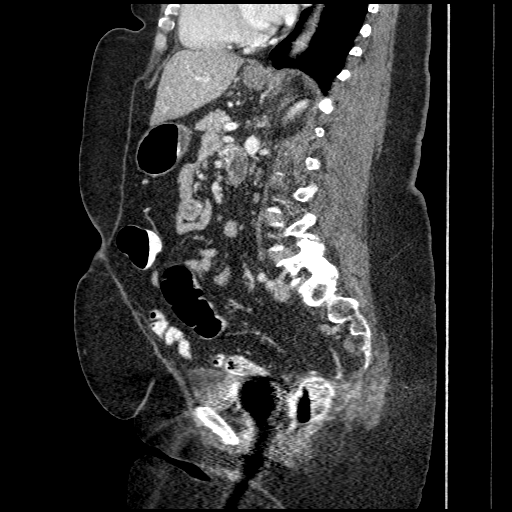

[12 of 36 positions shown; findings below may reference images not displayed]

FINDINGS: Lower lung volumes. Mild bibasilar atelectasis. Mild cardiomegaly.
No pericardial or pleural effusion.

Severe lower lumbar facet degeneration. Facet spurring and vacuum
phenomena. Bilateral hip arthroplasties. No acute osseous
abnormality identified.

Streak artifact through the pelvis related to the bilateral hip
hardware, limiting visualization of the bladder and lower pelvis.

Oral contrast has reached the rectum without obstruction. Pelvic
fluid collections in 6997 have resolved. Visible bladder
unremarkable.

Redundant sigmoid colon. Negative left colon. Negative right colon.
There is a residual appendix identified (series 3, images 55 and
56), which appears normal. Negative terminal ileum. No dilated or
inflamed small bowel. Negative stomach and duodenum.

Liver, gallbladder, spleen, adrenal glands, and portal venous system
are within normal limits. Major arterial structures in the abdomen
and pelvis appear normal. No abdominal free fluid.

There is hypodensity at the head and uncinate of the pancreas on
series 3, image 29, however, a similar appearance is noted in 6997
(series 2, images 28 and 29 at that time). This measures up to 13 mm
currently. Pancreatic parenchyma otherwise is within normal limits;
mildly atrophied since 6997. No lymphadenopathy.

Negative kidneys with normal and symmetric contrast excretion on
delayed images.
IMPRESSION: 1. No acute or inflammatory process in the abdomen or pelvis. Normal
appearing residual appendix noted.
2. Small chronic hypodensity in the head of the pancreas only mildly
increased since 6997, compatible with indolent pancreatic cystic
lesion.
Given the minimal growth over 10 years, the value of additional
imaging followup is doubtful. Followup abdomen MRI in 1 year
(pancreatic protocol without and with contrast) could be considered
as per the guidelines "Managing Incidental Findings on Abdominal CT:
White Paper of the ACR Incidental Findings Committee. [HOSPITAL] 4404;[DATE]. "

## 2015-09-26 ENCOUNTER — Encounter: Payer: Self-pay | Admitting: Emergency Medicine

## 2015-09-27 ENCOUNTER — Other Ambulatory Visit: Payer: Self-pay | Admitting: Sports Medicine

## 2015-12-04 ENCOUNTER — Other Ambulatory Visit: Payer: Self-pay | Admitting: *Deleted

## 2015-12-04 ENCOUNTER — Other Ambulatory Visit: Payer: Self-pay | Admitting: Sports Medicine

## 2015-12-05 ENCOUNTER — Other Ambulatory Visit: Payer: Self-pay | Admitting: Sports Medicine

## 2015-12-11 ENCOUNTER — Other Ambulatory Visit: Payer: Self-pay | Admitting: Family Medicine

## 2015-12-26 ENCOUNTER — Telehealth: Payer: Self-pay | Admitting: *Deleted

## 2015-12-26 ENCOUNTER — Encounter: Payer: Self-pay | Admitting: Emergency Medicine

## 2015-12-26 ENCOUNTER — Ambulatory Visit (INDEPENDENT_AMBULATORY_CARE_PROVIDER_SITE_OTHER): Payer: PPO | Admitting: Emergency Medicine

## 2015-12-26 VITALS — BP 135/71 | HR 73 | Temp 98.1°F | Resp 16 | Ht 66.0 in | Wt 244.0 lb

## 2015-12-26 DIAGNOSIS — R739 Hyperglycemia, unspecified: Secondary | ICD-10-CM

## 2015-12-26 DIAGNOSIS — E785 Hyperlipidemia, unspecified: Secondary | ICD-10-CM | POA: Diagnosis not present

## 2015-12-26 DIAGNOSIS — E038 Other specified hypothyroidism: Secondary | ICD-10-CM

## 2015-12-26 DIAGNOSIS — Z23 Encounter for immunization: Secondary | ICD-10-CM | POA: Diagnosis not present

## 2015-12-26 DIAGNOSIS — K219 Gastro-esophageal reflux disease without esophagitis: Secondary | ICD-10-CM | POA: Diagnosis not present

## 2015-12-26 DIAGNOSIS — M25561 Pain in right knee: Secondary | ICD-10-CM

## 2015-12-26 LAB — CBC WITH DIFFERENTIAL/PLATELET
Basophils Absolute: 0 10*3/uL (ref 0.0–0.1)
Basophils Relative: 0 % (ref 0–1)
Eosinophils Absolute: 0 10*3/uL (ref 0.0–0.7)
Eosinophils Relative: 1 % (ref 0–5)
HCT: 41.3 % (ref 36.0–46.0)
Hemoglobin: 14.2 g/dL (ref 12.0–15.0)
Lymphocytes Relative: 48 % — ABNORMAL HIGH (ref 12–46)
Lymphs Abs: 1.8 10*3/uL (ref 0.7–4.0)
MCH: 31.7 pg (ref 26.0–34.0)
MCHC: 34.4 g/dL (ref 30.0–36.0)
MCV: 92.2 fL (ref 78.0–100.0)
MPV: 10.5 fL (ref 8.6–12.4)
Monocytes Absolute: 0.4 10*3/uL (ref 0.1–1.0)
Monocytes Relative: 12 % (ref 3–12)
Neutro Abs: 1.4 10*3/uL — ABNORMAL LOW (ref 1.7–7.7)
Neutrophils Relative %: 39 % — ABNORMAL LOW (ref 43–77)
Platelets: 170 10*3/uL (ref 150–400)
RBC: 4.48 MIL/uL (ref 3.87–5.11)
RDW: 14.5 % (ref 11.5–15.5)
WBC: 3.7 10*3/uL — ABNORMAL LOW (ref 4.0–10.5)

## 2015-12-26 LAB — TSH: TSH: 1.671 u[IU]/mL (ref 0.350–4.500)

## 2015-12-26 LAB — BASIC METABOLIC PANEL WITH GFR
BUN: 9 mg/dL (ref 7–25)
CO2: 29 mmol/L (ref 20–31)
Calcium: 9.3 mg/dL (ref 8.6–10.4)
Chloride: 105 mmol/L (ref 98–110)
Creat: 0.73 mg/dL (ref 0.50–0.99)
GFR, Est African American: >89 mL/min (ref >=60)
GFR, Est Non African American: 89 mL/min (ref >=60)
Glucose, Bld: 96 mg/dL (ref 65–99)
Potassium: 4.3 mmol/L (ref 3.5–5.3)
Sodium: 142 mmol/L (ref 135–146)

## 2015-12-26 LAB — LIPID PANEL
Cholesterol: 138 mg/dL (ref 125–200)
HDL: 87 mg/dL (ref >=46)
LDL Cholesterol: 40 mg/dL (ref <130)
Total CHOL/HDL Ratio: 1.6 Ratio (ref <=5.0)
Triglycerides: 54 mg/dL (ref <150)
VLDL: 11 mg/dL (ref <30)

## 2015-12-26 MED ORDER — ZOSTER VACCINE LIVE 19400 UNT/0.65ML ~~LOC~~ SOLR: 0.6500 mL | Freq: Once | SUBCUTANEOUS | Status: DC

## 2015-12-26 NOTE — Telephone Encounter (Signed)
Called patient because Zostavax prescriptions for her and spouse were left at our office. I asked patient can I mailed them to home address and patient agreed.

## 2015-12-26 NOTE — Progress Notes (Signed)
Subjective:  This chart was scribed for Veronica Russian, MD by Tamsen Roers, at [facility].  This patient was seen in room 22 and the patient's care was started at 10:47 AM.    Patient ID: Veronica Martinez, female    DOB: [birth year], 60 y.o.   MRN: [id number] Chief Complaint  Patient presents with  . Hyperlipidemia    HPI  HPI Comments: Veronica Martinez is a 60 y.o. female who presents to the [facility] for a follow up.  She takes arthritis medication (over the counter) for her fingers as well as gabapentin for her nerve issue. She is compliant with her thyroid medication, Protonix and Crestor.    Patient Active Problem List   Diagnosis Date Noted  . 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 Right hip pain 08/09/2013  . Trigger finger, acquired 04/22/2013  . Carpal tunnel syndrome of right wrist 04/22/2013  . Pain of right heel 12/08/2012  . Myalgia 11/10/2012  . OBESITY, UNSPECIFIED 02/20/2011  . BACK PAIN, LUMBAR 07/05/2010  . ABNORMALITY OF GAIT 05/02/2010  . MERALGIA PARESTHETICA 03/02/2010  . HIP PAIN, BILATERAL 03/02/2010   Past Medical History  Diagnosis Date  . Allergy   . History of Graves' disease   . Hx of radioactive iodine thyroid ablation   . Hypothyroidism   . GERD (gastroesophageal reflux disease)   . High cholesterol   . Headache(784.0)     sinus   Past Surgical History  Procedure Laterality Date  . Shoulder surgery    . Appendectomy    . Joint replacement    . Abdominal hysterectomy    . Hip arthroplasty Bilateral   . Other surgical history Right     carpal tunel injection  . Cataract extraction w/phaco Right 09/22/2013    Procedure: CATARACT EXTRACTION PHACO AND INTRAOCULAR LENS PLACEMENT (IOC);  Surgeon: [name], MD;  Location: [facility];  Service: Ophthalmology;  Laterality: Right;  . Eye surgery    . Tonsillectomy    . Colonoscopy    . Cataract extraction w/phaco Left 12/29/2013   Procedure: CATARACT EXTRACTION PHACO AND INTRAOCULAR LENS PLACEMENT (IOC);  Surgeon: [name], MD;  Location: [facility];  Service: Ophthalmology;  Laterality: Left;   Allergies  Allergen Reactions  . Meloxicam Other (See Comments)    Reaction unknown  . Pravastatin Sodium Other (See Comments)    Reaction unknown  . Simvastatin Other (See Comments)    Reaction unknown  . Tramadol Hcl Other (See Comments)    Reaction unknown   Prior to Admission medications   Medication Sig Start Date End Date Taking? Authorizing Provider  Cholecalciferol (VITAMIN D) 2000 UNITS CAPS Take 2,000 Units by mouth daily.     Historical Provider, MD  Coenzyme Q10 (CO Q 10 PO) Take 75 mg by mouth 3 (three) times daily.    Historical Provider, MD  cyanocobalamin 2000 MCG tablet Take 2,000 mcg by mouth daily.    Historical Provider, MD  fluticasone (FLONASE) 50 MCG/ACT nasal spray Place 2 sprays into the nose daily.    Historical Provider, MD  fluticasone (FLONASE) 50 MCG/ACT nasal spray USE 2 SPRAYS IN BOTH NOSTRILS EVERY DAY 12/11/15   Posey Boyer, MD  gabapentin (NEURONTIN) 600 MG tablet TAKE ONE TABLET BY MOUTH THREE TIMES DAILY 12/04/15   Stefanie Libel, MD  Misc Natural Products (CALCIUM PLUS ADVANCED PO) Take 600 mg by mouth 2 (two) times daily.  Historical Provider, MD  Multiple Vitamin (MULTIVITAMIN) tablet Take 1 tablet by mouth daily.     Historical Provider, MD  naproxen (NAPROSYN) 500 MG tablet Take 500 mg by mouth 2 (two) times daily with a meal.    Historical Provider, MD  Omega-3 Fatty Acids (FISH OIL) 1200 MG CAPS Take 1,200 mg by mouth 3 (three) times daily.     Historical Provider, MD  pantoprazole (PROTONIX) 40 MG tablet Take 40 mg by mouth daily.     Historical Provider, MD  rosuvastatin (CRESTOR) 10 MG tablet Take 1 tablet (10 mg total) by mouth daily. 01/10/15   Bennett Scrape V, PA-C  SYNTHROID 100 MCG tablet Take 100 mcg by mouth daily. 07/15/15   Historical Provider, MD  trimethoprim-polymyxin  b (POLYTRIM) ophthalmic solution Place 2 drops into the right eye every 4 (four) hours. 07/29/15   Roselee Culver, MD  vitamin E 400 UNIT capsule Take 400 Units by mouth daily.    Historical Provider, MD   Social History   Social History  . Marital Status: Married    Spouse Name: N/A  . Number of Children: N/A  . Years of Education: N/A   Occupational History  . Not on file.   Social History Main Topics  . Smoking status: Never Smoker   . Smokeless tobacco: Never Used  . Alcohol Use: No  . Drug Use: No  . Sexual Activity:    Partners: Male   Other Topics Concern  . Not on file   Social History Narrative   Married. Education: [employer].         Review of Systems  Constitutional: Negative for fever and chills.  Eyes: Negative for pain, redness and itching.  Respiratory: Negative for cough and shortness of breath.   Gastrointestinal: Negative for nausea and vomiting.  Musculoskeletal: Negative for neck pain and neck stiffness.       Objective:   Physical Exam  Filed Vitals:   12/26/15 1028  BP: 135/71  Pulse: 73  Temp: 98.1 F (36.7 C)  Resp: 16  Height: 5\' 6"  (1.676 m)  Weight: 244 lb (110.678 kg)     CONSTITUTIONAL: Well developed/well nourished HEAD: Normocephalic/atraumatic EYES: EOMI/PERRL ENMT: Mucous membranes moist NECK: supple no meningeal signs SPINE/BACK:entire spine nontender CV: S1/S2 noted, no murmurs/rubs/gallops noted LUNGS: Lungs are clear to auscultation bilaterally, no apparent distress NEURO: Pt is awake/alert/appropriate, moves all extremitiesx4.  No facial droop.   EXTREMITIES: pulses normal/equal, full ROM SKIN: warm, color normal PSYCH: no abnormalities of mood noted, alert and oriented to situation       Assessment & Plan:   Blood pressure at goal. No change in medications. She overall is doing well with her current medication regimen. Prescription provided for her to receive shingles vaccine.I personally performed  the services described in this documentation, which was scribed in my presence. The recorded information has been reviewed and is accurate.Nena Jordan MD

## 2015-12-27 LAB — HEMOGLOBIN A1C
Hgb A1c MFr Bld: 6 % — ABNORMAL HIGH (ref <5.7)
Mean Plasma Glucose: 126 mg/dL — ABNORMAL HIGH (ref <117)

## 2016-01-03 ENCOUNTER — Other Ambulatory Visit: Payer: Self-pay | Admitting: Sports Medicine

## 2016-01-04 ENCOUNTER — Ambulatory Visit (INDEPENDENT_AMBULATORY_CARE_PROVIDER_SITE_OTHER): Payer: PPO | Admitting: Family Medicine

## 2016-01-04 VITALS — BP 163/91 | HR 58 | Temp 97.8°F | Resp 18 | Ht 66.0 in | Wt 244.0 lb

## 2016-01-04 DIAGNOSIS — M7061 Trochanteric bursitis, right hip: Secondary | ICD-10-CM | POA: Diagnosis not present

## 2016-01-04 MED ORDER — PREDNISONE 20 MG PO TABS: 20.0000 mg | ORAL_TABLET | Freq: Every day | ORAL | Status: DC

## 2016-01-04 MED ORDER — HYDROCODONE-ACETAMINOPHEN 5-325 MG PO TABS: 1.0000 | ORAL_TABLET | Freq: Four times a day (QID) | ORAL | Status: DC | PRN

## 2016-01-04 NOTE — Progress Notes (Signed)
By signing my name below, I, Moises Blood, attest that this documentation has been prepared under the direction and in the presence of Robyn Haber, MD. Electronically Signed: Moises Blood, [place]. 01/04/2016 , 1:35 PM .  Patient was seen in room 11 .   Patient ID: Veronica Martinez MRN: [id number], DOB: 05/10/1956, 60 y.o. Date of Encounter: 01/04/2016  Primary Physician: Jenny Reichmann, MD  Chief Complaint:  Chief Complaint  Patient presents with   Hip Pain    started this morning; left hip pain radiates to left leg   Leg Pain    left leg pain which started this morning    HPI:  Veronica Martinez is a 60 y.o. female who has h/o hip bursitis presents to [facility] complaining of sharp right hip pain that's been radiating down to her leg that started this morning. She's been icing her leg without much improvement. She mentions having double hip replacement back in 2004, done by Dr. Mayer Camel at [facility]. She is tears due to the pain.   She's unemployed.   Past Medical History  Diagnosis Date   Allergy    History of Graves' disease    Hx of radioactive iodine thyroid ablation    Hypothyroidism    GERD (gastroesophageal reflux disease)    High cholesterol    Headache(784.0)     sinus     Home Meds: Prior to Admission medications   Medication Sig Start Date End Date Taking? Authorizing Provider  Cholecalciferol (VITAMIN D) 2000 UNITS CAPS Take 2,000 Units by mouth daily.    Yes Historical Provider, MD  Coenzyme Q10 (CO Q 10 PO) Take 75 mg by mouth 3 (three) times daily.   Yes Historical Provider, MD  cyanocobalamin 2000 MCG tablet Take 2,000 mcg by mouth daily.   Yes Historical Provider, MD  fluticasone (FLONASE) 50 MCG/ACT nasal spray Place 2 sprays into the nose daily.   Yes Historical Provider, MD  gabapentin (NEURONTIN) 600 MG tablet TAKE ONE TABLET BY MOUTH THREE TIMES DAILY 01/03/16  Yes Stefanie Libel, MD  Misc Natural Products  (CALCIUM PLUS ADVANCED PO) Take 600 mg by mouth 2 (two) times daily.    Yes Historical Provider, MD  Multiple Vitamins-Minerals (CENTRUM SILVER PO) Take by mouth.   Yes Historical Provider, MD  Omega-3 Fatty Acids (FISH OIL) 1200 MG CAPS Take 1,200 mg by mouth 3 (three) times daily.    Yes Historical Provider, MD  pantoprazole (PROTONIX) 40 MG tablet Take 40 mg by mouth daily.    Yes Historical Provider, MD  rosuvastatin (CRESTOR) 10 MG tablet Take 1 tablet (10 mg total) by mouth daily. 01/10/15  Yes Nicole Bush V, PA-C  SYNTHROID 100 MCG tablet Take 100 mcg by mouth daily. 07/15/15  Yes Historical Provider, MD  vitamin E 400 UNIT capsule Take 400 Units by mouth daily.   Yes Historical Provider, MD  zoster vaccine live, PF, (ZOSTAVAX) 60454 UNT/0.65ML injection Inject 19,400 Units into the skin once. 12/26/15  Yes Darlyne Russian, MD    Allergies:  Allergies  Allergen Reactions   Meloxicam Other (See Comments)    Reaction unknown   Pravastatin Sodium Other (See Comments)    Reaction unknown   Simvastatin Other (See Comments)    Reaction unknown   Tramadol Hcl Other (See Comments)    Reaction unknown    Social History   Social History   Marital Status: Married    Spouse Name: N/A  Number of Children: N/A   Years of Education: N/A   Occupational History   Not on file.   Social History Main Topics   Smoking status: Never Smoker    Smokeless tobacco: Never Used   Alcohol Use: No   Drug Use: No   Sexual Activity:    Partners: Male   Other Topics Concern   Not on file   Social History Narrative   Married. Education: [employer].      Review of Systems: Constitutional: negative for fever, chills, night sweats, weight changes, or fatigue  HEENT: negative for vision changes, hearing loss, congestion, rhinorrhea, ST, epistaxis, or sinus pressure Cardiovascular: negative for chest pain or palpitations Respiratory: negative for hemoptysis, wheezing, shortness of  breath, or cough Abdominal: negative for abdominal pain, nausea, vomiting, diarrhea, or constipation Dermatological: negative for rash Neurologic: negative for headache, dizziness, or syncope Musc: positive for hip pain (right) All other systems reviewed and are otherwise negative with the exception to those above and in the HPI.  Physical Exam: Blood pressure 150/84, pulse 72, temperature 97.8 F (36.6 C), temperature source Oral, resp. rate 18, height 5\' 6"  (1.676 m), weight 244 lb (110.678 kg), SpO2 95 %., Body mass index is 39.4 kg/(m^2). General: Well developed, well nourished, in no acute distress; tearful Head: Normocephalic, atraumatic, eyes without discharge, sclera non-icteric, nares are without discharge. Bilateral auditory canals clear, TM's are without perforation, pearly grey and translucent with reflective cone of light bilaterally. Oral cavity moist, posterior pharynx without exudate, erythema, peritonsillar abscess, or post nasal drip.  Neck: Supple. No thyromegaly. Full ROM. No lymphadenopathy. Lungs: Clear bilaterally to auscultation without wheezes, rales, or rhonchi. Breathing is unlabored. Heart: RRR with S1 S2. No murmurs, rubs, or gallops appreciated. Msk:  Strength and tone normal for age. Extremities/Skin: Warm and dry. Right hip: tender over greater trochanter Neuro: Alert and oriented X 3. Moves all extremities spontaneously. Gait is normal. CNII-XII grossly in tact. Psych:  Responds to questions appropriately with a normal affect.   Labs:  ASSESSMENT AND PLAN:  60 y.o. year old female with right trochanteric bursitis, recurrent This chart was scribed in my presence and reviewed by me personally.    ICD-9-CM ICD-10-CM   1. Trochanteric bursitis of right hip 726.5 M70.61 predniSONE (DELTASONE) 20 MG tablet     HYDROcodone-acetaminophen (NORCO) 5-325 MG tablet      Signed, Robyn Haber, MD 01/04/2016 1:35 PM

## 2016-01-15 ENCOUNTER — Telehealth: Payer: Self-pay

## 2016-01-15 ENCOUNTER — Ambulatory Visit (INDEPENDENT_AMBULATORY_CARE_PROVIDER_SITE_OTHER): Payer: PPO | Admitting: Family Medicine

## 2016-01-15 VITALS — BP 126/74 | HR 73 | Temp 98.6°F | Ht 63.75 in | Wt 240.0 lb

## 2016-01-15 DIAGNOSIS — J209 Acute bronchitis, unspecified: Secondary | ICD-10-CM | POA: Diagnosis not present

## 2016-01-15 MED ORDER — HYDROCOD POLST-CPM POLST ER 10-8 MG/5ML PO SUER: 5.0000 mL | Freq: Two times a day (BID) | ORAL | Status: DC | PRN

## 2016-01-15 MED ORDER — AZITHROMYCIN 250 MG PO TABS: ORAL_TABLET | ORAL | Status: DC

## 2016-01-15 NOTE — Telephone Encounter (Signed)
Pt states she was seen today and given a cough medicine but her ins will not cover it,please advise    Best phone for pt is [phone]

## 2016-01-15 NOTE — Patient Instructions (Signed)

## 2016-01-15 NOTE — Progress Notes (Signed)
Subjective:  This chart was scribed for Robyn Haber MD, by Tamsen Roers, at [facility].  This patient was seen in room 11 and the patient's care was started at 1:02 PM.   Chief Complaint  Patient presents with  . Cough    x 2 days with yellow product  . Otalgia    both ears popping  . Nasal Congestion     Patient ID: Veronica Martinez, female    DOB: 01-21-1956, 60 y.o.   MRN: [id number]  HPI HPI Comments: Veronica Martinez is a 60 y.o. female who presents to the [facility] complaining of a productive cough (yellow phlegm)  with associated symptoms of ear pain and nasal congestion onset two days ago.  Patient has been taking over the counter medication but denies any relief. She is not able to sleep at night.  She states that many of her family members have had similar symptoms over the past few weeks.   Patients hip pain from her last visit has gotten much better and states that it is not bothering her any longer.   Patient does not work.      Patient Active Problem List   Diagnosis Date Noted  . 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 Right hip pain 08/09/2013  . Trigger finger, acquired 04/22/2013  . Carpal tunnel syndrome of right wrist 04/22/2013  . Pain of right heel 12/08/2012  . Myalgia 11/10/2012  . OBESITY, UNSPECIFIED 02/20/2011  . BACK PAIN, LUMBAR 07/05/2010  . ABNORMALITY OF GAIT 05/02/2010  . MERALGIA PARESTHETICA 03/02/2010  . HIP PAIN, BILATERAL 03/02/2010   Past Medical History  Diagnosis Date  . Allergy   . History of Graves' disease   . Hx of radioactive iodine thyroid ablation   . Hypothyroidism   . GERD (gastroesophageal reflux disease)   . High cholesterol   . Headache(784.0)     sinus   Past Surgical History  Procedure Laterality Date  . Shoulder surgery    . Appendectomy    . Joint replacement    . Abdominal hysterectomy    . Hip arthroplasty Bilateral   . Other surgical  history Right     carpal tunel injection  . Cataract extraction w/phaco Right 09/22/2013    Procedure: CATARACT EXTRACTION PHACO AND INTRAOCULAR LENS PLACEMENT (IOC);  Surgeon: [name], MD;  Location: [facility];  Service: Ophthalmology;  Laterality: Right;  . Eye surgery    . Tonsillectomy    . Colonoscopy    . Cataract extraction w/phaco Left 12/29/2013    Procedure: CATARACT EXTRACTION PHACO AND INTRAOCULAR LENS PLACEMENT (IOC);  Surgeon: [name], MD;  Location: [facility];  Service: Ophthalmology;  Laterality: Left;   Allergies  Allergen Reactions  . Meloxicam Other (See Comments)    Causes Bruising   . Pravastatin Sodium Other (See Comments)    Bruises  . Simvastatin Other (See Comments)    Bruises   . Tramadol Hcl Other (See Comments)    Bruising    Prior to Admission medications   Medication Sig Start Date End Date Taking? Authorizing Provider  Cholecalciferol (VITAMIN D) 2000 UNITS CAPS Take 2,000 Units by mouth daily.    Yes Historical Provider, MD  Coenzyme Q10 (CO Q 10 PO) Take 75 mg by mouth 3 (three) times daily.   Yes Historical Provider, MD  cyanocobalamin 2000 MCG tablet Take 2,000 mcg by mouth daily. Vitamin B12   Yes Historical  Provider, MD  fluticasone (FLONASE) 50 MCG/ACT nasal spray Place 2 sprays into the nose daily.   Yes Historical Provider, MD  gabapentin (NEURONTIN) 600 MG tablet TAKE ONE TABLET BY MOUTH THREE TIMES DAILY 01/03/16  Yes Stefanie Libel, MD  Misc Natural Products (CALCIUM PLUS ADVANCED PO) Take 600 mg by mouth 2 (two) times daily.    Yes Historical Provider, MD  Multiple Vitamins-Minerals (CENTRUM SILVER PO) Take by mouth.   Yes Historical Provider, MD  Omega-3 Fatty Acids (FISH OIL) 1200 MG CAPS Take 1,200 mg by mouth 3 (three) times daily.    Yes Historical Provider, MD  pantoprazole (PROTONIX) 40 MG tablet Take 40 mg by mouth daily.    Yes Historical Provider, MD  rosuvastatin (CRESTOR) 10 MG tablet Take 1 tablet (10 mg total) by mouth daily.  01/10/15  Yes Nicole Bush V, PA-C  SYNTHROID 100 MCG tablet Take 100 mcg by mouth daily. 07/15/15  Yes Historical Provider, MD  vitamin E 400 UNIT capsule Take 400 Units by mouth daily.   Yes Historical Provider, MD  zoster vaccine live, PF, (ZOSTAVAX) 60454 UNT/0.65ML injection Inject 19,400 Units into the skin once. 12/26/15  Yes Darlyne Russian, MD  HYDROcodone-acetaminophen (NORCO) 5-325 MG tablet Take 1 tablet by mouth every 6 (six) hours as needed for moderate pain. Patient not taking: Reported on 01/15/2016 01/04/16   Robyn Haber, MD  predniSONE (DELTASONE) 20 MG tablet Take 1 tablet (20 mg total) by mouth daily with breakfast. Take 3 x 2 days, then 2 x 2 days, then one daily with food Patient not taking: Reported on 01/15/2016 01/04/16   Robyn Haber, MD   Social History   Social History  . Marital Status: Married    Spouse Name: N/A  . Number of Children: N/A  . Years of Education: N/A   Occupational History  . Not on file.   Social History Main Topics  . Smoking status: Never Smoker   . Smokeless tobacco: Never Used  . Alcohol Use: No  . Drug Use: No  . Sexual Activity:    Partners: Male   Other Topics Concern  . Not on file   Social History Narrative   Married. Education: [employer].       Review of Systems  Constitutional: Negative for fever and chills.  HENT: Positive for congestion and ear pain.   Eyes: Negative for pain, redness and itching.  Respiratory: Positive for cough.   Gastrointestinal: Negative for nausea and vomiting.  Musculoskeletal: Negative for neck pain and neck stiffness.  Skin: Negative for color change.  Neurological: Negative for seizures, syncope and speech difficulty.       Objective:   Physical Exam  Constitutional: She is oriented to person, place, and time. She appears well-developed and well-nourished. No distress.  HENT:  Head: Normocephalic and atraumatic.  Eyes: Pupils are equal, round, and reactive to light.    Pulmonary/Chest: No respiratory distress.  Neurological: She is alert and oriented to person, place, and time.  Skin: Skin is warm and dry.  Psychiatric: She has a normal mood and affect. Her behavior is normal.    Filed Vitals:   01/15/16 1246  BP: 126/74  Pulse: 73  Temp: 98.6 F (37 C)  TempSrc: Oral  Height: 5' 3.75" (1.619 m)  Weight: 240 lb (108.863 kg)  SpO2: 93%         Assessment & Plan:   This chart was scribed in my presence and reviewed by me personally.  ICD-9-CM ICD-10-CM   1. Acute bronchitis, unspecified organism 466.0 J20.9 azithromycin (ZITHROMAX) 250 MG tablet     chlorpheniramine-HYDROcodone (TUSSIONEX PENNKINETIC ER) 10-8 MG/5ML SUER     Signed, Robyn Haber, MD

## 2016-01-17 NOTE — Telephone Encounter (Signed)
Left message for pt to call back  °

## 2016-01-22 ENCOUNTER — Ambulatory Visit: Payer: PPO

## 2016-01-22 ENCOUNTER — Encounter: Payer: Self-pay | Admitting: Podiatry

## 2016-01-22 ENCOUNTER — Ambulatory Visit (INDEPENDENT_AMBULATORY_CARE_PROVIDER_SITE_OTHER): Payer: PPO | Admitting: Podiatry

## 2016-01-22 VITALS — BP 151/86 | HR 78 | Resp 16 | Ht 65.0 in | Wt 240.0 lb

## 2016-01-22 DIAGNOSIS — L600 Ingrowing nail: Secondary | ICD-10-CM | POA: Diagnosis not present

## 2016-01-22 DIAGNOSIS — M79674 Pain in right toe(s): Secondary | ICD-10-CM

## 2016-01-22 DIAGNOSIS — B351 Tinea unguium: Secondary | ICD-10-CM

## 2016-01-22 NOTE — Progress Notes (Signed)
Subjective:     Patient ID: Veronica Martinez, female   DOB: [birth year], 60 y.o.   MRN: [id number]  HPI patient presents with painful nail left big toe medial border stating that it has been hard for her to wear shoe gear comfortably and been hurting at least a month. Also has thick yellow nails of the big toes bilateral and is concerned about this and would like to know what can be done to treat   Review of Systems  All other systems reviewed and are negative.      Objective:   Physical Exam  Constitutional: She is oriented to person, place, and time.  Cardiovascular: Intact distal pulses.   Musculoskeletal: Normal range of motion.  Neurological: She is oriented to person, place, and time.  Skin: Skin is warm.  Nursing note and vitals reviewed.  neurovascular status intact muscle strength adequate range of motion within normal limits with patient noted to have incurvated left hallux nail medial border that's very painful when pressed. Also found to have thickness of the nailbeds distal two thirds of the hallux nails bilateral and is noted to have good digital perfusion and is well oriented 3     Assessment:      ingrown toenail deformity left hallux medial border with distal redness but no drainage along with thick yellow nailbeds hallux bilateral    Plan:      H&P conditions reviewed with patient. I've recommended correction of the ingrown toenail and she wants this performed and I explained risk. Infiltrated 60 mg I can Marcaine mixture remove the medial border exposed matrix and applied phenol 3 applications 30 seconds followed by alcohol lavage and sterile dressing. Procedure to removal of distal portion of nailbed left big toe which I sent for pathological evaluation to decide whether or not oral antifungal or topical may be of benefit to her

## 2016-01-22 NOTE — Progress Notes (Signed)
   Subjective:    Patient ID: Ver[facility], female    DOB: 1956-11-26, 60 y.o.   MRN: [id number]  HPI Patient presents with a nail problem in their left foot; great toe-medial. Pt stated, "Hurts to touch nail and hurts to wear shoes"; x3-4 weeks.  Pt also presents with a bilateral nail problem; nail discoloration; thickened nails. Pt stated, "wants all nails checked for nail fungus".   Review of Systems  All other systems reviewed and are negative.      Objective:   Physical Exam        Assessment & Plan:

## 2016-01-22 NOTE — Addendum Note (Signed)
Addended by: Harriett Sine D on: 01/22/2016 03:29 PM   Modules accepted: Orders

## 2016-01-22 NOTE — Patient Instructions (Signed)

## 2016-01-25 ENCOUNTER — Telehealth: Payer: Self-pay | Admitting: *Deleted

## 2016-01-25 NOTE — Telephone Encounter (Signed)
Left message for patient at [phone] (Home #) to check to see how they were doing from their ingrown toenail that was performed on Monday, January 22, 2016. Waiting for a response.

## 2016-02-05 ENCOUNTER — Other Ambulatory Visit: Payer: Self-pay | Admitting: Sports Medicine

## 2016-02-05 ENCOUNTER — Other Ambulatory Visit: Payer: Self-pay | Admitting: *Deleted

## 2016-02-19 ENCOUNTER — Encounter: Payer: Self-pay | Admitting: Podiatry

## 2016-02-19 ENCOUNTER — Ambulatory Visit (INDEPENDENT_AMBULATORY_CARE_PROVIDER_SITE_OTHER): Payer: PPO | Admitting: Podiatry

## 2016-02-19 VITALS — BP 122/68 | HR 72 | Resp 16

## 2016-02-19 DIAGNOSIS — L600 Ingrowing nail: Secondary | ICD-10-CM | POA: Diagnosis not present

## 2016-02-19 DIAGNOSIS — B351 Tinea unguium: Secondary | ICD-10-CM | POA: Diagnosis not present

## 2016-02-19 MED ORDER — TERBINAFINE HCL 250 MG PO TABS: ORAL_TABLET | ORAL | Status: DC

## 2016-02-20 NOTE — Progress Notes (Signed)
Subjective:     Patient ID: Veronica [facility], female   DOB: [birth year], 60 y.o.   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

## 2016-02-28 ENCOUNTER — Ambulatory Visit (INDEPENDENT_AMBULATORY_CARE_PROVIDER_SITE_OTHER): Payer: PPO | Admitting: Sports Medicine

## 2016-02-28 ENCOUNTER — Encounter: Payer: Self-pay | Admitting: Sports Medicine

## 2016-02-28 VITALS — BP 132/68 | HR 68 | Ht 66.0 in | Wt 240.0 lb

## 2016-02-28 DIAGNOSIS — M25531 Pain in right wrist: Secondary | ICD-10-CM | POA: Diagnosis not present

## 2016-02-28 DIAGNOSIS — M25532 Pain in left wrist: Secondary | ICD-10-CM | POA: Diagnosis not present

## 2016-02-28 MED ORDER — NAPROXEN SODIUM 220 MG PO TABS: 220.0000 mg | ORAL_TABLET | Freq: Two times a day (BID) | ORAL | Status: DC | PRN

## 2016-02-28 MED ORDER — GABAPENTIN 600 MG PO TABS: 600.0000 mg | ORAL_TABLET | Freq: Three times a day (TID) | ORAL | Status: DC

## 2016-02-29 NOTE — Progress Notes (Signed)
ARSULA JAVINS - 60 y.o. female MRN [id number]  Date of birth: Sep 22, 1956  CC: b/l thumb pain  SUBJECTIVE:   HPI  Veronica [facility] is a relatively healthy 60 yo female here with her husband with b/l thumb pain.  This is a long term issue, 6+ months. 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  L>R.  Base of thumb.  Cold makes it worse.  She takes APAP, which helps quite a bit. No NSAIDs.  Previously did cleaning work as a Environmental consultant.   Her meralgia paresthetica has improved. Taking gabapentin daily.  Not doing her low back stretches.  Occurs 1x/month if she wears tight clothing.   ROS:     As above, denies fevers chills or night sweats. No joint swelling, new rash.    HISTORY: Past Medical, Surgical, Social, and Family History Reviewed & Updated per EMR.  Pertinent Historical Findings include: Hx of trigger finger and CTS, meralgia paresthetica  OBJECTIVE: BP 132/68 mmHg  Pulse 68  Ht 5\' 6"  (1.676 m)  Wt 240 lb (108.863 kg)  BMI 38.76 kg/m2  Physical Exam  Calm, NAD Non-labored breathing.   Distal b/l UE exam:  No erythema or warmth.  Slight nodularity over the plantar radial aspect of the base of the left thumb.   + grind L>R of CMC, MCP wnl.  Equivocal finkelstein's.   Slightly limited wrist motion. Wrist supination limited to 75 degees.  NV intact.   Ultrasound: Long and short axis views of the hands show advanced degenerative of the L>>R CMC. MCP wnl.  There is also a soft tissue mass adjacent to the distended joint capsule on the left that is palpable on exam.  Vascularity absent within the soft tissue mass.   MEDICATIONS, LABS & OTHER ORDERS: Previous Medications   CHLORPHENIRAMINE-HYDROCODONE (TUSSIONEX PENNKINETIC ER) 10-8 MG/5ML SUER    Take 5 mLs by mouth every 12 (twelve) hours as needed for cough.   CHOLECALCIFEROL (VITAMIN D) 2000 UNITS CAPS    Take 2,000 Units by mouth daily.    COENZYME Q10 (CO Q 10 PO)    Take 75 mg by mouth 3  (three) times daily.   CYANOCOBALAMIN 2000 MCG TABLET    Take 2,000 mcg by mouth daily. Vitamin B12   FLUTICASONE (FLONASE) 50 MCG/ACT NASAL SPRAY    Place 2 sprays into the nose daily.   MISC NATURAL PRODUCTS (CALCIUM PLUS ADVANCED PO)    Take 600 mg by mouth 2 (two) times daily.    MULTIPLE VITAMINS-MINERALS (CENTRUM SILVER PO)    Take by mouth.   OMEGA-3 FATTY ACIDS (FISH OIL) 1200 MG CAPS    Take 1,200 mg by mouth 3 (three) times daily.    PANTOPRAZOLE (PROTONIX) 40 MG TABLET    Take 40 mg by mouth daily.    ROSUVASTATIN (CRESTOR) 10 MG TABLET    Take 1 tablet (10 mg total) by mouth daily.   SYNTHROID 100 MCG TABLET    Take 100 mcg by mouth daily.   TERBINAFINE (LAMISIL) 250 MG TABLET    Take one tablet once daily x 7 days, then repeat every month x 4 months   VITAMIN E 400 UNIT CAPSULE    Take 400 Units by mouth daily.   ZOSTER VACCINE LIVE, PF, (ZOSTAVAX) 60454 UNT/0.65ML INJECTION    Inject 19,400 Units into the skin once.   Modified Medications   Modified Medication Previous Medication   GABAPENTIN (NEURONTIN) 600 MG TABLET gabapentin (NEURONTIN) 600  MG tablet      Take 1 tablet (600 mg total) by mouth 3 (three) times daily.    TAKE ONE TABLET BY MOUTH THREE TIMES DAILY   New Prescriptions   NAPROXEN SODIUM (ALEVE) 220 MG TABLET    Take 1 tablet (220 mg total) by mouth 2 (two) times daily as needed.   Discontinued Medications   No medications on file  No orders of the defined types were placed in this encounter.   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 Currently taking tylenol, which helps. Will try naproxen prn.  Offered injection.  Will f/u as needed for further eval and management. We also provided her with wrist splints to be used while working. Call with Questions.  Gerre Pebbles, MD

## 2016-03-13 ENCOUNTER — Telehealth: Payer: Self-pay | Admitting: *Deleted

## 2016-03-13 NOTE — Telephone Encounter (Signed)
Entered in error

## 2016-03-18 ENCOUNTER — Other Ambulatory Visit: Payer: Self-pay | Admitting: Family Medicine

## 2016-03-26 ENCOUNTER — Encounter: Payer: Self-pay | Admitting: Podiatry

## 2016-04-04 ENCOUNTER — Encounter: Payer: Self-pay | Admitting: Sports Medicine

## 2016-04-04 ENCOUNTER — Ambulatory Visit (INDEPENDENT_AMBULATORY_CARE_PROVIDER_SITE_OTHER): Payer: PPO | Admitting: Sports Medicine

## 2016-04-04 VITALS — BP 144/64 | Ht 66.0 in | Wt 240.0 lb

## 2016-04-04 DIAGNOSIS — M180 Bilateral primary osteoarthritis of first carpometacarpal joints: Secondary | ICD-10-CM

## 2016-04-04 DIAGNOSIS — M189 Osteoarthritis of first carpometacarpal joint, unspecified: Secondary | ICD-10-CM | POA: Insufficient documentation

## 2016-04-04 MED ORDER — METHYLPREDNISOLONE ACETATE 40 MG/ML IJ SUSP
20.0000 mg | Freq: Once | INTRAMUSCULAR | Status: AC
  Administered 2016-04-04: 20 mg via INTRA_ARTICULAR

## 2016-04-04 NOTE — Progress Notes (Signed)
  SHALLY KNICKREHM - 60 y.o. female MRN [id number]  Date of birth: November 23, 1956  CC: b/l thumb pain  SUBJECTIVE:   HPI  Krysta is a relatively healthy 60 yo here for thumb pain L>R.  This is a long term issue, 6+ months. 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   Base of thumb. Previously did cleaning work as a Environmental consultant.    ROS:     As above, denies fevers chills or night sweats. No joint swelling, new rash.    HISTORY: Past Medical, Surgical, Social, and Family History Reviewed & Updated per EMR.  Pertinent Historical Findings include: Hx of trigger finger and CTS, meralgia paresthetica  OBJECTIVE: BP 144/64 mmHg  Ht 5\' 6"  (1.676 m)  Wt 240 lb (108.863 kg)  BMI 38.76 kg/m2  Physical Exam  Calm, NAD Non-labored breathing.  + grind L>R of CMC with z deformity

## 2016-04-04 NOTE — Assessment & Plan Note (Signed)
  Aspiration/Injection Procedure Note Ver[facility] 16-Dec-1956  Procedure: Injection Indications: CMC OA L thumb  Procedure Details Consent: Risks of procedure as well as the alternatives and risks of each were explained to the (patient/caregiver).  Consent for procedure obtained. Time Out: Verified patient identification, verified procedure, site/side was marked, verified correct patient position, special equipment/implants available, medications/allergies/relevent history reviewed, required imaging and test results available.  Performed.  The area was cleaned with iodine and alcohol swabs.    The L CMC thumb was injected using 0.5 cc's of 40mg  Depomedrol and 0.5 cc's of 1% lidocaine with a 21 1 1/2" needle.  Ultrasound was used. Images were obtained in Transverse and Long views showing the injection.    A sterile dressing was applied.  Patient did tolerate procedure well. Estimated blood loss: None

## 2016-04-22 ENCOUNTER — Other Ambulatory Visit: Payer: Self-pay | Admitting: Physician Assistant

## 2016-04-24 NOTE — Telephone Encounter (Signed)
Pt is out of rx rosuvastatin (CRESTOR) 10 MG tablet [id number] pt has upcoming apt. On 06/27/16  Pharmacy:  [facility] [place], [place].   Please call [phone]

## 2016-04-29 ENCOUNTER — Ambulatory Visit (INDEPENDENT_AMBULATORY_CARE_PROVIDER_SITE_OTHER): Payer: PPO | Admitting: Physician Assistant

## 2016-04-29 VITALS — BP 116/74 | HR 90 | Temp 98.1°F | Resp 18 | Ht 63.5 in | Wt 244.0 lb

## 2016-04-29 DIAGNOSIS — Z79899 Other long term (current) drug therapy: Secondary | ICD-10-CM

## 2016-04-29 NOTE — Progress Notes (Signed)
Patient here due to a medication mix up at the pharmacy.  I spoke with the pharmacy who advised the medication had been written as "product selection not permitted" which generated a 900 dollar cost for three months medication.  I changed this to product selection permitted which brought the cost to 30 dollars for a three month supply. and advised the patient to go pick this up.    She has no other complaints today. Will not charge her for the visit as I did not evaluate her for a medical problem or change any medications.   Philis Fendt, MS, PA-C 7:18 PM, 04/29/2016

## 2016-04-29 NOTE — Patient Instructions (Signed)
     IF you received an x-ray today, you will receive an invoice from [facility]. Please contact [facility] at [phone] with questions or concerns regarding your invoice.   IF you received labwork today, you will receive an invoice from [facility]. Please contact [facility] at [phone] with questions or concerns regarding your invoice.   Our billing staff will not be able to assist you with questions regarding bills from these companies.  You will be contacted with the lab results as soon as they are available. The fastest way to get your results is to activate your My Chart account. Instructions are located on the last page of this paperwork. If you have not heard from us regarding the results in 2 weeks, please contact this office.      

## 2016-05-10 ENCOUNTER — Ambulatory Visit (INDEPENDENT_AMBULATORY_CARE_PROVIDER_SITE_OTHER): Payer: PPO | Admitting: Physician Assistant

## 2016-05-10 VITALS — BP 136/84 | HR 78 | Temp 98.3°F | Resp 18 | Ht 63.5 in | Wt 246.0 lb

## 2016-05-10 DIAGNOSIS — J302 Other seasonal allergic rhinitis: Secondary | ICD-10-CM | POA: Diagnosis not present

## 2016-05-10 DIAGNOSIS — H6692 Otitis media, unspecified, left ear: Secondary | ICD-10-CM

## 2016-05-10 MED ORDER — AMOXICILLIN 500 MG PO CAPS: 500.0000 mg | ORAL_CAPSULE | Freq: Two times a day (BID) | ORAL | Status: AC

## 2016-05-10 MED ORDER — GUAIFENESIN ER 1200 MG PO TB12: 1.0000 | ORAL_TABLET | Freq: Two times a day (BID) | ORAL | Status: DC | PRN

## 2016-05-10 MED ORDER — AMOXICILLIN 500 MG PO CAPS: 500.0000 mg | ORAL_CAPSULE | Freq: Three times a day (TID) | ORAL | Status: DC

## 2016-05-10 NOTE — Patient Instructions (Addendum)
     IF you received an x-ray today, you will receive an invoice from [facility]. Please contact [facility] at [phone] with questions or concerns regarding your invoice.   IF you received labwork today, you will receive an invoice from [facility]. Please contact [facility] at [phone] with questions or concerns regarding your invoice.   Our billing staff will not be able to assist you with questions regarding bills from these companies.  You will be contacted with the lab results as soon as they are available. The fastest way to get your results is to activate your My Chart account. Instructions are located on the last page of this paperwork. If you have not heard from [place] regarding the results in 2 weeks, please contact this office.    Please take the claritin every day, at least until July.  Use the flonase, until the congestion resolves.   Otitis Media, Adult Otitis media is redness, soreness, and inflammation of the middle ear. Otitis media may be caused by allergies or, most commonly, by infection. Often it occurs as a complication of the common cold. SIGNS AND SYMPTOMS Symptoms of otitis media may include:  Earache.  Fever.  Ringing in your ear.  Headache.  Leakage of fluid from the ear. DIAGNOSIS To diagnose otitis media, your health care provider will examine your ear with an otoscope. This is an instrument that allows your health care provider to see into your ear in order to examine your eardrum. Your health care provider also will ask you questions about your symptoms. TREATMENT  Typically, otitis media resolves on its own within 3-5 days. Your health care provider may prescribe medicine to ease your symptoms of pain. If otitis media does not resolve within 5 days or is recurrent, your health care provider may prescribe antibiotic medicines if he or she suspects that a bacterial infection is the cause. HOME CARE  INSTRUCTIONS   If you were prescribed an antibiotic medicine, finish it all even if you start to feel better.  Take medicines only as directed by your health care provider.  Keep all follow-up visits as directed by your health care provider. SEEK MEDICAL CARE IF:  You have otitis media only in one ear, or bleeding from your nose, or both.  You notice a lump on your neck.  You are not getting better in 3-5 days.  You feel worse instead of better. SEEK IMMEDIATE MEDICAL CARE IF:   You have pain that is not controlled with medicine.  You have swelling, redness, or pain around your ear or stiffness in your neck.  You notice that part of your face is paralyzed.  You notice that the bone behind your ear (mastoid) is tender when you touch it. MAKE SURE YOU:   Understand these instructions.  Will watch your condition.  Will get help right away if you are not doing well or get worse.   This information is not intended to replace advice given to you by your health care provider. Make sure you discuss any questions you have with your health care provider.   Document Released: 09/13/2004 Document Revised: 12/30/2014 Document Reviewed: 07/06/2013 Elsevier Interactive Patient Education [employer].

## 2016-05-10 NOTE — Progress Notes (Signed)
[facility] [address] [place] [address] [phone]  Date:  05/10/2016   Name:  Veronica [facility]   DOB:  [birth year]   MRN:  [id number]  PCP:  Jenny Reichmann, MD   Chief Complaint  Patient presents with  . Ear Pain    LEFT SINCE LAST NIGHT     History of Present Illness:  Veronica [facility] is a 60 y.o. female patient who presents to [facility] for left ear pain. Pain was abrupt last night around 3 AM. It is throbbing. She has had no drainage or fever. She has been having nasal congestion for the last month. She has a mild nonproductive cough. She denies any shortness of breath or dyspnea. She has allergies which she takes Claritin and Flonase, however this is been very infrequent. She only takes that symptomatically if that. She also has sneezing and watery eyes. She is hydrating well.      Patient Active Problem List   Diagnosis Date Noted  . 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 Right hip pain 08/09/2013  . Trigger finger, acquired 04/22/2013  . Carpal tunnel syndrome of right wrist 04/22/2013  . Pain of right heel 12/08/2012  . Myalgia 11/10/2012  . OBESITY, UNSPECIFIED 02/20/2011  . BACK PAIN, LUMBAR 07/05/2010  . ABNORMALITY OF GAIT 05/02/2010  . MERALGIA PARESTHETICA 03/02/2010  . HIP PAIN, BILATERAL 03/02/2010    Past Medical History  Diagnosis Date  . Allergy   . History of Graves' disease   . Hx of radioactive iodine thyroid ablation   . Hypothyroidism   . GERD (gastroesophageal reflux disease)   . High cholesterol   . Headache(784.0)     sinus    Past Surgical History  Procedure Laterality Date  . Shoulder surgery    . Appendectomy    . Joint replacement    . Abdominal hysterectomy    . Hip arthroplasty Bilateral   . Other surgical history Right     carpal tunel injection  . Cataract extraction w/phaco Right 09/22/2013    Procedure: CATARACT EXTRACTION PHACO AND  INTRAOCULAR LENS PLACEMENT (IOC);  Surgeon: [name], MD;  Location: [facility];  Service: Ophthalmology;  Laterality: Right;  . Eye surgery    . Tonsillectomy    . Colonoscopy    . Cataract extraction w/phaco Left 12/29/2013    Procedure: CATARACT EXTRACTION PHACO AND INTRAOCULAR LENS PLACEMENT (IOC);  Surgeon: [name], MD;  Location: [facility];  Service: Ophthalmology;  Laterality: Left;    Social History  Substance Use Topics  . Smoking status: Never Smoker   . Smokeless tobacco: Never Used  . Alcohol Use: No    History reviewed. No pertinent family history.  Allergies  Allergen Reactions  . Meloxicam Other (See Comments)    Causes Bruising   . Pravastatin Sodium Other (See Comments)    Bruises  . Simvastatin Other (See Comments)    Bruises   . Tramadol Hcl Other (See Comments)    Bruising     Medication list has been reviewed and updated.  Current Outpatient Prescriptions on File Prior to Visit  Medication Sig Dispense Refill  . Cholecalciferol (VITAMIN D) 2000 UNITS CAPS Take 2,000 Units by mouth daily.     . Coenzyme Q10 (CO Q 10 PO) Take 75 mg by mouth 3 (three) times daily.    . CRESTOR 10 MG tablet TAKE ONE TABLET BY MOUTH  ONCE DAILY 90 tablet 0  . cyanocobalamin 2000 MCG tablet Take 2,000 mcg by mouth daily. Vitamin B12    . fluticasone (FLONASE) 50 MCG/ACT nasal spray TAKE 2 PUFFS EACH NOSTRIL EVERY DAY 48 g 2  . gabapentin (NEURONTIN) 600 MG tablet Take 1 tablet (600 mg total) by mouth 3 (three) times daily. 90 tablet 6  . ibuprofen (ADVIL,MOTRIN) 600 MG tablet TAKE 1 TABLET BY MOUTH EVERY 6 TO 8 HOURS AS NEEDED FOR PAIN  2  . Misc Natural Products (CALCIUM PLUS ADVANCED PO) Take 600 mg by mouth 2 (two) times daily.     . Multiple Vitamins-Minerals (CENTRUM SILVER PO) Take by mouth.    . naproxen sodium (ALEVE) 220 MG tablet Take 1 tablet (220 mg total) by mouth 2 (two) times daily as needed. 60 tablet 2  . Omega-3 Fatty Acids (FISH OIL) 1200 MG CAPS Take 1,200  mg by mouth 3 (three) times daily.     . pantoprazole (PROTONIX) 40 MG tablet Take 40 mg by mouth daily.     [name] SYNTHROID 100 MCG tablet Take 100 mcg by mouth daily.  4  . terbinafine (LAMISIL) 250 MG tablet Take one tablet once daily x 7 days, then repeat every month x 4 months 28 tablet 0  . vitamin E 400 UNIT capsule Take 400 Units by mouth daily.    [name] zoster vaccine live, PF, (ZOSTAVAX) 09811 UNT/0.65ML injection Inject 19,400 Units into the skin once. 1 each 0  . chlorpheniramine-HYDROcodone (TUSSIONEX PENNKINETIC ER) 10-8 MG/5ML SUER Take 5 mLs by mouth every 12 (twelve) hours as needed for cough. (Patient not taking: Reported on 05/10/2016) 100 mL 0   No current facility-administered medications on file prior to visit.    ROS ROS otherwise unremarkable unless listed above.   Physical Examination: BP 136/84 mmHg  Pulse 78  Temp(Src) 98.3 F (36.8 C) (Oral)  Resp 18  Ht 5' 3.5" (1.613 m)  Wt 246 lb (111.585 kg)  BMI 42.89 kg/m2  SpO2 95% Ideal Body Weight: Weight in (lb) to have BMI = 25: 143.1  Physical Exam  Constitutional: She is oriented to person, place, and time. She appears well-developed and well-nourished. No distress.  HENT:  Head: Normocephalic and atraumatic.  Right Ear: Tympanic membrane, external ear and ear canal normal.  Left Ear: External ear and ear canal normal. No mastoid tenderness. Tympanic membrane is erythematous. Tympanic membrane is not perforated and not bulging.  No middle ear effusion.  Nose: Mucosal edema and rhinorrhea present. Right sinus exhibits no maxillary sinus tenderness and no frontal sinus tenderness. Left sinus exhibits no maxillary sinus tenderness and no frontal sinus tenderness.  Mouth/Throat: No uvula swelling. No oropharyngeal exudate, posterior oropharyngeal edema or posterior oropharyngeal erythema.  Eyes: Conjunctivae and EOM are normal. Pupils are equal, round, and reactive to light.  Cardiovascular: Normal rate and regular  rhythm.  Exam reveals no gallop, no distant heart sounds and no friction rub.   No murmur heard. Pulmonary/Chest: Effort normal. No respiratory distress. She has no decreased breath sounds. She has no wheezes. She has no rhonchi.  Lymphadenopathy:       Head (right side): No submandibular, no tonsillar, no preauricular and no posterior auricular adenopathy present.       Head (left side): No submandibular, no tonsillar, no preauricular and no posterior auricular adenopathy present.  Neurological: She is alert and oriented to person, place, and time.  Skin: She is not diaphoretic.  Psychiatric: She has a normal  mood and affect. Her behavior is normal.    Assessment and Plan: CATHERYN YOUNIS is a 60 y.o. female who is here today for cc of left ear pain. -treating at this time.  I have advised to consistently take the claritin, and flonase at this time as well. Subacute otitis media of left ear, recurrence not specified, unspecified otitis media type - Plan: amoxicillin (AMOXIL) 500 MG capsule, Guaifenesin (MUCINEX MAXIMUM STRENGTH) 1200 MG TB12, DISCONTINUED: amoxicillin (AMOXIL) 500 MG capsule  Seasonal allergies  Ivar Drape, PA-C [facility] 05/10/2016 8:18 AM

## 2016-06-01 ENCOUNTER — Ambulatory Visit (INDEPENDENT_AMBULATORY_CARE_PROVIDER_SITE_OTHER): Payer: PPO | Admitting: Family Medicine

## 2016-06-01 VITALS — BP 138/70 | HR 78 | Temp 97.9°F | Resp 18 | Ht 63.5 in | Wt 247.2 lb

## 2016-06-01 DIAGNOSIS — J019 Acute sinusitis, unspecified: Secondary | ICD-10-CM

## 2016-06-01 MED ORDER — PREDNISONE 20 MG PO TABS: ORAL_TABLET | ORAL | Status: DC

## 2016-06-01 MED ORDER — CLARITHROMYCIN ER 500 MG PO TB24: 1000.0000 mg | ORAL_TABLET | Freq: Every day | ORAL | Status: DC

## 2016-06-01 NOTE — Progress Notes (Signed)
By signing my name below I, Tereasa Coop, attest that this documentation has been prepared under the direction and in the presence of Delman Cheadle, MD. Electonically Signed. Tereasa Coop, Scribe 06/01/2016 at 2:00 PM  Subjective:    Patient ID: Veronica Martinez, female    DOB: [birth year], 60 y.o.   MRN: [id number]  Chief Complaint  Patient presents with  . Headache    & drainage-was treated for an ear infection about a month ago. Current sx's have been persistent since last visit    HPI Veronica Martinez is a 60 y.o. female who presents to the [facility] complaining of HA and sinus drainage for the past month. Pt seen and evaluated at [facility] 3 weeks ago and dx with left otitis media, treated with amoxicillin BID for 10 days. Pt's HA and sinus drainage were unchanged with treatment. Pt's ear pain resolved with treatment.  Pt denies any other antibiotic treatment within the past few months.  Pt still c/o central sinus pressure. Pt denies fever, chills, sore throat. Pt has a mild dry cough. Pt reports taking a "migraine pill" with no relief of HA.   Pt reports having taken prednisone to resolve sinus congestion. Pt denies any side effects of prednisone.   Pt denies any antibiotic allergies.   Patient Active Problem List   Diagnosis Date Noted  . 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 Right hip pain 08/09/2013  . Trigger finger, acquired 04/22/2013  . Carpal tunnel syndrome of right wrist 04/22/2013  . Pain of right heel 12/08/2012  . Myalgia 11/10/2012  . OBESITY, UNSPECIFIED 02/20/2011  . BACK PAIN, LUMBAR 07/05/2010  . ABNORMALITY OF GAIT 05/02/2010  . MERALGIA PARESTHETICA 03/02/2010  . HIP PAIN, BILATERAL 03/02/2010    Current Outpatient Prescriptions on File Prior to Visit  Medication Sig Dispense Refill  . Cholecalciferol (VITAMIN D) 2000 UNITS CAPS Take 2,000 Units by mouth daily.     . Coenzyme  Q10 (CO Q 10 PO) Take 75 mg by mouth 3 (three) times daily.    . CRESTOR 10 MG tablet TAKE ONE TABLET BY MOUTH ONCE DAILY 90 tablet 0  . cyanocobalamin 2000 MCG tablet Take 2,000 mcg by mouth daily. Vitamin B12    . fluticasone (FLONASE) 50 MCG/ACT nasal spray TAKE 2 PUFFS EACH NOSTRIL EVERY DAY 48 g 2  . gabapentin (NEURONTIN) 600 MG tablet Take 1 tablet (600 mg total) by mouth 3 (three) times daily. 90 tablet 6  . Guaifenesin (MUCINEX MAXIMUM STRENGTH) 1200 MG TB12 Take 1 tablet (1,200 mg total) by mouth every 12 (twelve) hours as needed. 14 tablet 1  . ibuprofen (ADVIL,MOTRIN) 600 MG tablet TAKE 1 TABLET BY MOUTH EVERY 6 TO 8 HOURS AS NEEDED FOR PAIN  2  . Misc Natural Products (CALCIUM PLUS ADVANCED PO) Take 600 mg by mouth 2 (two) times daily.     . Multiple Vitamins-Minerals (CENTRUM SILVER PO) Take by mouth.    . naproxen sodium (ALEVE) 220 MG tablet Take 1 tablet (220 mg total) by mouth 2 (two) times daily as needed. 60 tablet 2  . Omega-3 Fatty Acids (FISH OIL) 1200 MG CAPS Take 1,200 mg by mouth 3 (three) times daily.     . pantoprazole (PROTONIX) 40 MG tablet Take 40 mg by mouth daily.     [name] SYNTHROID 100 MCG tablet Take 100 mcg by mouth daily.  4  . terbinafine (LAMISIL)  250 MG tablet Take one tablet once daily x 7 days, then repeat every month x 4 months 28 tablet 0  . vitamin E 400 UNIT capsule Take 400 Units by mouth daily.    [name] zoster vaccine live, PF, (ZOSTAVAX) 16109 UNT/0.65ML injection Inject 19,400 Units into the skin once. 1 each 0  . chlorpheniramine-HYDROcodone (TUSSIONEX PENNKINETIC ER) 10-8 MG/5ML SUER Take 5 mLs by mouth every 12 (twelve) hours as needed for cough. (Patient not taking: Reported on 05/10/2016) 100 mL 0   No current facility-administered medications on file prior to visit.    Allergies  Allergen Reactions  . Meloxicam Other (See Comments)    Causes Bruising   . Pravastatin Sodium Other (See Comments)    Bruises  . Simvastatin Other (See Comments)      Bruises   . Tramadol Hcl Other (See Comments)    Bruising     Depression screen [facility] 2/9 06/01/2016 05/10/2016 04/29/2016 04/04/2016 04/04/2016  Decreased Interest 0 0 0 0 0  Down, Depressed, Hopeless 0 0 0 0 0  PHQ - 2 Score 0 0 0 0 0        Review of Systems  Constitutional: Negative for fever.  HENT: Positive for congestion. Negative for ear pain and sore throat.   Eyes: Negative for visual disturbance.  Respiratory: Positive for cough (mild dry).   Cardiovascular: Negative for chest pain.  Gastrointestinal: Negative for abdominal pain.  Genitourinary: Negative for dysuria.  Musculoskeletal: Negative for back pain.  Skin: Negative for rash.  Neurological: Positive for headaches.  Psychiatric/Behavioral: Negative for behavioral problems.       Objective:  BP 138/70 mmHg  Pulse 78  Temp(Src) 97.9 F (36.6 C) (Oral)  Resp 18  Ht 5' 3.5" (1.613 m)  Wt 247 lb 4 oz (112.152 kg)  BMI 43.11 kg/m2  SpO2 95%  Physical Exam  Constitutional: She is oriented to person, place, and time. She appears well-developed and well-nourished. No distress.  HENT:  Head: Normocephalic and atraumatic.  Right Ear: Hearing, tympanic membrane, external ear and ear canal normal.  Left Ear: Hearing, tympanic membrane, external ear and ear canal normal.  Pt has pale boggy mucus in the nares bilat.   Eyes: Conjunctivae are normal. Pupils are equal, round, and reactive to light.  Neck: Neck supple.  Cardiovascular: Normal rate, regular rhythm, S1 normal, S2 normal and normal heart sounds.  Exam reveals no gallop and no friction rub.   No murmur heard. Pulmonary/Chest: Effort normal and breath sounds normal. No respiratory distress. She has no decreased breath sounds. She has no wheezes. She has no rhonchi. She has no rales.  Musculoskeletal: Normal range of motion.  Neurological: She is alert and oriented to person, place, and time.  Skin: Skin is warm and dry.  Psychiatric: She has a normal  mood and affect. Her behavior is normal.  Nursing note and vitals reviewed.         Assessment & Plan:   Sinusitis  Meds ordered this encounter  Medications  . DISCONTD: predniSONE (DELTASONE) 20 MG tablet    Sig: Take 3 tabs qd x 2d, then 2 tabs qd x 2d then 1 tab qd x 2d.    Dispense:  12 tablet    Refill:  0  . DISCONTD: clarithromycin (BIAXIN XL) 500 MG 24 hr tablet    Sig: Take 2 tablets (1,000 mg total) by mouth daily.    Dispense:  20 tablet    Refill:  0  I personally performed the services described in this documentation, which was scribed in my presence. The recorded information has been reviewed and considered, and addended by me as needed.   Delman Cheadle, M.D.  [facility] [address] [place], [place] [address] [phone] phone [phone] fax  06/21/2016 7:22 AM

## 2016-06-01 NOTE — Patient Instructions (Addendum)
   IF you received an x-ray today, you will receive an invoice from [facility]. Please contact [facility] at [phone] with questions or concerns regarding your invoice.   IF you received labwork today, you will receive an invoice from [facility]. Please contact [facility] at [phone] with questions or concerns regarding your invoice.   Our billing staff will not be able to assist you with questions regarding bills from these companies.  You will be contacted with the lab results as soon as they are available. The fastest way to get your results is to activate your My Chart account. Instructions are located on the last page of this paperwork. If you have not heard from us regarding the results in 2 weeks, please contact this office.     Sinusitis, Adult Sinusitis is redness, soreness, and inflammation of the paranasal sinuses. Paranasal sinuses are air pockets within the bones of your face. They are located beneath your eyes, in the middle of your forehead, and above your eyes. In healthy paranasal sinuses, mucus is able to drain out, and air is able to circulate through them by way of your nose. However, when your paranasal sinuses are inflamed, mucus and air can become trapped. This can allow bacteria and other germs to grow and cause infection. Sinusitis can develop quickly and last only a short time (acute) or continue over a long period (chronic). Sinusitis that lasts for more than 12 weeks is considered chronic. CAUSES Causes of sinusitis include:  Allergies.  Structural abnormalities, such as displacement of the cartilage that separates your nostrils (deviated septum), which can decrease the air flow through your nose and sinuses and affect sinus drainage.  Functional abnormalities, such as when the small hairs (cilia) that line your sinuses and help remove mucus do not work properly or are not present. SIGNS AND SYMPTOMS Symptoms  of acute and chronic sinusitis are the same. The primary symptoms are pain and pressure around the affected sinuses. Other symptoms include:  Upper toothache.  Earache.  Headache.  Bad breath.  Decreased sense of smell and taste.  A cough, which worsens when you are lying flat.  Fatigue.  Fever.  Thick drainage from your nose, which often is green and may contain pus (purulent).  Swelling and warmth over the affected sinuses. DIAGNOSIS Your health care provider will perform a physical exam. During your exam, your health care provider may perform any of the following to help determine if you have acute sinusitis or chronic sinusitis:  Look in your nose for signs of abnormal growths in your nostrils (nasal polyps).  Tap over the affected sinus to check for signs of infection.  View the inside of your sinuses using an imaging device that has a light attached (endoscope). If your health care provider suspects that you have chronic sinusitis, one or more of the following tests may be recommended:  Allergy tests.  Nasal culture. A sample of mucus is taken from your nose, sent to a lab, and screened for bacteria.  Nasal cytology. A sample of mucus is taken from your nose and examined by your health care provider to determine if your sinusitis is related to an allergy. TREATMENT Most cases of acute sinusitis are related to a viral infection and will resolve on their own within 10 days. Sometimes, medicines are prescribed to help relieve symptoms of both acute and chronic sinusitis. These may include pain medicines, decongestants, nasal steroid sprays, or saline sprays. However, for sinusitis related   to a bacterial infection, your health care provider will prescribe antibiotic medicines. These are medicines that will help kill the bacteria causing the infection. Rarely, sinusitis is caused by a fungal infection. In these cases, your health care provider will prescribe antifungal  medicine. For some cases of chronic sinusitis, surgery is needed. Generally, these are cases in which sinusitis recurs more than 3 times per year, despite other treatments. HOME CARE INSTRUCTIONS  Drink plenty of water. Water helps thin the mucus so your sinuses can drain more easily.  Use a humidifier.  Inhale steam 3-4 times a day (for example, sit in the bathroom with the shower running).  Apply a warm, moist washcloth to your face 3-4 times a day, or as directed by your health care provider.  Use saline nasal sprays to help moisten and clean your sinuses.  Take medicines only as directed by your health care provider.  If you were prescribed either an antibiotic or antifungal medicine, finish it all even if you start to feel better. SEEK IMMEDIATE MEDICAL CARE IF:  You have increasing pain or severe headaches.  You have nausea, vomiting, or drowsiness.  You have swelling around your face.  You have vision problems.  You have a stiff neck.  You have difficulty breathing.   This information is not intended to replace advice given to you by your health care provider. Make sure you discuss any questions you have with your health care provider.   Document Released: 12/09/2005 Document Revised: 12/30/2014 Document Reviewed: 12/24/2011 Elsevier Interactive Patient Education 2016 [employer]  

## 2016-06-04 ENCOUNTER — Ambulatory Visit (INDEPENDENT_AMBULATORY_CARE_PROVIDER_SITE_OTHER): Payer: PPO | Admitting: Family Medicine

## 2016-06-04 VITALS — BP 124/75 | HR 106 | Temp 97.8°F | Resp 16 | Ht 63.5 in | Wt 249.8 lb

## 2016-06-04 DIAGNOSIS — R12 Heartburn: Secondary | ICD-10-CM

## 2016-06-04 DIAGNOSIS — R079 Chest pain, unspecified: Secondary | ICD-10-CM

## 2016-06-04 DIAGNOSIS — J0100 Acute maxillary sinusitis, unspecified: Secondary | ICD-10-CM | POA: Diagnosis not present

## 2016-06-04 MED ORDER — AMOXICILLIN-POT CLAVULANATE 875-125 MG PO TABS: 1.0000 | ORAL_TABLET | Freq: Two times a day (BID) | ORAL | Status: DC

## 2016-06-04 NOTE — Progress Notes (Signed)
By signing my name below I, Tereasa Coop, attest that this documentation has been prepared under the direction and in the presence of Wendie Agreste, MD. Electonically Signed. Tereasa Coop, Scribe 06/04/2016 at 5:59 PM  Subjective:    Patient ID: Veronica Martinez, female    DOB: Jul 21, 1956, 60 y.o.   MRN: [id number]  Chief Complaint  Patient presents with  . Follow-up    acid reflux after medication she was given from Saturday    HPI Veronica Martinez is a 60 y.o. female who presents to the [facility] complaining of central CP that is similar to prior episodes of GERD that started after pt started taking clarithromycin and prednisone 3 days ago after her last visit to [facility] where she was c/o continued sinus pressure. Pt denies any history of CAD. Pt states she discontinued prednisone 2 days ago and states she took her last clarithromycin today and does not want to continue the medication due to the reflux side-effects. Pt also reports nausea. Pt denies any radiation of pain. CP improved by drinking water. CP waxes and wanes. Pt also c/o metallic taste. Pt denies any diaphoresis, or V/D, or SOB.   Pt still has sinus HA that is unchanged with medications. Pt is also using flonase.   Pt takes 40 mg protonix QD for GERD.  Pt seen 3 days ago at [facility] and started on clarithromycin and prednisone for suspected sinusitis.     Patient Active Problem List   Diagnosis Date Noted  . 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 Right hip pain 08/09/2013  . Trigger finger, acquired 04/22/2013  . Carpal tunnel syndrome of right wrist 04/22/2013  . Pain of right heel 12/08/2012  . Myalgia 11/10/2012  . OBESITY, UNSPECIFIED 02/20/2011  . BACK PAIN, LUMBAR 07/05/2010  . ABNORMALITY OF GAIT 05/02/2010  . MERALGIA PARESTHETICA 03/02/2010  . HIP PAIN, BILATERAL 03/02/2010   Past Medical History  Diagnosis Date  . Allergy     . History of Graves' disease   . Hx of radioactive iodine thyroid ablation   . Hypothyroidism   . GERD (gastroesophageal reflux disease)   . High cholesterol   . Headache(784.0)     sinus   Past Surgical History  Procedure Laterality Date  . Shoulder surgery    . Appendectomy    . Joint replacement    . Abdominal hysterectomy    . Hip arthroplasty Bilateral   . Other surgical history Right     carpal tunel injection  . Cataract extraction w/phaco Right 09/22/2013    Procedure: CATARACT EXTRACTION PHACO AND INTRAOCULAR LENS PLACEMENT (IOC);  Surgeon: [name], MD;  Location: [place];  Service: Ophthalmology;  Laterality: Right;  . Eye surgery    . Tonsillectomy    . Colonoscopy    . Cataract extraction w/phaco Left 12/29/2013    Procedure: CATARACT EXTRACTION PHACO AND INTRAOCULAR LENS PLACEMENT (IOC);  Surgeon: [name], MD;  Location: [place];  Service: Ophthalmology;  Laterality: Left;   Allergies  Allergen Reactions  . Meloxicam Other (See Comments)    Causes Bruising   . Pravastatin Sodium Other (See Comments)    Bruises  . Simvastatin Other (See Comments)    Bruises   . Tramadol Hcl Other (See Comments)    Bruising    Prior to Admission medications   Medication Sig Start Date End Date Taking? Authorizing Provider  Cholecalciferol (VITAMIN  D) 2000 UNITS CAPS Take 2,000 Units by mouth daily.    Yes Historical Provider, MD  clarithromycin (BIAXIN XL) 500 MG 24 hr tablet Take 2 tablets (1,000 mg total) by mouth daily. 06/01/16  Yes Shawnee Knapp, MD  Coenzyme Q10 (CO Q 10 PO) Take 75 mg by mouth 3 (three) times daily.   Yes Historical Provider, MD  CRESTOR 10 MG tablet TAKE ONE TABLET BY MOUTH ONCE DAILY 04/25/16  Yes Ezekiel Slocumb, PA-C  cyanocobalamin 2000 MCG tablet Take 2,000 mcg by mouth daily. Vitamin B12   Yes Historical Provider, MD  fluticasone (FLONASE) 50 MCG/ACT nasal spray TAKE 2 PUFFS EACH NOSTRIL EVERY DAY 03/19/16  Yes Robyn Haber, MD  gabapentin  (NEURONTIN) 600 MG tablet Take 1 tablet (600 mg total) by mouth 3 (three) times daily. 02/28/16  Yes Stefanie Libel, MD  Guaifenesin [facility] MAXIMUM STRENGTH) 1200 MG TB12 Take 1 tablet (1,200 mg total) by mouth every 12 (twelve) hours as needed. 05/10/16  Yes Stephanie D English, PA  ibuprofen (ADVIL,MOTRIN) 600 MG tablet TAKE 1 TABLET BY MOUTH EVERY 6 TO 8 HOURS AS NEEDED FOR PAIN 03/25/16  Yes Historical Provider, MD  Misc Natural Products (CALCIUM PLUS ADVANCED PO) Take 600 mg by mouth 2 (two) times daily.    Yes Historical Provider, MD  Multiple Vitamins-Minerals (CENTRUM SILVER PO) Take by mouth.   Yes Historical Provider, MD  naproxen sodium (ALEVE) 220 MG tablet Take 1 tablet (220 mg total) by mouth 2 (two) times daily as needed. 02/28/16  Yes Stefanie Libel, MD  Omega-3 Fatty Acids (FISH OIL) 1200 MG CAPS Take 1,200 mg by mouth 3 (three) times daily.    Yes Historical Provider, MD  pantoprazole (PROTONIX) 40 MG tablet Take 40 mg by mouth daily.    Yes Historical Provider, MD  predniSONE (DELTASONE) 20 MG tablet Take 3 tabs qd x 2d, then 2 tabs qd x 2d then 1 tab qd x 2d. 06/01/16  Yes Shawnee Knapp, MD  SYNTHROID 100 MCG tablet Take 100 mcg by mouth daily. 07/15/15  Yes Historical Provider, MD  vitamin E 400 UNIT capsule Take 400 Units by mouth daily.   Yes Historical Provider, MD  zoster vaccine live, PF, (ZOSTAVAX) 16109 UNT/0.65ML injection Inject 19,400 Units into the skin once. 12/26/15  Yes Darlyne Russian, MD  terbinafine (LAMISIL) 250 MG tablet Take one tablet once daily x 7 days, then repeat every month x 4 months Patient not taking: Reported on 06/04/2016 02/19/16   Wallene Huh, DPM   Social History   Social History  . Marital Status: Married    Spouse Name: N/A  . Number of Children: N/A  . Years of Education: N/A   Occupational History  . Not on file.   Social History Main Topics  . Smoking status: Never Smoker   . Smokeless tobacco: Never Used  . Alcohol Use: No  . Drug Use: No  .  Sexual Activity:    Partners: Male   Other Topics Concern  . Not on file   Social History Narrative   Married. Education: [employer].       Review of Systems  Constitutional: Negative for diaphoresis, fatigue and unexpected weight change.  HENT: Positive for sinus pressure.   Respiratory: Negative for chest tightness and shortness of breath.   Cardiovascular: Positive for chest pain. Negative for palpitations and leg swelling.  Gastrointestinal: Positive for nausea. Negative for abdominal pain and blood in stool.  Neurological: Negative  for dizziness, syncope, light-headedness and headaches.       Objective:   Physical Exam  Constitutional: She is oriented to person, place, and time. She appears well-developed and well-nourished.  HENT:  Head: Normocephalic and atraumatic.  Nose: Right sinus exhibits maxillary sinus tenderness. Left sinus exhibits maxillary sinus tenderness.  Eyes: Conjunctivae and EOM are normal. Pupils are equal, round, and reactive to light.  Neck: Carotid bruit is not present.  Cardiovascular: Normal rate, regular rhythm, normal heart sounds and intact distal pulses.  Exam reveals no gallop and no friction rub.   No murmur heard. Pulmonary/Chest: Effort normal and breath sounds normal. No accessory muscle usage. She has no decreased breath sounds. She has no wheezes. She has no rhonchi. She has no rales.  Abdominal: Soft. Bowel sounds are normal. She exhibits no pulsatile midline mass. There is no tenderness.  Neurological: She is alert and oriented to person, place, and time.  Skin: Skin is warm and dry.  Psychiatric: She has a normal mood and affect. Her behavior is normal.  Vitals reviewed.    Filed Vitals:   06/04/16 1549  BP: 124/75  Pulse: 106  Temp: 97.8 F (36.6 C)  TempSrc: Oral  Resp: 16  Height: 5' 3.5" (1.613 m)  Weight: 249 lb 12.8 oz (113.309 kg)  SpO2: 96%   EKG viewed and interpreted by Dr Carlota Raspberry: EKG shows sinus rhythm with  no acute findings.     Assessment & Plan:   Veronica Martinez is a 60 y.o. female Chest pain, unspecified chest pain type - Plan: EKG 12-Lead  Heartburn  Acute maxillary sinusitis, recurrence not specified - Plan: amoxicillin-clavulanate (AUGMENTIN) 875-125 MG tablet   Recent acute maxillary sinusitis, with onset of increased heartburn and chest discomfort after taking prednisone and Biaxin. History of GERD. Also with metallic taste in mouth likely due to Biaxin. No concerning findings on EKG.   -Will stop prednisone and Biaxin, change to Augmentin. Side effects discussed.  -Avoidance of reflux triggers, and if not improving the next 3-4 days, or sooner, return to clinic or ER.   Meds ordered this encounter  Medications  . amoxicillin-clavulanate (AUGMENTIN) 875-125 MG tablet    Sig: Take 1 tablet by mouth 2 (two) times daily.    Dispense:  20 tablet    Refill:  0   Patient Instructions       IF you received an x-ray today, you will receive an invoice from [facility]. Please contact [facility] at [phone] with questions or concerns regarding your invoice.   IF you received labwork today, you will receive an invoice from [employer]. Please contact [facility] at [phone] with questions or concerns regarding your invoice.   Our billing staff will not be able to assist you with questions regarding bills from these companies.  You will be contacted with the lab results as soon as they are available. The fastest way to get your results is to activate your My Chart account. Instructions are located on the last page of this paperwork. If you have not heard from [place] regarding the results in 2 weeks, please contact this office.    Stop clarithromycin, stop prednisone. See foods below to avoid and if not improving in next 3-4 days, return to look into other causes. Return to the clinic or go to the nearest emergency room if any of your  symptoms worsen or new symptoms occur, including any worsened chest pain.    Nonspecific Chest Pain  Chest pain can be caused by many different conditions. There is always a chance that your pain could be related to something serious, such as a heart attack or a blood clot in your lungs. Chest pain can also be caused by conditions that are not life-threatening. If you have chest pain, it is very important to follow up with your health care provider. CAUSES  Chest pain can be caused by:  Heartburn.  Pneumonia or bronchitis.  Anxiety or stress.  Inflammation around your heart (pericarditis) or lung (pleuritis or pleurisy).  A blood clot in your lung.  A collapsed lung (pneumothorax). It can develop suddenly on its own (spontaneous pneumothorax) or from trauma to the chest.  Shingles infection (varicella-zoster virus).  Heart attack.  Damage to the bones, muscles, and cartilage that make up your chest wall. This can include:  Bruised bones due to injury.  Strained muscles or cartilage due to frequent or repeated coughing or overwork.  Fracture to one or more ribs.  Sore cartilage due to inflammation (costochondritis). RISK FACTORS  Risk factors for chest pain may include:  Activities that increase your risk for trauma or injury to your chest.  Respiratory infections or conditions that cause frequent coughing.  Medical conditions or overeating that can cause heartburn.  Heart disease or family history of heart disease.  Conditions or health behaviors that increase your risk of developing a blood clot.  Having had chicken pox (varicella zoster). SIGNS AND SYMPTOMS Chest pain can feel like:  Burning or tingling on the surface of your chest or deep in your chest.  Crushing, pressure, aching, or squeezing pain.  Dull or sharp pain that is worse when you move, cough, or take a deep breath.  Pain that is also felt in your back, neck, shoulder, or arm, or pain that  spreads to any of these areas. Your chest pain may come and go, or it may stay constant. DIAGNOSIS Lab tests or other studies may be needed to find the cause of your pain. Your health care provider may have you take a test called an ambulatory ECG (electrocardiogram). An ECG records your heartbeat patterns at the time the test is performed. You may also have other tests, such as:  Transthoracic echocardiogram (TTE). During echocardiography, sound waves are used to create a picture of all of the heart structures and to look at how blood flows through your heart.  Transesophageal echocardiogram (TEE).This is a more advanced imaging test that obtains images from inside your body. It allows your health care provider to see your heart in finer detail.  Cardiac monitoring. This allows your health care provider to monitor your heart rate and rhythm in real time.  Holter monitor. This is a portable device that records your heartbeat and can help to diagnose abnormal heartbeats. It allows your health care provider to track your heart activity for several days, if needed.  Stress tests. These can be done through exercise or by taking medicine that makes your heart beat more quickly.  Blood tests.  Imaging tests. TREATMENT  Your treatment depends on what is causing your chest pain. Treatment may include:  Medicines. These may include:  Acid blockers for heartburn.  Anti-inflammatory medicine.  Pain medicine for inflammatory conditions.  Antibiotic medicine, if an infection is present.  Medicines to dissolve blood clots.  Medicines to treat coronary artery disease.  Supportive care for conditions that do not require medicines. This may include:  Resting.  Applying heat or cold packs to  injured areas.  Limiting activities until pain decreases. HOME CARE INSTRUCTIONS  If you were prescribed an antibiotic medicine, finish it all even if you start to feel better.  Avoid any activities  that bring on chest pain.  Do not use any tobacco products, including cigarettes, chewing tobacco, or electronic cigarettes. If you need help quitting, ask your health care provider.  Do not drink alcohol.  Take medicines only as directed by your health care provider.  Keep all follow-up visits as directed by your health care provider. This is important. This includes any further testing if your chest pain does not go away.  If heartburn is the cause for your chest pain, you may be told to keep your head raised (elevated) while sleeping. This reduces the chance that acid will go from your stomach into your esophagus.  Make lifestyle changes as directed by your health care provider. These may include:  Getting regular exercise. Ask your health care provider to suggest some activities that are safe for you.  Eating a heart-healthy diet. A registered dietitian can help you to learn healthy eating options.  Maintaining a healthy weight.  Managing diabetes, if necessary.  Reducing stress. SEEK MEDICAL CARE IF:  Your chest pain does not go away after treatment.  You have a rash with blisters on your chest.  You have a fever. SEEK IMMEDIATE MEDICAL CARE IF:   Your chest pain is worse.  You have an increasing cough, or you cough up blood.  You have severe abdominal pain.  You have severe weakness.  You faint.  You have chills.  You have sudden, unexplained chest discomfort.  You have sudden, unexplained discomfort in your arms, back, neck, or jaw.  You have shortness of breath at any time.  You suddenly start to sweat, or your skin gets clammy.  You feel nauseous or you vomit.  You suddenly feel light-headed or dizzy.  Your heart begins to beat quickly, or it feels like it is skipping beats. These symptoms may represent a serious problem that is an emergency. Do not wait to see if the symptoms will go away. Get medical help right away. Call your local emergency  services (911 in the [place] Do not drive yourself to the hospital.   This information is not intended to replace advice given to you by your health care provider. Make sure you discuss any questions you have with your health care provider.   Document Released: 09/18/2005 Document Revised: 12/30/2014 Document Reviewed: 07/15/2014 Elsevier Interactive Patient Education 2016 [place] for Gastroesophageal Reflux Disease, Adult When you have gastroesophageal reflux disease (GERD), the foods you eat and your eating habits are very important. Choosing the right foods can help ease the discomfort of GERD. WHAT GENERAL GUIDELINES DO I NEED TO FOLLOW?  Choose fruits, vegetables, whole grains, low-fat dairy products, and low-fat meat, fish, and poultry.  Limit fats such as oils, salad dressings, butter, nuts, and avocado.  Keep a food diary to identify foods that cause symptoms.  Avoid foods that cause reflux. These may be different for different people.  Eat frequent small meals instead of three large meals each day.  Eat your meals slowly, in a relaxed setting.  Limit fried foods.  Cook foods using methods other than frying.  Avoid drinking alcohol.  Avoid drinking large amounts of liquids with your meals.  Avoid bending over or lying down until 2-3 hours after eating. WHAT FOODS ARE NOT RECOMMENDED? The following are some  foods and drinks that may worsen your symptoms: Vegetables Tomatoes. Tomato juice. Tomato and spaghetti sauce. Chili peppers. Onion and garlic. Horseradish. Fruits Oranges, grapefruit, and lemon (fruit and juice). Meats High-fat meats, fish, and poultry. This includes hot dogs, ribs, ham, sausage, salami, and bacon. Dairy Whole milk and chocolate milk. Sour cream. Cream. Butter. Ice cream. Cream cheese.  Beverages Coffee and tea, with or without caffeine. Carbonated beverages or energy drinks. Condiments Hot sauce. Barbecue sauce.    Sweets/Desserts Chocolate and cocoa. Donuts. Peppermint and spearmint. Fats and Oils High-fat foods, including [place] fries and potato chips. Other Vinegar. Strong spices, such as black pepper, white pepper, red pepper, cayenne, curry powder, cloves, ginger, and chili powder. The items listed above may not be a complete list of foods and beverages to avoid. Contact your dietitian for more information.   This information is not intended to replace advice given to you by your health care provider. Make sure you discuss any questions you have with your health care provider.   Document Released: 12/09/2005 Document Revised: 12/30/2014 Document Reviewed: 10/13/2013 Elsevier Interactive Patient Education [employer].     I personally performed the services described in this documentation, which was scribed in my presence. The recorded information has been reviewed and considered, and addended by me as needed.   Signed,   [name], MD [facility].  06/04/2016 6:11 PM

## 2016-06-04 NOTE — Patient Instructions (Addendum)
IF you received an x-ray today, you will receive an invoice from [facility]. Please contact [facility] at [phone] with questions or concerns regarding your invoice.   IF you received labwork today, you will receive an invoice from [employer]. Please contact [facility] at [phone] with questions or concerns regarding your invoice.   Our billing staff will not be able to assist you with questions regarding bills from these companies.  You will be contacted with the lab results as soon as they are available. The fastest way to get your results is to activate your My Chart account. Instructions are located on the last page of this paperwork. If you have not heard from [place] regarding the results in 2 weeks, please contact this office.    Stop clarithromycin, stop prednisone. See foods below to avoid and if not improving in next 3-4 days, return to look into other causes. Return to the clinic or go to the nearest emergency room if any of your symptoms worsen or new symptoms occur, including any worsened chest pain.    Nonspecific Chest Pain  Chest pain can be caused by many different conditions. There is always a chance that your pain could be related to something serious, such as a heart attack or a blood clot in your lungs. Chest pain can also be caused by conditions that are not life-threatening. If you have chest pain, it is very important to follow up with your health care provider. CAUSES  Chest pain can be caused by:  Heartburn.  Pneumonia or bronchitis.  Anxiety or stress.  Inflammation around your heart (pericarditis) or lung (pleuritis or pleurisy).  A blood clot in your lung.  A collapsed lung (pneumothorax). It can develop suddenly on its own (spontaneous pneumothorax) or from trauma to the chest.  Shingles infection (varicella-zoster virus).  Heart attack.  Damage to the bones, muscles, and cartilage that make up your  chest wall. This can include:  Bruised bones due to injury.  Strained muscles or cartilage due to frequent or repeated coughing or overwork.  Fracture to one or more ribs.  Sore cartilage due to inflammation (costochondritis). RISK FACTORS  Risk factors for chest pain may include:  Activities that increase your risk for trauma or injury to your chest.  Respiratory infections or conditions that cause frequent coughing.  Medical conditions or overeating that can cause heartburn.  Heart disease or family history of heart disease.  Conditions or health behaviors that increase your risk of developing a blood clot.  Having had chicken pox (varicella zoster). SIGNS AND SYMPTOMS Chest pain can feel like:  Burning or tingling on the surface of your chest or deep in your chest.  Crushing, pressure, aching, or squeezing pain.  Dull or sharp pain that is worse when you move, cough, or take a deep breath.  Pain that is also felt in your back, neck, shoulder, or arm, or pain that spreads to any of these areas. Your chest pain may come and go, or it may stay constant. DIAGNOSIS Lab tests or other studies may be needed to find the cause of your pain. Your health care provider may have you take a test called an ambulatory ECG (electrocardiogram). An ECG records your heartbeat patterns at the time the test is performed. You may also have other tests, such as:  Transthoracic echocardiogram (TTE). During echocardiography, sound waves are used to create a picture of all of the heart structures and to look at  how blood flows through your heart.  Transesophageal echocardiogram (TEE).This is a more advanced imaging test that obtains images from inside your body. It allows your health care provider to see your heart in finer detail.  Cardiac monitoring. This allows your health care provider to monitor your heart rate and rhythm in real time.  Holter monitor. This is a portable device that records  your heartbeat and can help to diagnose abnormal heartbeats. It allows your health care provider to track your heart activity for several days, if needed.  Stress tests. These can be done through exercise or by taking medicine that makes your heart beat more quickly.  Blood tests.  Imaging tests. TREATMENT  Your treatment depends on what is causing your chest pain. Treatment may include:  Medicines. These may include:  Acid blockers for heartburn.  Anti-inflammatory medicine.  Pain medicine for inflammatory conditions.  Antibiotic medicine, if an infection is present.  Medicines to dissolve blood clots.  Medicines to treat coronary artery disease.  Supportive care for conditions that do not require medicines. This may include:  Resting.  Applying heat or cold packs to injured areas.  Limiting activities until pain decreases. HOME CARE INSTRUCTIONS  If you were prescribed an antibiotic medicine, finish it all even if you start to feel better.  Avoid any activities that bring on chest pain.  Do not use any tobacco products, including cigarettes, chewing tobacco, or electronic cigarettes. If you need help quitting, ask your health care provider.  Do not drink alcohol.  Take medicines only as directed by your health care provider.  Keep all follow-up visits as directed by your health care provider. This is important. This includes any further testing if your chest pain does not go away.  If heartburn is the cause for your chest pain, you may be told to keep your head raised (elevated) while sleeping. This reduces the chance that acid will go from your stomach into your esophagus.  Make lifestyle changes as directed by your health care provider. These may include:  Getting regular exercise. Ask your health care provider to suggest some activities that are safe for you.  Eating a heart-healthy diet. A registered dietitian can help you to learn healthy eating  options.  Maintaining a healthy weight.  Managing diabetes, if necessary.  Reducing stress. SEEK MEDICAL CARE IF:  Your chest pain does not go away after treatment.  You have a rash with blisters on your chest.  You have a fever. SEEK IMMEDIATE MEDICAL CARE IF:   Your chest pain is worse.  You have an increasing cough, or you cough up blood.  You have severe abdominal pain.  You have severe weakness.  You faint.  You have chills.  You have sudden, unexplained chest discomfort.  You have sudden, unexplained discomfort in your arms, back, neck, or jaw.  You have shortness of breath at any time.  You suddenly start to sweat, or your skin gets clammy.  You feel nauseous or you vomit.  You suddenly feel light-headed or dizzy.  Your heart begins to beat quickly, or it feels like it is skipping beats. These symptoms may represent a serious problem that is an emergency. Do not wait to see if the symptoms will go away. Get medical help right away. Call your local emergency services (911 in the [place] Do not drive yourself to the hospital.   This information is not intended to replace advice given to you by your health care provider. Make sure  you discuss any questions you have with your health care provider.   Document Released: 09/18/2005 Document Revised: 12/30/2014 Document Reviewed: 07/15/2014 Elsevier Interactive Patient Education 2016 [employer] [employer] for Gastroesophageal Reflux Disease, Adult When you have gastroesophageal reflux disease (GERD), the foods you eat and your eating habits are very important. Choosing the right foods can help ease the discomfort of GERD. WHAT GENERAL GUIDELINES DO I NEED TO FOLLOW?  Choose fruits, vegetables, whole grains, low-fat dairy products, and low-fat meat, fish, and poultry.  Limit fats such as oils, salad dressings, butter, nuts, and avocado.  Keep a food diary to identify foods that cause symptoms.  Avoid  foods that cause reflux. These may be different for different people.  Eat frequent small meals instead of three large meals each day.  Eat your meals slowly, in a relaxed setting.  Limit fried foods.  Cook foods using methods other than frying.  Avoid drinking alcohol.  Avoid drinking large amounts of liquids with your meals.  Avoid bending over or lying down until 2-3 hours after eating. WHAT FOODS ARE NOT RECOMMENDED? The following are some foods and drinks that may worsen your symptoms: Vegetables Tomatoes. Tomato juice. Tomato and spaghetti sauce. Chili peppers. Onion and garlic. Horseradish. Fruits Oranges, grapefruit, and lemon (fruit and juice). Meats High-fat meats, fish, and poultry. This includes hot dogs, ribs, ham, sausage, salami, and bacon. Dairy Whole milk and chocolate milk. Sour cream. Cream. Butter. Ice cream. Cream cheese.  Beverages Coffee and tea, with or without caffeine. Carbonated beverages or energy drinks. Condiments Hot sauce. Barbecue sauce.  Sweets/Desserts Chocolate and cocoa. Donuts. Peppermint and spearmint. Fats and Oils High-fat foods, including [place] fries and potato chips. Other Vinegar. Strong spices, such as black pepper, white pepper, red pepper, cayenne, curry powder, cloves, ginger, and chili powder. The items listed above may not be a complete list of foods and beverages to avoid. Contact your dietitian for more information.   This information is not intended to replace advice given to you by your health care provider. Make sure you discuss any questions you have with your health care provider.   Document Released: 12/09/2005 Document Revised: 12/30/2014 Document Reviewed: 10/13/2013 Elsevier Interactive Patient Education [employer].

## 2016-06-24 ENCOUNTER — Ambulatory Visit: Payer: PPO | Admitting: Family Medicine

## 2016-06-26 ENCOUNTER — Ambulatory Visit: Payer: PPO | Admitting: Family Medicine

## 2016-06-27 ENCOUNTER — Ambulatory Visit (INDEPENDENT_AMBULATORY_CARE_PROVIDER_SITE_OTHER): Payer: PPO | Admitting: Family Medicine

## 2016-06-27 ENCOUNTER — Encounter: Payer: Self-pay | Admitting: Family Medicine

## 2016-06-27 VITALS — BP 126/72 | HR 80 | Temp 98.0°F | Resp 16 | Ht 63.5 in | Wt 246.6 lb

## 2016-06-27 DIAGNOSIS — R7303 Prediabetes: Secondary | ICD-10-CM

## 2016-06-27 DIAGNOSIS — E785 Hyperlipidemia, unspecified: Secondary | ICD-10-CM | POA: Diagnosis not present

## 2016-06-27 DIAGNOSIS — D72819 Decreased white blood cell count, unspecified: Secondary | ICD-10-CM | POA: Diagnosis not present

## 2016-06-27 DIAGNOSIS — J329 Chronic sinusitis, unspecified: Secondary | ICD-10-CM | POA: Diagnosis not present

## 2016-06-27 LAB — CBC
HCT: 42.1 % (ref 35.0–45.0)
Hemoglobin: 13.9 g/dL (ref 11.7–15.5)
MCH: 31.2 pg (ref 27.0–33.0)
MCHC: 33 g/dL (ref 32.0–36.0)
MCV: 94.6 fL (ref 80.0–100.0)
MPV: 11.2 fL (ref 7.5–12.5)
Platelets: 200 10*3/uL (ref 140–400)
RBC: 4.45 MIL/uL (ref 3.80–5.10)
RDW: 14.2 % (ref 11.0–15.0)
WBC: 4 10*3/uL (ref 3.8–10.8)

## 2016-06-27 LAB — HEMOGLOBIN A1C
Hgb A1c MFr Bld: 5.5 % (ref <5.7)
Mean Plasma Glucose: 111 mg/dL

## 2016-06-27 LAB — LIPID PANEL
Cholesterol: 189 mg/dL (ref 125–200)
HDL: 46 mg/dL (ref >=46)
LDL Cholesterol: 114 mg/dL (ref <130)
Total CHOL/HDL Ratio: 4.1 Ratio (ref <=5.0)
Triglycerides: 146 mg/dL (ref <150)
VLDL: 29 mg/dL (ref <30)

## 2016-06-27 LAB — COMPLETE METABOLIC PANEL WITH GFR
ALT: 10 U/L (ref 6–29)
AST: 12 U/L (ref 10–35)
Albumin: 3.8 g/dL (ref 3.6–5.1)
Alkaline Phosphatase: 57 U/L (ref 33–130)
BUN: 14 mg/dL (ref 7–25)
CO2: 27 mmol/L (ref 20–31)
Calcium: 8.7 mg/dL (ref 8.6–10.4)
Chloride: 104 mmol/L (ref 98–110)
Creat: 0.97 mg/dL (ref 0.50–0.99)
GFR, Est African American: 72 mL/min (ref >=60)
GFR, Est Non African American: 63 mL/min (ref >=60)
Glucose, Bld: 99 mg/dL (ref 65–99)
Potassium: 4 mmol/L (ref 3.5–5.3)
Sodium: 140 mmol/L (ref 135–146)
Total Bilirubin: 0.6 mg/dL (ref 0.2–1.2)
Total Protein: 5.9 g/dL — ABNORMAL LOW (ref 6.1–8.1)

## 2016-06-27 MED ORDER — CRESTOR 10 MG PO TABS: 5.0000 mg | ORAL_TABLET | Freq: Every day | ORAL | Status: DC

## 2016-06-27 NOTE — Progress Notes (Addendum)
By signing my name below, I, [name], attest that this documentation has been prepared under the direction and in the presence of [name], MD.  Electronically Signed: [name], Medical Scribe. 06/27/2016. 9:36 AM.  Subjective:    Patient ID: Veronica Martinez, female    DOB: [birth year], 60 y.o.   MRN: [id number]  HPI Chief Complaint  Patient presents with  . Establish Care    was a pt. of Dr. Everlene Farrier    HPI Comments: Veronica Martinez is a 60 y.o. female with a PMHx of multiple medical problems who presents to the [facility] est. care. New pt to me, but previous pt to Dr. Everlene Farrier. Pt has a PMHx of reflux, obesity, various arthralgias per problem list, and HLD. Pt is fasting today.  Chronic Sinusitis: Pt reports HA, congestion, and sinus pressure. Pt takes Allegra for relief to her symptoms.  HLD: Pt takes Crestor. Lab Results  Component Value Date   CHOL 138 12/26/2015   HDL 87 12/26/2015   LDLCALC 40 12/26/2015   TRIG 54 12/26/2015   CHOLHDL 1.6 12/26/2015   Lab Results  Component Value Date   ALT 19 01/10/2015   AST 18 01/10/2015   ALKPHOS 92 01/10/2015   BILITOT 0.4 01/10/2015   Obesity/ Pre DM: Wt Readings from Last 3 Encounters:  06/27/16 246 lb 9.6 oz (111.857 kg)  06/04/16 249 lb 12.8 oz (113.309 kg)  06/01/16 247 lb 4 oz (112.152 kg)   Lab Results  Component Value Date   HGBA1C 6.0* 12/26/2015   Leukocytopenia: Planing to recheck today. Lab Results  Component Value Date   WBC 3.7* 12/26/2015   HGB 14.2 12/26/2015   HCT 41.3 12/26/2015   MCV 92.2 12/26/2015   PLT 170 12/26/2015   Hypothyroidism: Pt is followed by Dr. Chalmers Cater. Lab Results  Component Value Date   TSH 1.671 12/26/2015   Diet/Exercise: Pt mainly eats salad and occasionally eats from [employer]. Pt eats breakfast, and dinner is her largest meal. Pt denies soda and alcohol consumption. Pt walks about 30 mins every day.  Patient Active Problem List   Diagnosis Date Noted  . 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 Right hip pain 08/09/2013  . Trigger finger, acquired 04/22/2013  . Carpal tunnel syndrome of right wrist 04/22/2013  . Pain of right heel 12/08/2012  . Myalgia 11/10/2012  . OBESITY, UNSPECIFIED 02/20/2011  . BACK PAIN, LUMBAR 07/05/2010  . ABNORMALITY OF GAIT 05/02/2010  . MERALGIA PARESTHETICA 03/02/2010  . HIP PAIN, BILATERAL 03/02/2010   Past Medical History  Diagnosis Date  . Allergy   . History of Graves' disease   . Hx of radioactive iodine thyroid ablation   . Hypothyroidism   . GERD (gastroesophageal reflux disease)   . High cholesterol   . Headache(784.0)     sinus   Past Surgical History  Procedure Laterality Date  . Shoulder surgery    . Appendectomy    . Joint replacement    . Abdominal hysterectomy    . Hip arthroplasty Bilateral   . Other surgical history Right     carpal tunel injection  . Cataract extraction w/phaco Right 09/22/2013    Procedure: CATARACT EXTRACTION PHACO AND INTRAOCULAR LENS PLACEMENT (IOC);  Surgeon: [name], MD;  Location: [place];  Service: Ophthalmology;  Laterality: Right;  . Eye surgery    . Tonsillectomy    . Colonoscopy    .  Cataract extraction w/phaco Left 12/29/2013    Procedure: CATARACT EXTRACTION PHACO AND INTRAOCULAR LENS PLACEMENT (IOC);  Surgeon: [name], MD;  Location: [place];  Service: Ophthalmology;  Laterality: Left;   Allergies  Allergen Reactions  . Meloxicam Other (See Comments)    Causes Bruising   . Pravastatin Sodium Other (See Comments)    Bruises  . Simvastatin Other (See Comments)    Bruises   . Tramadol Hcl Other (See Comments)    Bruising    Prior to Admission medications   Medication Sig Start Date End Date Taking? Authorizing Provider  Cholecalciferol (VITAMIN D) 2000 UNITS CAPS Take 2,000 Units by mouth daily.    Yes Historical Provider, MD  Coenzyme Q10 (CO Q 10  PO) Take 75 mg by mouth 3 (three) times daily.   Yes Historical Provider, MD  CRESTOR 10 MG tablet TAKE ONE TABLET BY MOUTH ONCE DAILY 04/25/16  Yes Ezekiel Slocumb, PA-C  cyanocobalamin 2000 MCG tablet Take 2,000 mcg by mouth daily. Vitamin B12   Yes Historical Provider, MD  fluticasone (FLONASE) 50 MCG/ACT nasal spray TAKE 2 PUFFS EACH NOSTRIL EVERY DAY 03/19/16  Yes Robyn Haber, MD  gabapentin (NEURONTIN) 600 MG tablet Take 1 tablet (600 mg total) by mouth 3 (three) times daily. 02/28/16  Yes Stefanie Libel, MD  Guaifenesin [facility] MAXIMUM STRENGTH) 1200 MG TB12 Take 1 tablet (1,200 mg total) by mouth every 12 (twelve) hours as needed. 05/10/16  Yes Stephanie D English, PA  ibuprofen (ADVIL,MOTRIN) 600 MG tablet TAKE 1 TABLET BY MOUTH EVERY 6 TO 8 HOURS AS NEEDED FOR PAIN 03/25/16  Yes Historical Provider, MD  Misc Natural Products (CALCIUM PLUS ADVANCED PO) Take 600 mg by mouth 2 (two) times daily.    Yes Historical Provider, MD  Multiple Vitamins-Minerals (CENTRUM SILVER PO) Take by mouth.   Yes Historical Provider, MD  Omega-3 Fatty Acids (FISH OIL) 1200 MG CAPS Take 1,200 mg by mouth 3 (three) times daily.    Yes Historical Provider, MD  pantoprazole (PROTONIX) 40 MG tablet Take 40 mg by mouth daily.    Yes Historical Provider, MD  SYNTHROID 100 MCG tablet Take 100 mcg by mouth daily. 07/15/15  Yes Historical Provider, MD  vitamin E 400 UNIT capsule Take 400 Units by mouth daily.   Yes Historical Provider, MD  zoster vaccine live, PF, (ZOSTAVAX) 13086 UNT/0.65ML injection Inject 19,400 Units into the skin once. 12/26/15  Yes Darlyne Russian, MD  naproxen sodium (ALEVE) 220 MG tablet Take 1 tablet (220 mg total) by mouth 2 (two) times daily as needed. Patient not taking: Reported on 06/27/2016 02/28/16   Stefanie Libel, MD  terbinafine (LAMISIL) 250 MG tablet Take one tablet once daily x 7 days, then repeat every month x 4 months Patient not taking: Reported on 06/04/2016 02/19/16   Wallene Huh, DPM   Social  History   Social History  . Marital Status: Married    Spouse Name: N/A  . Number of Children: N/A  . Years of Education: N/A   Occupational History  . Not on file.   Social History Main Topics  . Smoking status: Never Smoker   . Smokeless tobacco: Never Used  . Alcohol Use: No  . Drug Use: No  . Sexual Activity:    Partners: Male   Other Topics Concern  . Not on file   Social History Narrative   Married. Education: [employer].    Review of Systems  HENT: Positive for congestion  and sinus pressure.   Musculoskeletal: Negative for myalgias and arthralgias.  Neurological: Positive for headaches.   Objective:  BP 126/72 mmHg  Pulse 80  Temp(Src) 98 F (36.7 C) (Oral)  Resp 16  Ht 5' 3.5" (1.613 m)  Wt 246 lb 9.6 oz (111.857 kg)  BMI 42.99 kg/m2  SpO2 97%  Physical Exam  Constitutional: She is oriented to person, place, and time. She appears well-developed and well-nourished.  HENT:  Head: Normocephalic and atraumatic.  Eyes: Conjunctivae and EOM are normal. Pupils are equal, round, and reactive to light.  Neck: Carotid bruit is not present.  Cardiovascular: Normal rate, regular rhythm, normal heart sounds and intact distal pulses.   Pulmonary/Chest: Effort normal and breath sounds normal.  Abdominal: Soft. She exhibits no pulsatile midline mass. There is no tenderness.  Neurological: She is alert and oriented to person, place, and time.  Skin: Skin is warm and dry.  Psychiatric: She has a normal mood and affect. Her behavior is normal.  Vitals reviewed.     Assessment & Plan:   Veronica Martinez is a 60 y.o. female Hyperlipidemia - Plan: CRESTOR 10 MG tablet, COMPLETE METABOLIC PANEL WITH GFR, Lipid panel  -Labs pending. Tolerating Crestor at current dose. No change in medications at this time. Continue 5 mg dose.  Chronic sinusitis, unspecified location - Plan: Ambulatory referral to ENT  -Recurrent, chronic sinusitis by history. Will refer to ENT for  further evaluation. Continue Flonase for now.  Prediabetes - Plan: Hemoglobin A1c  -Check A1c. Recommended changing large meal to lunch, other portion control and weight management techniques discussed. Exercise most days a week.  Leukocytopenia - Plan: CBC  -Repeat CBC. Asymptomatic.  Meds ordered this encounter  Medications  . CRESTOR 10 MG tablet    Sig: Take 0.5 tablets (5 mg total) by mouth daily.    Dispense:  90 tablet    Refill:  0   Patient Instructions       IF you received an x-ray today, you will receive an invoice from [facility]. Please contact [facility] at [phone] with questions or concerns regarding your invoice.   IF you received labwork today, you will receive an invoice from [employer]. Please contact [facility] at [phone] with questions or concerns regarding your invoice.   Our billing staff will not be able to assist you with questions regarding bills from these companies.  You will be contacted with the lab results as soon as they are available. The fastest way to get your results is to activate your My Chart account. Instructions are located on the last page of this paperwork. If you have not heard from [place] regarding the results in 2 weeks, please contact this office.     We'll refer you to ear nose and throat for your chronic sinus issues. No change in medications for now. Try to decrease portion sizes at dinner, with lunch being your largest meal of the day. Continue the good work with exercise and follow-up with me in 6 months.    I personally performed the services described in this documentation, which was scribed in my presence. The recorded information has been reviewed and considered, and addended by me as needed.   Signed,   [name], MD [facility].  06/27/2016 6:06 PM

## 2016-06-27 NOTE — Patient Instructions (Addendum)
     IF you received an x-ray today, you will receive an invoice from [facility]. Please contact [facility] at [phone] with questions or concerns regarding your invoice.   IF you received labwork today, you will receive an invoice from [employer]. Please contact [facility] at [phone] with questions or concerns regarding your invoice.   Our billing staff will not be able to assist you with questions regarding bills from these companies.  You will be contacted with the lab results as soon as they are available. The fastest way to get your results is to activate your My Chart account. Instructions are located on the last page of this paperwork. If you have not heard from [place] regarding the results in 2 weeks, please contact this office.     We'll refer you to ear nose and throat for your chronic sinus issues. No change in medications for now. Try to decrease portion sizes at dinner, with lunch being your largest meal of the day. Continue the good work with exercise and follow-up with me in 6 months.

## 2016-07-05 ENCOUNTER — Telehealth: Payer: Self-pay

## 2016-07-05 NOTE — Telephone Encounter (Signed)
Got notice from pharm that crestor is not being covered by ins. Spoke to pharm who reported that ins only covers the generic and this Rx was written as name brand. Called pt who stated that she used to take the name brand, but started the generic in May d/t ins. Pt stated she would like to stay with generic for cost savings for now and discuss with Dr Carlota Raspberry at next [place]. Sent message back to pharm to fill with generic.

## 2016-07-06 ENCOUNTER — Ambulatory Visit (INDEPENDENT_AMBULATORY_CARE_PROVIDER_SITE_OTHER): Payer: PPO | Admitting: Family Medicine

## 2016-07-06 VITALS — BP 140/78 | HR 93 | Temp 97.9°F | Resp 18 | Ht 64.0 in | Wt 246.5 lb

## 2016-07-06 DIAGNOSIS — R519 Headache, unspecified: Secondary | ICD-10-CM

## 2016-07-06 DIAGNOSIS — R51 Headache: Secondary | ICD-10-CM | POA: Diagnosis not present

## 2016-07-06 MED ORDER — KETOROLAC TROMETHAMINE 60 MG/2ML IM SOLN
60.0000 mg | Freq: Once | INTRAMUSCULAR | Status: AC
  Administered 2016-07-06: 60 mg via INTRAMUSCULAR

## 2016-07-06 MED ORDER — CEFDINIR 300 MG PO CAPS: 300.0000 mg | ORAL_CAPSULE | Freq: Two times a day (BID) | ORAL | Status: DC

## 2016-07-06 NOTE — Patient Instructions (Addendum)
Thank you for coming in today. Take 2 over-the-counter Benadryl pills (50 mg total) when he get home. Take antibiotics starting today or tomorrow. Follow-up with ear nose and throat or return to clinic.  Go to the emergency room if your headache becomes excruciating or you have weakness or numbness or uncontrolled vomiting.   Sinusitis, Adult Sinusitis is redness, soreness, and inflammation of the paranasal sinuses. Paranasal sinuses are air pockets within the bones of your face. They are located beneath your eyes, in the middle of your forehead, and above your eyes. In healthy paranasal sinuses, mucus is able to drain out, and air is able to circulate through them by way of your nose. However, when your paranasal sinuses are inflamed, mucus and air can become trapped. This can allow bacteria and other germs to grow and cause infection. Sinusitis can develop quickly and last only a short time (acute) or continue over a long period (chronic). Sinusitis that lasts for more than 12 weeks is considered chronic. CAUSES Causes of sinusitis include:  Allergies.  Structural abnormalities, such as displacement of the cartilage that separates your nostrils (deviated septum), which can decrease the air flow through your nose and sinuses and affect sinus drainage.  Functional abnormalities, such as when the small hairs (cilia) that line your sinuses and help remove mucus do not work properly or are not present. SIGNS AND SYMPTOMS Symptoms of acute and chronic sinusitis are the same. The primary symptoms are pain and pressure around the affected sinuses. Other symptoms include:  Upper toothache.  Earache.  Headache.  Bad breath.  Decreased sense of smell and taste.  A cough, which worsens when you are lying flat.  Fatigue.  Fever.  Thick drainage from your nose, which often is green and may contain pus (purulent).  Swelling and warmth over the affected sinuses. DIAGNOSIS Your health  care provider will perform a physical exam. During your exam, your health care provider may perform any of the following to help determine if you have acute sinusitis or chronic sinusitis:  Look in your nose for signs of abnormal growths in your nostrils (nasal polyps).  Tap over the affected sinus to check for signs of infection.  View the inside of your sinuses using an imaging device that has a light attached (endoscope). If your health care provider suspects that you have chronic sinusitis, one or more of the following tests may be recommended:  Allergy tests.  Nasal culture. A sample of mucus is taken from your nose, sent to a lab, and screened for bacteria.  Nasal cytology. A sample of mucus is taken from your nose and examined by your health care provider to determine if your sinusitis is related to an allergy. TREATMENT Most cases of acute sinusitis are related to a viral infection and will resolve on their own within 10 days. Sometimes, medicines are prescribed to help relieve symptoms of both acute and chronic sinusitis. These may include pain medicines, decongestants, nasal steroid sprays, or saline sprays. However, for sinusitis related to a bacterial infection, your health care provider will prescribe antibiotic medicines. These are medicines that will help kill the bacteria causing the infection. Rarely, sinusitis is caused by a fungal infection. In these cases, your health care provider will prescribe antifungal medicine. For some cases of chronic sinusitis, surgery is needed. Generally, these are cases in which sinusitis recurs more than 3 times per year, despite other treatments. HOME CARE INSTRUCTIONS  Drink plenty of water. Water helps thin the mucus  so your sinuses can drain more easily.  Use a humidifier.  Inhale steam 3-4 times a day (for example, sit in the bathroom with the shower running).  Apply a warm, moist washcloth to your face 3-4 times a day, or as directed  by your health care provider.  Use saline nasal sprays to help moisten and clean your sinuses.  Take medicines only as directed by your health care provider.  If you were prescribed either an antibiotic or antifungal medicine, finish it all even if you start to feel better. SEEK IMMEDIATE MEDICAL CARE IF:  You have increasing pain or severe headaches.  You have nausea, vomiting, or drowsiness.  You have swelling around your face.  You have vision problems.  You have a stiff neck.  You have difficulty breathing.   This information is not intended to replace advice given to you by your health care provider. Make sure you discuss any questions you have with your health care provider.   Document Released: 12/09/2005 Document Revised: 12/30/2014 Document Reviewed: 12/24/2011 Elsevier Interactive Patient Education 2016 [employer].    IF you received an x-ray today, you will receive an invoice from [facility]. Please contact [facility] at [phone] with questions or concerns regarding your invoice.   IF you received labwork today, you will receive an invoice from [employer]. Please contact [facility] at [phone] with questions or concerns regarding your invoice.   Our billing staff will not be able to assist you with questions regarding bills from these companies.  You will be contacted with the lab results as soon as they are available. The fastest way to get your results is to activate your My Chart account. Instructions are located on the last page of this paperwork. If you have not heard from [place] regarding the results in 2 weeks, please contact this office.

## 2016-07-06 NOTE — Progress Notes (Signed)
Veronica Martinez is a 60 y.o. female who presents to [facility] today for headache. 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 She's had headaches off and on most of her life. Over the last month she's had worsening headaches in the frontal part of her face and head. Recently in the last week or so she's had a worsening intractable headache. She notes is located in the left  maxillary and frontal sinus area. She thinks the headache is consistent with previous episodes of sinusitis. She denies any fevers or chills loss of function dizziness or lightheadedness. She does note some photophobia and mild nausea. She has tried some over-the-counter medicines which have not been effective at all.   Past Medical History  Diagnosis Date  . Allergy   . History of Graves' disease   . Hx of radioactive iodine thyroid ablation   . Hypothyroidism   . GERD (gastroesophageal reflux disease)   . High cholesterol   . Headache(784.0)     sinus   Past Surgical History  Procedure Laterality Date  . Shoulder surgery    . Appendectomy    . Joint replacement    . Abdominal hysterectomy    . Hip arthroplasty Bilateral   . Other surgical history Right     carpal tunel injection  . Cataract extraction w/phaco Right 09/22/2013    Procedure: CATARACT EXTRACTION PHACO AND INTRAOCULAR LENS PLACEMENT (IOC);  Surgeon: [name], MD;  Location: [place];  Service: Ophthalmology;  Laterality: Right;  . Eye surgery    . Tonsillectomy    . Colonoscopy    . Cataract extraction w/phaco Left 12/29/2013    Procedure: CATARACT EXTRACTION PHACO AND INTRAOCULAR LENS PLACEMENT (IOC);  Surgeon: [name], MD;  Location: [place];  Service: Ophthalmology;  Laterality: Left;   Social History  Substance Use Topics  . Smoking status: Never Smoker   . Smokeless tobacco: Never Used  . Alcohol Use: No   ROS as above Medications: Current Outpatient  Prescriptions  Medication Sig Dispense Refill  . Cholecalciferol (VITAMIN D) 2000 UNITS CAPS Take 2,000 Units by mouth daily.     . Coenzyme Q10 (CO Q 10 PO) Take 75 mg by mouth 3 (three) times daily.    . CRESTOR 10 MG tablet Take 0.5 tablets (5 mg total) by mouth daily. 90 tablet 0  . cyanocobalamin 2000 MCG tablet Take 2,000 mcg by mouth daily. Vitamin B12    . fluticasone (FLONASE) 50 MCG/ACT nasal spray TAKE 2 PUFFS EACH NOSTRIL EVERY DAY 48 g 2  . gabapentin (NEURONTIN) 600 MG tablet Take 1 tablet (600 mg total) by mouth 3 (three) times daily. 90 tablet 6  . Guaifenesin (MUCINEX MAXIMUM STRENGTH) 1200 MG TB12 Take 1 tablet (1,200 mg total) by mouth every 12 (twelve) hours as needed. 14 tablet 1  . ibuprofen (ADVIL,MOTRIN) 600 MG tablet TAKE 1 TABLET BY MOUTH EVERY 6 TO 8 HOURS AS NEEDED FOR PAIN  2  . Misc Natural Products (CALCIUM PLUS ADVANCED PO) Take 600 mg by mouth 2 (two) times daily.     . Multiple Vitamins-Minerals (CENTRUM SILVER PO) Take by mouth.    . Omega-3 Fatty Acids (FISH OIL) 1200 MG CAPS Take 1,200 mg by mouth 3 (three) times daily.     . pantoprazole (PROTONIX) 40 MG tablet Take 40 mg by mouth daily.     [name] SYNTHROID 100 MCG tablet Take 100  mcg by mouth daily.  4  . vitamin E 400 UNIT capsule Take 400 Units by mouth daily.    [name] zoster vaccine live, PF, (ZOSTAVAX) 29562 UNT/0.65ML injection Inject 19,400 Units into the skin once. 1 each 0   Current Facility-Administered Medications  Medication Dose Route Frequency Provider Last Rate Last Dose  . ketorolac (TORADOL) injection 60 mg  60 mg Intramuscular Once Gregor Hams, MD       Allergies  Allergen Reactions  . Meloxicam Other (See Comments)    Causes Bruising   . Pravastatin Sodium Other (See Comments)    Bruises  . Simvastatin Other (See Comments)    Bruises   . Tramadol Hcl Other (See Comments)    Bruising      Exam:  BP 140/78 mmHg  Pulse 93  Temp(Src) 97.9 F (36.6 C) (Oral)  Resp 18  Ht 5\' 4"   (1.626 m)  Wt 246 lb 8 oz (111.812 kg)  BMI 42.29 kg/m2  SpO2 98% Gen: Well NAD HEENT: EOMI,  MMM Tender to palpation left maxillary sinus area. Nontender frontal and right maxillary sinus. Clear nasal discharge with inflamed nasal turbinates are laterally. Normal posterior pharynx. Normal Tympanic membranes. PERRL Lungs: Normal work of breathing. CTABL Heart: RRR no MRG Abd: NABS, Soft. Nondistended, Nontender Exts: Brisk capillary refill, warm and well perfused.  Neuro: Alert and oriented normal speech thought process and affect. Normal balance coordination reflexes sensation and gait.  Patient was given 60 mg of Toradol IM prior to dc.   Lab Results  Component Value Date   CREATININE 0.97 06/27/2016     No results found for this or any previous visit (from the past 24 hour(s)). No results found.  Assessment and Plan: 60 y.o. female with  Headache. I suspect this is sinusitis related. She has an appointment with ear nose and throat in a few weeks. In the meantime plan to treat with Toradol as above. Patient has a she's intolerant of prednisone. Will use Omnicef antibiotics and continued nasal spray. Follow-up with PCP or ENT as needed.  Note: Toradol given. Patient has a listed allergy to meloxicam. Upon review this is bruising and non-anaphylactic type. I feel this is a safe medicine to give especially considering that her creatinine was normal recently.  Discussed warning signs or symptoms. Please see discharge instructions. Patient expresses understanding.

## 2016-07-16 DIAGNOSIS — E039 Hypothyroidism, unspecified: Secondary | ICD-10-CM | POA: Diagnosis not present

## 2016-07-29 DIAGNOSIS — M26629 Arthralgia of temporomandibular joint, unspecified side: Secondary | ICD-10-CM | POA: Diagnosis not present

## 2016-07-29 DIAGNOSIS — H9201 Otalgia, right ear: Secondary | ICD-10-CM | POA: Diagnosis not present

## 2016-09-04 DIAGNOSIS — H01022 Squamous blepharitis right lower eyelid: Secondary | ICD-10-CM | POA: Diagnosis not present

## 2016-09-04 DIAGNOSIS — H0264 Xanthelasma of left upper eyelid: Secondary | ICD-10-CM | POA: Diagnosis not present

## 2016-09-04 DIAGNOSIS — H01021 Squamous blepharitis right upper eyelid: Secondary | ICD-10-CM | POA: Diagnosis not present

## 2016-09-04 DIAGNOSIS — H26492 Other secondary cataract, left eye: Secondary | ICD-10-CM | POA: Diagnosis not present

## 2016-09-04 DIAGNOSIS — H01024 Squamous blepharitis left upper eyelid: Secondary | ICD-10-CM | POA: Diagnosis not present

## 2016-09-04 DIAGNOSIS — H10413 Chronic giant papillary conjunctivitis, bilateral: Secondary | ICD-10-CM | POA: Diagnosis not present

## 2016-09-04 DIAGNOSIS — Z961 Presence of intraocular lens: Secondary | ICD-10-CM | POA: Diagnosis not present

## 2016-09-04 DIAGNOSIS — H01025 Squamous blepharitis left lower eyelid: Secondary | ICD-10-CM | POA: Diagnosis not present

## 2016-10-03 DIAGNOSIS — Z79899 Other long term (current) drug therapy: Secondary | ICD-10-CM | POA: Diagnosis not present

## 2016-10-03 DIAGNOSIS — K219 Gastro-esophageal reflux disease without esophagitis: Secondary | ICD-10-CM | POA: Diagnosis not present

## 2016-10-03 DIAGNOSIS — Z1211 Encounter for screening for malignant neoplasm of colon: Secondary | ICD-10-CM | POA: Diagnosis not present

## 2016-10-07 ENCOUNTER — Other Ambulatory Visit: Payer: Self-pay

## 2016-10-07 MED ORDER — ROSUVASTATIN CALCIUM 5 MG PO TABS: 5.0000 mg | ORAL_TABLET | Freq: Every day | ORAL | 0 refills | Status: DC

## 2016-10-07 NOTE — Telephone Encounter (Signed)
Patient's husband called and stated that he had been calling for the past 2 weeks for a refill on the rosuvastatin 5 mg. Refill was sent in for the patient. Advised she needed a follow up in January 2018.

## 2016-10-09 ENCOUNTER — Other Ambulatory Visit: Payer: Self-pay | Admitting: Emergency Medicine

## 2016-10-09 DIAGNOSIS — E785 Hyperlipidemia, unspecified: Secondary | ICD-10-CM

## 2016-10-14 ENCOUNTER — Other Ambulatory Visit: Payer: Self-pay | Admitting: Sports Medicine

## 2016-10-14 DIAGNOSIS — M25532 Pain in left wrist: Secondary | ICD-10-CM

## 2016-10-14 DIAGNOSIS — M25531 Pain in right wrist: Secondary | ICD-10-CM

## 2016-10-25 DIAGNOSIS — K6389 Other specified diseases of intestine: Secondary | ICD-10-CM | POA: Diagnosis not present

## 2016-10-25 DIAGNOSIS — D126 Benign neoplasm of colon, unspecified: Secondary | ICD-10-CM | POA: Diagnosis not present

## 2016-10-25 DIAGNOSIS — Z1211 Encounter for screening for malignant neoplasm of colon: Secondary | ICD-10-CM | POA: Diagnosis not present

## 2016-10-25 LAB — HM COLONOSCOPY

## 2016-10-29 DIAGNOSIS — K6389 Other specified diseases of intestine: Secondary | ICD-10-CM | POA: Diagnosis not present

## 2016-10-29 DIAGNOSIS — D126 Benign neoplasm of colon, unspecified: Secondary | ICD-10-CM | POA: Diagnosis not present

## 2016-10-29 DIAGNOSIS — Z1211 Encounter for screening for malignant neoplasm of colon: Secondary | ICD-10-CM | POA: Diagnosis not present

## 2017-01-13 ENCOUNTER — Other Ambulatory Visit: Payer: Self-pay | Admitting: Family Medicine

## 2017-01-13 NOTE — Telephone Encounter (Signed)
Patient called stating she needs a refill on her cholesterol medication, she has only one left. Patient scheduled an appointment to see Dr Carlota Raspberry Thursday. Please advise

## 2017-01-16 ENCOUNTER — Ambulatory Visit (INDEPENDENT_AMBULATORY_CARE_PROVIDER_SITE_OTHER): Payer: PPO | Admitting: Family Medicine

## 2017-01-16 ENCOUNTER — Encounter: Payer: Self-pay | Admitting: Family Medicine

## 2017-01-16 VITALS — BP 147/79 | HR 78 | Temp 98.5°F | Resp 16 | Ht 64.0 in | Wt 237.0 lb

## 2017-01-16 DIAGNOSIS — Z114 Encounter for screening for human immunodeficiency virus [HIV]: Secondary | ICD-10-CM

## 2017-01-16 DIAGNOSIS — J309 Allergic rhinitis, unspecified: Secondary | ICD-10-CM

## 2017-01-16 DIAGNOSIS — Z0000 Encounter for general adult medical examination without abnormal findings: Secondary | ICD-10-CM

## 2017-01-16 DIAGNOSIS — Z1159 Encounter for screening for other viral diseases: Secondary | ICD-10-CM | POA: Diagnosis not present

## 2017-01-16 DIAGNOSIS — M792 Neuralgia and neuritis, unspecified: Secondary | ICD-10-CM

## 2017-01-16 DIAGNOSIS — K219 Gastro-esophageal reflux disease without esophagitis: Secondary | ICD-10-CM

## 2017-01-16 DIAGNOSIS — E785 Hyperlipidemia, unspecified: Secondary | ICD-10-CM | POA: Diagnosis not present

## 2017-01-16 DIAGNOSIS — E039 Hypothyroidism, unspecified: Secondary | ICD-10-CM

## 2017-01-16 DIAGNOSIS — Z23 Encounter for immunization: Secondary | ICD-10-CM | POA: Diagnosis not present

## 2017-01-16 MED ORDER — ZOSTER VACCINE LIVE 19400 UNT/0.65ML ~~LOC~~ SUSR: 0.6500 mL | Freq: Once | SUBCUTANEOUS | 0 refills | Status: AC

## 2017-01-16 MED ORDER — ROSUVASTATIN CALCIUM 5 MG PO TABS: 5.0000 mg | ORAL_TABLET | Freq: Every day | ORAL | 1 refills | Status: DC

## 2017-01-16 MED ORDER — PANTOPRAZOLE SODIUM 40 MG PO TBEC: 40.0000 mg | DELAYED_RELEASE_TABLET | Freq: Every day | ORAL | 3 refills | Status: DC

## 2017-01-16 MED ORDER — GABAPENTIN 600 MG PO TABS: 600.0000 mg | ORAL_TABLET | Freq: Three times a day (TID) | ORAL | 0 refills | Status: DC

## 2017-01-16 MED ORDER — SYNTHROID 100 MCG PO TABS: 100.0000 ug | ORAL_TABLET | Freq: Every day | ORAL | 1 refills | Status: DC

## 2017-01-16 MED ORDER — FLUTICASONE PROPIONATE 50 MCG/ACT NA SUSP: NASAL | 11 refills | Status: DC

## 2017-01-16 NOTE — Patient Instructions (Addendum)
Exercise most days per week with minimum of 150 minutes per week.   meds refilled, but follow up with me in next few months to discuss nerve pain in leg further.   Zantac over the counter may suffice for heartburn, but ok to take pantoprozole if needed.   Return to the clinic or go to the nearest emergency room if any of your symptoms worsen or new symptoms occur.  Food Choices for Gastroesophageal Reflux Disease, Adult When you have gastroesophageal reflux disease (GERD), the foods you eat and your eating habits are very important. Choosing the right foods can help ease the discomfort of GERD. What general guidelines do I need to follow?  Choose fruits, vegetables, whole grains, low-fat dairy products, and low-fat meat, fish, and poultry.  Limit fats such as oils, salad dressings, butter, nuts, and avocado.  Keep a food diary to identify foods that cause symptoms.  Avoid foods that cause reflux. These may be different for different people.  Eat frequent small meals instead of three large meals each day.  Eat your meals slowly, in a relaxed setting.  Limit fried foods.  Cook foods using methods other than frying.  Avoid drinking alcohol.  Avoid drinking large amounts of liquids with your meals.  Avoid bending over or lying down until 2-3 hours after eating. What foods are not recommended? The following are some foods and drinks that may worsen your symptoms: Vegetables  Tomatoes. Tomato juice. Tomato and spaghetti sauce. Chili peppers. Onion and garlic. Horseradish. Fruits  Oranges, grapefruit, and lemon (fruit and juice). Meats  High-fat meats, fish, and poultry. This includes hot dogs, ribs, ham, sausage, salami, and bacon. Dairy  Whole milk and chocolate milk. Sour cream. Cream. Butter. Ice cream. Cream cheese. Beverages  Coffee and tea, with or without caffeine. Carbonated beverages or energy drinks. Condiments  Hot sauce. Barbecue sauce. Sweets/Desserts   Chocolate and cocoa. Donuts. Peppermint and spearmint. Fats and Oils  High-fat foods, including [place] fries and potato chips. Other  Vinegar. Strong spices, such as black pepper, white pepper, red pepper, cayenne, curry powder, cloves, ginger, and chili powder. The items listed above may not be a complete list of foods and beverages to avoid. Contact your dietitian for more information.  This information is not intended to replace advice given to you by your health care provider. Make sure you discuss any questions you have with your health care provider. Document Released: 12/09/2005 Document Revised: 05/16/2016 Document Reviewed: 10/13/2013 Elsevier Interactive Patient Education  2017 [place] [employer]  Get These Tests  Blood Pressure- Have your blood pressure checked by your healthcare provider at least once a year.  Normal blood pressure is 120/80.  Weight- Have your body mass index (BMI) calculated to screen for obesity.  BMI is a measure of body fat based on height and weight.  You can calculate your own BMI at [employer]  Cholesterol- Have your cholesterol checked every year.  Diabetes- Have your blood sugar checked every year if you have high blood pressure, high cholesterol, a family history of diabetes or if you are overweight.  Pap Test - Have a pap test every 1 to 5 years if you have been sexually active.  If you are older than 65 and recent pap tests have been normal you may not need additional pap tests.  In addition, if you have had a hysterectomy  for benign disease additional pap tests are not necessary.  Mammogram-Yearly mammograms are essential for early detection  of breast cancer  Screening for Colon Cancer- Colonoscopy starting at age 38. Screening may begin sooner depending on your family history and other health conditions.  Follow up colonoscopy as directed by your Gastroenterologist.  Screening for Osteoporosis- Screening begins  at age 87 with bone density scanning, sooner if you are at higher risk for developing Osteoporosis.  Get these medicines  Calcium with Vitamin D- Your body requires 1200-1500 mg of Calcium a day and [phone] IU of Vitamin D a day.  You can only absorb 500 mg of Calcium at a time therefore Calcium must be taken in 2 or 3 separate doses throughout the day.  Hormones- Hormone therapy has been associated with increased risk for certain cancers and heart disease.  Talk to your healthcare provider about if you need relief from menopausal symptoms.  Aspirin- Ask your healthcare provider about taking Aspirin to prevent Heart Disease and Stroke.  Get these Immuniztions  Flu shot- Every fall  Pneumonia shot- Once after the age of 82; if you are younger ask your healthcare provider if you need a pneumonia shot.  Tetanus- Every ten years.  Zostavax- Once after the age of 21 to prevent shingles.  Take these steps  Don't smoke- Your healthcare provider can help you quit. For tips on how to quit, ask your healthcare provider or go to www.smokefree.gov or call 1-800 QUIT-NOW.  Be physically active- Exercise 5 days a week for a minimum of 30 minutes.  If you are not already physically active, start slow and gradually work up to 30 minutes of moderate physical activity.  Try walking, dancing, bike riding, swimming, etc.  Eat a healthy diet- Eat a variety of healthy foods such as fruits, vegetables, whole grains, low fat milk, low fat cheeses, yogurt, lean meats, chicken, fish, eggs, dried beans, tofu, etc.  For more information go to www.thenutritionsource.org  Dental visit- Brush and floss teeth twice daily; visit your dentist twice a year.  Eye exam- Visit your Optometrist or Ophthalmologist yearly.  Drink alcohol in moderation- Limit alcohol intake to one drink or less a day.  Never drink and drive.  Depression- Your emotional health is as important as your physical health.  If you're feeling down  or losing interest in things you normally enjoy, please talk to your healthcare provider.  Seat Belts- can save your life; always wear one  Smoke/Carbon Monoxide detectors- These detectors need to be installed on the appropriate level of your home.  Replace batteries at least once a year.  Violence- If anyone is threatening or hurting you, please tell your healthcare provider.  Living Will/ Health care power of attorney- Discuss with your healthcare provider and family.   IF you received an x-ray today, you will receive an invoice from [facility]. Please contact [facility] at [phone] with questions or concerns regarding your invoice.   IF you received labwork today, you will receive an invoice from [place]. Please contact [facility] at [phone] with questions or concerns regarding your invoice.   Our billing staff will not be able to assist you with questions regarding bills from these companies.  You will be contacted with the lab results as soon as they are available. The fastest way to get your results is to activate your My Chart account. Instructions are located on the last page of this paperwork. If you have not heard from [place] regarding the results in 2 weeks, please contact this office.

## 2017-01-16 NOTE — Progress Notes (Signed)
By signing my name below, I, [name], attest that this documentation has been prepared under the direction and in the presence of [name], MD.  Electronically Signed: [name], Medical Scribe. 01/16/17. 5:49 PM.  Subjective:    Patient ID: Veronica Martinez, female    DOB: 08-24-1956, 61 y.o.   MRN: [id number]  HPI Chief Complaint  Patient presents with  . Annual Exam    no pap    HPI Comments: Veronica Martinez is a 61 y.o. female who presents to the [facility] for her complete physical. Pt would like to get a refill on all of her medications. Pt is fasting.  Cancer Screening: Breast CA: Mammogram is scheduled at the end of Feb 2018 with her last Jan 2016. She was told not to worry about the benign calcifications found in her mammogram in 2016. Colon CA: Pt reportedly got her colonoscopy by Dr. [name] in Nov 2017 - no concerns. Cervical CA: Has a scheduled appt with Dr. [name]  Immunizations: Pt has not had her shingles vaccine. Immunization History  Administered Date(s) Administered  . Influenza Split 09/01/2012  . Influenza,inj,Quad PF,36+ Mos 09/07/2013, 01/10/2015, 12/26/2015  . Tdap 01/10/2015   Vision: Pt has seen her ophthalmologist Sept 2017 without any concerning findings. She has another appt in Dec.  Visual Acuity Screening   Right eye Left eye Both eyes  Without correction: 20/30 20/30 20/30   With correction:      Dentist: Pt is followed by a dentist.  Exercise: Pt exercises a couple of days a week.  Depression Screening: Depression screen [facility] 2/9 01/16/2017 07/06/2016 06/27/2016 06/04/2016 06/01/2016  Decreased Interest 0 0 0 0 0  Down, Depressed, Hopeless 0 0 0 0 0  PHQ - 2 Score 0 0 0 0 0   HIV/Hep C Screening: Pt has not had a Hep C or HIV screening, but would like to get both done today.  Hypothyroidism: Takes synthroid 100 mcg QD. Was followed by endocrinologist Dr. Chalmers Cater in the past, but since it was stable she was  told her PCP could manage her medication.  HLD: Takes crestor 5 mg. Denies myalgias or other negative side effects from this medication.  Lab Results  Component Value Date   CHOL 189 06/27/2016   HDL 46 06/27/2016   LDLCALC 114 06/27/2016   TRIG 146 06/27/2016   CHOLHDL 4.1 06/27/2016   Lab Results  Component Value Date   ALT 10 06/27/2016   AST 12 06/27/2016   ALKPHOS 57 06/27/2016   BILITOT 0.6 06/27/2016   Nerve Pain: Gabapentin TID for severe nerve pain in the right leg and charlie horses. She was advised to stop wearing tight clothing. Denies light-headedness, dizziness, or other side effects.   Seasonal Allergies: Takes flonase 2x in each nostril.  GERD: Takes protonix when 2x a week. Her triggers are coffee.  Patient Active Problem List   Diagnosis Date Noted  . 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 Right hip pain 08/09/2013  . Trigger finger, acquired 04/22/2013  . Carpal tunnel syndrome of right wrist 04/22/2013  . Pain of right heel 12/08/2012  . Myalgia 11/10/2012  . OBESITY, UNSPECIFIED 02/20/2011  . BACK PAIN, LUMBAR 07/05/2010  . ABNORMALITY OF GAIT 05/02/2010  . MERALGIA PARESTHETICA 03/02/2010  . HIP PAIN, BILATERAL 03/02/2010   Past Medical History:  Diagnosis Date  . Allergy   . GERD (gastroesophageal reflux disease)   .  Headache(784.0)    sinus  . High cholesterol   . History of Graves' disease   . Hx of radioactive iodine thyroid ablation   . Hypothyroidism    Past Surgical History:  Procedure Laterality Date  . ABDOMINAL HYSTERECTOMY    . APPENDECTOMY    . CATARACT EXTRACTION W/PHACO Right 09/22/2013   Procedure: CATARACT EXTRACTION PHACO AND INTRAOCULAR LENS PLACEMENT (IOC);  Surgeon: [name], MD;  Location: [facility];  Service: Ophthalmology;  Laterality: Right;  . CATARACT EXTRACTION W/PHACO Left 12/29/2013   Procedure: CATARACT EXTRACTION PHACO AND INTRAOCULAR LENS PLACEMENT  (IOC);  Surgeon: [name], MD;  Location: [facility];  Service: Ophthalmology;  Laterality: Left;  . COLONOSCOPY    . EYE SURGERY    . HIP ARTHROPLASTY Bilateral   . JOINT REPLACEMENT    . OTHER SURGICAL HISTORY Right    carpal tunel injection  . SHOULDER SURGERY    . TONSILLECTOMY     Allergies  Allergen Reactions  . Meloxicam Other (See Comments)    Causes Bruising   . Pravastatin Sodium Other (See Comments)    Bruises  . Simvastatin Other (See Comments)    Bruises   . Tramadol Hcl Other (See Comments)    Bruising    Prior to Admission medications   Medication Sig Start Date End Date Taking? Authorizing Provider  cefdinir (OMNICEF) 300 MG capsule Take 1 capsule (300 mg total) by mouth 2 (two) times daily. 07/06/16   Gregor Hams, MD  Cholecalciferol (VITAMIN D) 2000 UNITS CAPS Take 2,000 Units by mouth daily.     Historical Provider, MD  Coenzyme Q10 (CO Q 10 PO) Take 75 mg by mouth 3 (three) times daily.    Historical Provider, MD  cyanocobalamin 2000 MCG tablet Take 2,000 mcg by mouth daily. Vitamin B12    Historical Provider, MD  fluticasone (FLONASE) 50 MCG/ACT nasal spray TAKE 2 PUFFS EACH NOSTRIL EVERY DAY 03/19/16   Robyn Haber, MD  gabapentin (NEURONTIN) 600 MG tablet TAKE ONE TABLET BY MOUTH THREE TIMES DAILY   NEEDS  NEW  APPOINTMENT  FOR  FURTHER  REFILLS 10/15/16   Stefanie Libel, MD  Guaifenesin [facility] MAXIMUM STRENGTH) 1200 MG TB12 Take 1 tablet (1,200 mg total) by mouth every 12 (twelve) hours as needed. 05/10/16   Dorian Heckle English, PA  ibuprofen (ADVIL,MOTRIN) 600 MG tablet TAKE 1 TABLET BY MOUTH EVERY 6 TO 8 HOURS AS NEEDED FOR PAIN 03/25/16   Historical Provider, MD  Misc Natural Products (CALCIUM PLUS ADVANCED PO) Take 600 mg by mouth 2 (two) times daily.     Historical Provider, MD  Multiple Vitamins-Minerals (CENTRUM SILVER PO) Take by mouth.    Historical Provider, MD  Omega-3 Fatty Acids (FISH OIL) 1200 MG CAPS Take 1,200 mg by mouth 3 (three) times daily.      Historical Provider, MD  pantoprazole (PROTONIX) 40 MG tablet Take 40 mg by mouth daily.     Historical Provider, MD  rosuvastatin (CRESTOR) 5 MG tablet TAKE ONE TABLET BY MOUTH ONCE DAILY 01/13/17   Wendie Agreste, MD  SYNTHROID 100 MCG tablet Take 100 mcg by mouth daily. 07/15/15   Historical Provider, MD  vitamin E 400 UNIT capsule Take 400 Units by mouth daily.    Historical Provider, MD  zoster vaccine live, PF, (ZOSTAVAX) 91478 UNT/0.65ML injection Inject 19,400 Units into the skin once. 12/26/15   Darlyne Russian, MD   Social History   Social History  . Marital status:  Married    Spouse name: N/A  . Number of children: N/A  . Years of education: N/A   Occupational History  . Not on file.   Social History Main Topics  . Smoking status: Never Smoker  . Smokeless tobacco: Never Used  . Alcohol use No  . Drug use: No  . Sexual activity: Yes    Partners: Male   Other Topics Concern  . Not on file   Social History Narrative   Married. Education: [employer].    Review of Systems  HENT: Positive for postnasal drip.   13 point ROS is positive for the above, otherwise negative.  Objective:  Physical Exam  Constitutional: She is oriented to person, place, and time. She appears well-developed and well-nourished.  HENT:  Head: Normocephalic and atraumatic.  Right Ear: External ear normal.  Left Ear: External ear normal.  Mouth/Throat: Oropharynx is clear and moist.  Eyes: Conjunctivae are normal. Pupils are equal, round, and reactive to light.  Neck: Normal range of motion. Neck supple. No thyromegaly present.  Cardiovascular: Normal rate, regular rhythm, normal heart sounds and intact distal pulses.   No murmur heard. Pulmonary/Chest: Effort normal and breath sounds normal. No respiratory distress. She has no wheezes.  Abdominal: Soft. Bowel sounds are normal. There is no tenderness.  Musculoskeletal: Normal range of motion. She exhibits no edema or tenderness.    Lymphadenopathy:    She has no cervical adenopathy.  Neurological: She is alert and oriented to person, place, and time.  Skin: Skin is warm and dry. No rash noted.  Psychiatric: She has a normal mood and affect. Her behavior is normal. Thought content normal.   BP (!) 147/79   Pulse 78   Temp 98.5 F (36.9 C) (Oral)   Resp 16   Ht 5\' 4"  (1.626 m)   Wt 237 lb (107.5 kg)   SpO2 95%   BMI 40.68 kg/m  Assessment & Plan:   Veronica Martinez is a 61 y.o. female Annual physical exam  - -anticipatory guidance as below in AVS, screening labs above. Health maintenance items as above in HPI discussed/recommended as applicable.   Need for shingles vaccine - Plan: Zoster Vaccine Live, PF, (ZOSTAVAX) 16109 UNT/0.65ML injection  - zostavax rx printed to fill at pharmacy   Hyperlipidemia, unspecified hyperlipidemia type - Plan: Comprehensive metabolic panel, Lipid panel, rosuvastatin (CRESTOR) 5 MG tablet  - Continue Crestor same dose, labs pending.  Hypothyroidism, unspecified type - Plan: TSH  - Check TSH, continue Synthroid same dose.  Need for hepatitis C screening test - Plan: Hepatitis C antibody, SYNTHROID 100 MCG tablet  Nerve pain - Plan: gabapentin (NEURONTIN) 600 MG tablet  - By history and description appears to be meralgia paresthetica. Okay to continue Neurontin for now, but this should improve with avoidance of constrictive clothing. Follow-up to discuss this further.  Gastroesophageal reflux disease, esophagitis presence not specified - Plan: pantoprazole (PROTONIX) 40 MG tablet  - Trigger avoidance discussed, continue PPI daily when necessary.  Allergic rhinitis, unspecified chronicity, unspecified seasonality, unspecified trigger  - stable. Continue Flonase, trigger avoidance. Encounter for screening for HIV - Plan: HIV antibody   Meds ordered this encounter  Medications  . Zoster Vaccine Live, PF, (ZOSTAVAX) 60454 UNT/0.65ML injection    Sig: Inject 19,400 Units into  the skin once.    Dispense:  1 each    Refill:  0  . SYNTHROID 100 MCG tablet    Sig: Take 1 tablet (100 mcg total)  by mouth daily before breakfast.    Dispense:  90 tablet    Refill:  1  . rosuvastatin (CRESTOR) 5 MG tablet    Sig: Take 1 tablet (5 mg total) by mouth daily.    Dispense:  90 tablet    Refill:  1  . pantoprazole (PROTONIX) 40 MG tablet    Sig: Take 1 tablet (40 mg total) by mouth daily.    Dispense:  30 tablet    Refill:  3  . gabapentin (NEURONTIN) 600 MG tablet    Sig: Take 1 tablet (600 mg total) by mouth 3 (three) times daily.    Dispense:  270 tablet    Refill:  0    Please consider 90 day supplies to promote better adherence  . fluticasone (FLONASE) 50 MCG/ACT nasal spray    Sig: TAKE 2 PUFFS EACH NOSTRIL EVERY DAY    Dispense:  48 g    Refill:  11   Patient Instructions    Exercise most days per week with minimum of 150 minutes per week.   meds refilled, but follow up with me in next few months to discuss nerve pain in leg further.   Zantac over the counter may suffice for heartburn, but ok to take pantoprozole if needed.   Return to the clinic or go to the nearest emergency room if any of your symptoms worsen or new symptoms occur.  Food Choices for Gastroesophageal Reflux Disease, Adult When you have gastroesophageal reflux disease (GERD), the foods you eat and your eating habits are very important. Choosing the right foods can help ease the discomfort of GERD. What general guidelines do I need to follow?  Choose fruits, vegetables, whole grains, low-fat dairy products, and low-fat meat, fish, and poultry.  Limit fats such as oils, salad dressings, butter, nuts, and avocado.  Keep a food diary to identify foods that cause symptoms.  Avoid foods that cause reflux. These may be different for different people.  Eat frequent small meals instead of three large meals each day.  Eat your meals slowly, in a relaxed setting.  Limit fried  foods.  Cook foods using methods other than frying.  Avoid drinking alcohol.  Avoid drinking large amounts of liquids with your meals.  Avoid bending over or lying down until 2-3 hours after eating. What foods are not recommended? The following are some foods and drinks that may worsen your symptoms: Vegetables  Tomatoes. Tomato juice. Tomato and spaghetti sauce. Chili peppers. Onion and garlic. Horseradish. Fruits  Oranges, grapefruit, and lemon (fruit and juice). Meats  High-fat meats, fish, and poultry. This includes hot dogs, ribs, ham, sausage, salami, and bacon. Dairy  Whole milk and chocolate milk. Sour cream. Cream. Butter. Ice cream. Cream cheese. Beverages  Coffee and tea, with or without caffeine. Carbonated beverages or energy drinks. Condiments  Hot sauce. Barbecue sauce. Sweets/Desserts  Chocolate and cocoa. Donuts. Peppermint and spearmint. Fats and Oils  High-fat foods, including [place] fries and potato chips. Other  Vinegar. Strong spices, such as black pepper, white pepper, red pepper, cayenne, curry powder, cloves, ginger, and chili powder. The items listed above may not be a complete list of foods and beverages to avoid. Contact your dietitian for more information.  This information is not intended to replace advice given to you by your health care provider. Make sure you discuss any questions you have with your health care provider. Document Released: 12/09/2005 Document Revised: 05/16/2016 Document Reviewed: 10/13/2013 Elsevier Interactive Patient Education  2017 [place] [employer]  Get These Tests  Blood Pressure- Have your blood pressure checked by your healthcare provider at least once a year.  Normal blood pressure is 120/80.  Weight- Have your body mass index (BMI) calculated to screen for obesity.  BMI is a measure of body fat based on height and weight.  You can calculate your own BMI at [employer]  Cholesterol-  Have your cholesterol checked every year.  Diabetes- Have your blood sugar checked every year if you have high blood pressure, high cholesterol, a family history of diabetes or if you are overweight.  Pap Test - Have a pap test every 1 to 5 years if you have been sexually active.  If you are older than 65 and recent pap tests have been normal you may not need additional pap tests.  In addition, if you have had a hysterectomy  for benign disease additional pap tests are not necessary.  Mammogram-Yearly mammograms are essential for early detection of breast cancer  Screening for Colon Cancer- Colonoscopy starting at age 63. Screening may begin sooner depending on your family history and other health conditions.  Follow up colonoscopy as directed by your Gastroenterologist.  Screening for Osteoporosis- Screening begins at age 73 with bone density scanning, sooner if you are at higher risk for developing Osteoporosis.  Get these medicines  Calcium with Vitamin D- Your body requires 1200-1500 mg of Calcium a day and [phone] IU of Vitamin D a day.  You can only absorb 500 mg of Calcium at a time therefore Calcium must be taken in 2 or 3 separate doses throughout the day.  Hormones- Hormone therapy has been associated with increased risk for certain cancers and heart disease.  Talk to your healthcare provider about if you need relief from menopausal symptoms.  Aspirin- Ask your healthcare provider about taking Aspirin to prevent Heart Disease and Stroke.  Get these Immuniztions  Flu shot- Every fall  Pneumonia shot- Once after the age of 16; if you are younger ask your healthcare provider if you need a pneumonia shot.  Tetanus- Every ten years.  Zostavax- Once after the age of 67 to prevent shingles.  Take these steps  Don't smoke- Your healthcare provider can help you quit. For tips on how to quit, ask your healthcare provider or go to www.smokefree.gov or call 1-800 QUIT-NOW.  Be  physically active- Exercise 5 days a week for a minimum of 30 minutes.  If you are not already physically active, start slow and gradually work up to 30 minutes of moderate physical activity.  Try walking, dancing, bike riding, swimming, etc.  Eat a healthy diet- Eat a variety of healthy foods such as fruits, vegetables, whole grains, low fat milk, low fat cheeses, yogurt, lean meats, chicken, fish, eggs, dried beans, tofu, etc.  For more information go to www.thenutritionsource.org  Dental visit- Brush and floss teeth twice daily; visit your dentist twice a year.  Eye exam- Visit your Optometrist or Ophthalmologist yearly.  Drink alcohol in moderation- Limit alcohol intake to one drink or less a day.  Never drink and drive.  Depression- Your emotional health is as important as your physical health.  If you're feeling down or losing interest in things you normally enjoy, please talk to your healthcare provider.  Seat Belts- can save your life; always wear one  Smoke/Carbon Monoxide detectors- These detectors need to be installed on the appropriate level of your home.  Replace batteries at least once  a year.  Violence- If anyone is threatening or hurting you, please tell your healthcare provider.  Living Will/ Health care power of attorney- Discuss with your healthcare provider and family.   IF you received an x-ray today, you will receive an invoice from [facility]. Please contact [facility] at [phone] with questions or concerns regarding your invoice.   IF you received labwork today, you will receive an invoice from [facility]. Please contact [facility] at [phone] with questions or concerns regarding your invoice.   Our billing staff will not be able to assist you with questions regarding bills from these companies.  You will be contacted with the lab results as soon as they are available. The fastest way to get your results is to activate your My Chart account.  Instructions are located on the last page of this paperwork. If you have not heard from [place] regarding the results in 2 weeks, please contact this office.       I personally performed the services described in this documentation, which was scribed in my presence. The recorded information has been reviewed and considered, and addended by me as needed.   Signed,   [name], MD [facility].  01/19/17 3:23 PM

## 2017-01-18 LAB — LIPID PANEL
Chol/HDL Ratio: 1.7 ratio units (ref 0.0–4.4)
Cholesterol, Total: 188 mg/dL (ref 100–199)
HDL: 108 mg/dL (ref >39)
LDL Calculated: 69 mg/dL (ref 0–99)
Triglycerides: 55 mg/dL (ref 0–149)
VLDL Cholesterol Cal: 11 mg/dL (ref 5–40)

## 2017-01-18 LAB — COMPREHENSIVE METABOLIC PANEL
ALT: 21 IU/L (ref 0–32)
AST: 21 IU/L (ref 0–40)
Albumin/Globulin Ratio: 1.5 (ref 1.2–2.2)
Albumin: 4.7 g/dL (ref 3.6–4.8)
Alkaline Phosphatase: 86 IU/L (ref 39–117)
BUN/Creatinine Ratio: 12 (ref 12–28)
BUN: 10 mg/dL (ref 8–27)
Bilirubin Total: 0.5 mg/dL (ref 0.0–1.2)
CO2: 25 mmol/L (ref 18–29)
Calcium: 10.3 mg/dL (ref 8.7–10.3)
Chloride: 100 mmol/L (ref 96–106)
Creatinine, Ser: 0.82 mg/dL (ref 0.57–1.00)
GFR calc Af Amer: 89 mL/min/{1.73_m2} (ref >59)
GFR calc non Af Amer: 77 mL/min/{1.73_m2} (ref >59)
Globulin, Total: 3.2 g/dL (ref 1.5–4.5)
Glucose: 87 mg/dL (ref 65–99)
Potassium: 4.6 mmol/L (ref 3.5–5.2)
Sodium: 142 mmol/L (ref 134–144)
Total Protein: 7.9 g/dL (ref 6.0–8.5)

## 2017-01-18 LAB — HIV ANTIBODY (ROUTINE TESTING W REFLEX): HIV Screen 4th Generation wRfx: NONREACTIVE

## 2017-01-18 LAB — HEPATITIS C ANTIBODY: Hep C Virus Ab: 0.2 s/co ratio (ref 0.0–0.9)

## 2017-01-18 LAB — TSH: TSH: 2.08 u[IU]/mL (ref 0.450–4.500)

## 2017-01-27 ENCOUNTER — Encounter: Payer: Self-pay | Admitting: *Deleted

## 2017-02-10 ENCOUNTER — Ambulatory Visit: Payer: PPO

## 2017-02-17 DIAGNOSIS — Z01419 Encounter for gynecological examination (general) (routine) without abnormal findings: Secondary | ICD-10-CM | POA: Diagnosis not present

## 2017-02-17 DIAGNOSIS — N952 Postmenopausal atrophic vaginitis: Secondary | ICD-10-CM | POA: Diagnosis not present

## 2017-02-17 DIAGNOSIS — N941 Unspecified dyspareunia: Secondary | ICD-10-CM | POA: Diagnosis not present

## 2017-02-17 DIAGNOSIS — R921 Mammographic calcification found on diagnostic imaging of breast: Secondary | ICD-10-CM | POA: Diagnosis not present

## 2017-02-17 DIAGNOSIS — Z6841 Body Mass Index (BMI) 40.0 and over, adult: Secondary | ICD-10-CM | POA: Diagnosis not present

## 2017-02-20 ENCOUNTER — Other Ambulatory Visit: Payer: Self-pay | Admitting: Obstetrics and Gynecology

## 2017-02-20 DIAGNOSIS — R921 Mammographic calcification found on diagnostic imaging of breast: Secondary | ICD-10-CM

## 2017-03-11 ENCOUNTER — Other Ambulatory Visit: Payer: Self-pay | Admitting: Family Medicine

## 2017-03-13 ENCOUNTER — Ambulatory Visit
Admission: RE | Admit: 2017-03-13 | Discharge: 2017-03-13 | Disposition: A | Discharge status: Home / Self Care | Payer: PPO | Source: Ambulatory Visit | Attending: Obstetrics and Gynecology | Admitting: Obstetrics and Gynecology

## 2017-03-13 DIAGNOSIS — R921 Mammographic calcification found on diagnostic imaging of breast: Secondary | ICD-10-CM

## 2017-04-15 ENCOUNTER — Telehealth: Payer: Self-pay | Admitting: General Practice

## 2017-04-15 NOTE — Telephone Encounter (Signed)
[facility] has [facility] twice for crestor to see about changing from brand name or generic   Best number to [place] is [phone]

## 2017-04-16 NOTE — Telephone Encounter (Signed)
Ok to change to generic? Pharmacy is asking

## 2017-04-17 NOTE — Telephone Encounter (Signed)
I am fine with substitution, but not simvastatin or pravastatin as those are on allergy list.

## 2017-04-17 NOTE — Telephone Encounter (Signed)
Verbal ok for generic crestor given to pharmacy

## 2017-04-30 ENCOUNTER — Telehealth: Payer: Self-pay

## 2017-04-30 NOTE — Telephone Encounter (Signed)
Received a denial on crestor.  I called medicare and they said they will approve rosuvastatin 5mg .  This is the new generic for crestor.  Will this work for patient or would you like to try something else?  Please advise.

## 2017-04-30 NOTE — Telephone Encounter (Signed)
Yes - ok to supplement with generic.

## 2017-05-01 ENCOUNTER — Telehealth: Payer: Self-pay | Admitting: *Deleted

## 2017-05-01 NOTE — Telephone Encounter (Signed)
Called patient left message in voice mail advising her since Medicare denied brand name Crestor, I called [facility] on [address] and they have the generic rosuvastatin.

## 2017-05-02 MED ORDER — ROSUVASTATIN CALCIUM 5 MG PO TABS: 5.0000 mg | ORAL_TABLET | Freq: Every day | ORAL | 0 refills | Status: DC

## 2017-05-02 NOTE — Telephone Encounter (Signed)
Sent to [facility]

## 2017-05-05 NOTE — Telephone Encounter (Signed)
Dr. Carlota Raspberry sent in generic to [facility] per Santiago Glad.

## 2017-05-10 ENCOUNTER — Encounter: Payer: Self-pay | Admitting: Family Medicine

## 2017-05-10 ENCOUNTER — Ambulatory Visit (INDEPENDENT_AMBULATORY_CARE_PROVIDER_SITE_OTHER): Payer: PPO | Admitting: Family Medicine

## 2017-05-10 VITALS — BP 137/73 | HR 72 | Temp 98.8°F | Resp 20 | Ht 64.5 in | Wt 240.1 lb

## 2017-05-10 DIAGNOSIS — K219 Gastro-esophageal reflux disease without esophagitis: Secondary | ICD-10-CM

## 2017-05-10 DIAGNOSIS — R1013 Epigastric pain: Secondary | ICD-10-CM | POA: Diagnosis not present

## 2017-05-10 DIAGNOSIS — R110 Nausea: Secondary | ICD-10-CM | POA: Diagnosis not present

## 2017-05-10 MED ORDER — SUCRALFATE 1 GM/10ML PO SUSP: 1.0000 g | Freq: Three times a day (TID) | ORAL | 0 refills | Status: DC

## 2017-05-10 NOTE — Patient Instructions (Addendum)
Restart Protonix once per day. Can also take Carafate up to 4 times per day (with meals and at bedtime). If abdominal pain is not resolved in the next 2-3 days, return for recheck and to discuss other workup. Once pain improves next week, can return to just Protonix once per day if needed. Return sooner if worsening.   Abdominal Pain, Adult Abdominal pain can be caused by many things. Often, abdominal pain is not serious and it gets better with no treatment or by being treated at home. However, sometimes abdominal pain is serious. Your health care provider will do a medical history and a physical exam to try to determine the cause of your abdominal pain. Follow these instructions at home:  Take over-the-counter and prescription medicines only as told by your health care provider. Do not take a laxative unless told by your health care provider.  Drink enough fluid to keep your urine clear or pale yellow.  Watch your condition for any changes.  Keep all follow-up visits as told by your health care provider. This is important. Contact a health care provider if:  Your abdominal pain changes or gets worse.  You are not hungry or you lose weight without trying.  You are constipated or have diarrhea for more than 2-3 days.  You have pain when you urinate or have a bowel movement.  Your abdominal pain wakes you up at night.  Your pain gets worse with meals, after eating, or with certain foods.  You are throwing up and cannot keep anything down.  You have a fever. Get help right away if:  Your pain does not go away as soon as your health care provider told you to expect.  You cannot stop throwing up.  Your pain is only in areas of the abdomen, such as the right side or the left lower portion of the abdomen.  You have bloody or black stools, or stools that look like tar.  You have severe pain, cramping, or bloating in your abdomen.  You have signs of dehydration, such as:  Dark  urine, very little urine, or no urine.  Cracked lips.  Dry mouth.  Sunken eyes.  Sleepiness.  Weakness. This information is not intended to replace advice given to you by your health care provider. Make sure you discuss any questions you have with your health care provider. Document Released: 09/18/2005 Document Revised: 06/28/2016 Document Reviewed: 05/22/2016 Elsevier Interactive Patient Education  2017 [employer].     IF you received an x-ray today, you will receive an invoice from [facility]. Please contact [facility] at [phone] with questions or concerns regarding your invoice.   IF you received labwork today, you will receive an invoice from [facility]. Please contact [facility] at [phone] with questions or concerns regarding your invoice.   Our billing staff will not be able to assist you with questions regarding bills from these companies.  You will be contacted with the lab results as soon as they are available. The fastest way to get your results is to activate your My Chart account. Instructions are located on the last page of this paperwork. If you have not heard from [place] regarding the results in 2 weeks, please contact this office.

## 2017-05-10 NOTE — Progress Notes (Signed)
Subjective:  By signing my name below, I, Veronica Martinez, attest that this documentation has been prepared under the direction and in the presence of [name], MD. Electronically Signed: [name], [place]. 05/10/2017 , 4:46 PM .  Patient was seen in Room 2 .   Patient ID: Veronica Martinez, female    DOB: 1956-08-29, 61 y.o.   MRN: [id number] Chief Complaint  Patient presents with  . Abdominal Pain    Burning x 1 week   HPI Veronica Martinez is a 61 y.o. female Here for burning upper abdominal pain with nausea ongoing for a week now. Patient states she's had similar abdominal pain in the past. She takes Protonix PRN for heartburn.   She informs eating a steak a week ago, when the symptoms started. She notes this isn't something that she usually eats. She's been mostly eating soup and salads, and less greasy foods. She took a Protonix when it first started and it eased off a little. She stopped taking it since and her symptoms returned. She denies fever, chest pain, shortness of breath, cough, dark or tarry stools, or vomiting. She denies history of abdominal surgeries. She denies history of smoking or alcohol use. She has been drinking more coffee lately. She has history of peptic ulcers when she was 61 years old, denies bleeding.   She mentions having some pain into her lower back.   Patient Active Problem List   Diagnosis Date Noted  . 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 Right hip pain 08/09/2013  . Trigger finger, acquired 04/22/2013  . Carpal tunnel syndrome of right wrist 04/22/2013  . Pain of right heel 12/08/2012  . Myalgia 11/10/2012  . OBESITY, UNSPECIFIED 02/20/2011  . BACK PAIN, LUMBAR 07/05/2010  . ABNORMALITY OF GAIT 05/02/2010  . MERALGIA PARESTHETICA 03/02/2010  . HIP PAIN, BILATERAL 03/02/2010   Past Medical History:  Diagnosis Date  . Allergy   . GERD (gastroesophageal reflux disease)   .  Headache(784.0)    sinus  . High cholesterol   . History of Graves' disease   . Hx of radioactive iodine thyroid ablation   . Hypothyroidism    Past Surgical History:  Procedure Laterality Date  . ABDOMINAL HYSTERECTOMY    . APPENDECTOMY    . CATARACT EXTRACTION W/PHACO Right 09/22/2013   Procedure: CATARACT EXTRACTION PHACO AND INTRAOCULAR LENS PLACEMENT (IOC);  Surgeon: [name], MD;  Location: [place];  Service: Ophthalmology;  Laterality: Right;  . CATARACT EXTRACTION W/PHACO Left 12/29/2013   Procedure: CATARACT EXTRACTION PHACO AND INTRAOCULAR LENS PLACEMENT (IOC);  Surgeon: [name], MD;  Location: [place];  Service: Ophthalmology;  Laterality: Left;  . COLONOSCOPY    . EYE SURGERY    . HIP ARTHROPLASTY Bilateral   . JOINT REPLACEMENT    . OTHER SURGICAL HISTORY Right    carpal tunel injection  . SHOULDER SURGERY    . TONSILLECTOMY     Allergies  Allergen Reactions  . Meloxicam Other (See Comments)    Causes Bruising   . Pravastatin Sodium Other (See Comments)    Bruises  . Simvastatin Other (See Comments)    Bruises   . Tramadol Hcl Other (See Comments)    Bruising    Prior to Admission medications   Medication Sig Start Date End Date Taking? Authorizing Provider  Cholecalciferol (VITAMIN D) 2000 UNITS CAPS Take 2,000 Units by mouth daily.     [provider]  Coenzyme Q10 (CO Q 10 PO) Take 75 mg by mouth 3 (three) times daily.    [provider]  cyanocobalamin 2000 MCG tablet Take 2,000 mcg by mouth daily. Vitamin B12    [provider]  fluticasone (FLONASE) 50 MCG/ACT nasal spray TAKE 2 PUFFS EACH NOSTRIL EVERY DAY 03/12/17   Tereasa Coop, PA-C  gabapentin (NEURONTIN) 600 MG tablet Take 1 tablet (600 mg total) by mouth 3 (three) times daily. 01/16/17   Wendie Agreste, MD  Guaifenesin [facility] MAXIMUM STRENGTH) 1200 MG TB12 Take 1 tablet (1,200 mg total) by mouth every 12 (twelve) hours as needed. Patient not taking: Reported  on 01/16/2017 05/10/16   Ivar Drape D, PA  ibuprofen (ADVIL,MOTRIN) 600 MG tablet TAKE 1 TABLET BY MOUTH EVERY 6 TO 8 HOURS AS NEEDED FOR PAIN 03/25/16   [provider]  Misc Natural Products (CALCIUM PLUS ADVANCED PO) Take 600 mg by mouth 2 (two) times daily.     [provider]  Multiple Vitamins-Minerals (CENTRUM SILVER PO) Take by mouth.    [provider]  Omega-3 Fatty Acids (FISH OIL) 1200 MG CAPS Take 1,200 mg by mouth 3 (three) times daily.     [provider]  pantoprazole (PROTONIX) 40 MG tablet Take 1 tablet (40 mg total) by mouth daily. 01/16/17   Wendie Agreste, MD  rosuvastatin (CRESTOR) 5 MG tablet Take 1 tablet (5 mg total) by mouth daily at 6 PM. 05/02/17   Wendie Agreste, MD  SYNTHROID 100 MCG tablet Take 1 tablet (100 mcg total) by mouth daily before breakfast. 01/16/17   Wendie Agreste, MD  vitamin E 400 UNIT capsule Take 400 Units by mouth daily.    [provider]   Social History   Social History  . Marital status: Married    Spouse name: N/A  . Number of children: N/A  . Years of education: N/A   Occupational History  . Not on file.   Social History Main Topics  . Smoking status: Never Smoker  . Smokeless tobacco: Never Used  . Alcohol use No  . Drug use: No  . Sexual activity: Yes    Partners: Male   Other Topics Concern  . Not on file   Social History Narrative   Married. Education: [employer].    Review of Systems  Constitutional: Negative for chills, fatigue and fever.  Respiratory: Negative for cough, chest tightness and shortness of breath.   Cardiovascular: Negative for chest pain.  Gastrointestinal: Positive for abdominal pain and nausea. Negative for Martinez in stool, diarrhea and vomiting.  Musculoskeletal: Positive for back pain.       Objective:   Physical Exam  Constitutional: She is oriented to person, place, and time. She appears well-developed and well-nourished. No distress.    HENT:  Head: Normocephalic and atraumatic.  Eyes: EOM are normal. Pupils are equal, round, and reactive to light.  Neck: Neck supple.  Cardiovascular: Normal rate, regular rhythm and normal heart sounds.   No murmur heard. Pulmonary/Chest: Effort normal and breath sounds normal. No respiratory distress. She has no wheezes.  Abdominal: There is tenderness in the epigastric area. There is no tenderness at McBurney's point and negative Murphy's sign.  Musculoskeletal: Normal range of motion.  Neurological: She is alert and oriented to person, place, and time.  Skin: Skin is warm and dry.  Psychiatric: She has a normal mood and affect. Her behavior is normal.  Nursing note and  vitals reviewed.   Vitals:   05/10/17 1549  BP: 137/73  Pulse: 72  Resp: 20  Temp: 98.8 F (37.1 C)  TempSrc: Oral  SpO2: 97%  Weight: 240 lb 2 oz (108.9 kg)  Height: 5' 4.5" (1.638 m)      Assessment & Plan:    TREANA LACOUR is a 61 y.o. female Abdominal pain, epigastric - Plan: CBC, Comprehensive metabolic panel, Lipase, sucralfate (CARAFATE) 1 GM/10ML suspension  Nausea without vomiting - Plan: CBC, Comprehensive metabolic panel, Lipase  Gastroesophageal reflux disease, esophagitis presence not specified - Plan: sucralfate (CARAFATE) 1 GM/10ML suspension  Remote history of peptic ulcer disease, history of GERD. Possible gastritis versus recurrence of PUD.  -  Check CBC, CMP, lipase, but less likely pancreatitis.   -Avoidance of GERD triggers, restart Protonix daily, Carafate 3 times a day with meals, and daily at bedtime.   -If pain not improving into next 2-3 days, or any worsening sooner, return for recheck.  Meds ordered this encounter  Medications  . sucralfate (CARAFATE) 1 GM/10ML suspension    Sig: Take 10 mLs (1 g total) by mouth 4 (four) times daily -  with meals and at bedtime.    Dispense:  420 mL    Refill:  0   Patient Instructions   Restart Protonix once per day. Can also take  Carafate up to 4 times per day (with meals and at bedtime). If abdominal pain is not resolved in the next 2-3 days, return for recheck and to discuss other workup. Once pain improves next week, can return to just Protonix once per day if needed. Return sooner if worsening.   Abdominal Pain, Adult Abdominal pain can be caused by many things. Often, abdominal pain is not serious and it gets better with no treatment or by being treated at home. However, sometimes abdominal pain is serious. Your health care provider will do a medical history and a physical exam to try to determine the cause of your abdominal pain. Follow these instructions at home:  Take over-the-counter and prescription medicines only as told by your health care provider. Do not take a laxative unless told by your health care provider.  Drink enough fluid to keep your urine clear or pale yellow.  Watch your condition for any changes.  Keep all follow-up visits as told by your health care provider. This is important. Contact a health care provider if:  Your abdominal pain changes or gets worse.  You are not hungry or you lose weight without trying.  You are constipated or have diarrhea for more than 2-3 days.  You have pain when you urinate or have a bowel movement.  Your abdominal pain wakes you up at night.  Your pain gets worse with meals, after eating, or with certain foods.  You are throwing up and cannot keep anything down.  You have a fever. Get help right away if:  Your pain does not go away as soon as your health care provider told you to expect.  You cannot stop throwing up.  Your pain is only in areas of the abdomen, such as the right side or the left lower portion of the abdomen.  You have bloody or black stools, or stools that look like tar.  You have severe pain, cramping, or bloating in your abdomen.  You have signs of dehydration, such as:  Dark urine, very little urine, or no  urine.  Cracked lips.  Dry mouth.  Sunken eyes.  Sleepiness.  Weakness. This information is not intended to replace advice given to you by your health care provider. Make sure you discuss any questions you have with your health care provider. Document Released: 09/18/2005 Document Revised: 06/28/2016 Document Reviewed: 05/22/2016 Elsevier Interactive Patient Education  2017 [employer].     IF you received an x-ray today, you will receive an invoice from [facility]. Please contact [facility] at [phone] with questions or concerns regarding your invoice.   IF you received labwork today, you will receive an invoice from [place]. Please contact [facility] at [phone] with questions or concerns regarding your invoice.   Our billing staff will not be able to assist you with questions regarding bills from these companies.  You will be contacted with the lab results as soon as they are available. The fastest way to get your results is to activate your My Chart account. Instructions are located on the last page of this paperwork. If you have not heard from [place] regarding the results in 2 weeks, please contact this office.      I personally performed the services described in this documentation, which was scribed in my presence. The recorded information has been reviewed and considered for accuracy and completeness, addended by me as needed, and agree with information above.  Signed,   [name], MD [facility] at [place].  05/10/17 5:38 PM

## 2017-05-11 LAB — COMPREHENSIVE METABOLIC PANEL
ALT: 20 IU/L (ref 0–32)
AST: 23 IU/L (ref 0–40)
Albumin/Globulin Ratio: 1.8 (ref 1.2–2.2)
Albumin: 4.9 g/dL — ABNORMAL HIGH (ref 3.6–4.8)
Alkaline Phosphatase: 82 IU/L (ref 39–117)
BUN/Creatinine Ratio: 12 (ref 12–28)
BUN: 9 mg/dL (ref 8–27)
Bilirubin Total: 0.3 mg/dL (ref 0.0–1.2)
CO2: 25 mmol/L (ref 18–29)
Calcium: 9.9 mg/dL (ref 8.7–10.3)
Chloride: 100 mmol/L (ref 96–106)
Creatinine, Ser: 0.77 mg/dL (ref 0.57–1.00)
GFR calc Af Amer: 95 mL/min/{1.73_m2} (ref >59)
GFR calc non Af Amer: 82 mL/min/{1.73_m2} (ref >59)
Globulin, Total: 2.7 g/dL (ref 1.5–4.5)
Glucose: 83 mg/dL (ref 65–99)
Potassium: 4.5 mmol/L (ref 3.5–5.2)
Sodium: 142 mmol/L (ref 134–144)
Total Protein: 7.6 g/dL (ref 6.0–8.5)

## 2017-05-11 LAB — CBC
Hematocrit: 41.2 % (ref 34.0–46.6)
Hemoglobin: 13.9 g/dL (ref 11.1–15.9)
MCH: 31.6 pg (ref 26.6–33.0)
MCHC: 33.7 g/dL (ref 31.5–35.7)
MCV: 94 fL (ref 79–97)
Platelets: 219 10*3/uL (ref 150–379)
RBC: 4.4 x10E6/uL (ref 3.77–5.28)
RDW: 14.2 % (ref 12.3–15.4)
WBC: 5.7 10*3/uL (ref 3.4–10.8)

## 2017-05-11 LAB — LIPASE: Lipase: 16 U/L (ref 14–72)

## 2017-05-20 ENCOUNTER — Ambulatory Visit (INDEPENDENT_AMBULATORY_CARE_PROVIDER_SITE_OTHER): Payer: PPO | Admitting: Family Medicine

## 2017-05-20 ENCOUNTER — Encounter: Payer: Self-pay | Admitting: Family Medicine

## 2017-05-20 VITALS — BP 138/73 | HR 79 | Temp 98.7°F | Resp 18 | Ht 64.5 in | Wt 241.0 lb

## 2017-05-20 DIAGNOSIS — K219 Gastro-esophageal reflux disease without esophagitis: Secondary | ICD-10-CM | POA: Diagnosis not present

## 2017-05-20 DIAGNOSIS — R1013 Epigastric pain: Secondary | ICD-10-CM | POA: Diagnosis not present

## 2017-05-20 DIAGNOSIS — R0789 Other chest pain: Secondary | ICD-10-CM

## 2017-05-20 MED ORDER — SUCRALFATE 1 GM/10ML PO SUSP: 1.0000 g | Freq: Three times a day (TID) | ORAL | 0 refills | Status: DC

## 2017-05-20 NOTE — Patient Instructions (Addendum)
  I will refer you back to Dr. [name] to evaluate your abdominal symptoms further. Because it improved with the Carafate, I suspect this is either bad heartburn, or less likely peptic ulcer disease.  Continue to avoid caffeine, spicy foods, alcohol. Restart Carafate with meals and at bedtime, continue Protonix once per day. If you experience fever, worsening abdominal pain, or it does not improve with use of Carafate, or stool changes as we discussed, follow-up here or emergency room if needed.  IF you received an x-ray today, you will receive an invoice from [facility]. Please contact [facility] at [phone] with questions or concerns regarding your invoice.   IF you received labwork today, you will receive an invoice from [place]. Please contact [facility] at [phone] with questions or concerns regarding your invoice.   Our billing staff will not be able to assist you with questions regarding bills from these companies.  You will be contacted with the lab results as soon as they are available. The fastest way to get your results is to activate your My Chart account. Instructions are located on the last page of this paperwork. If you have not heard from [place] regarding the results in 2 weeks, please contact this office.

## 2017-05-20 NOTE — Progress Notes (Signed)
By signing my name below, I, [name], attest that this documentation has been prepared under the direction and in the presence of [name], MD.  Electronically Signed: [name], Medical Scribe. 05/20/17. 12:19 PM.  Subjective:    Patient ID: Veronica Martinez, female    DOB: [birth year], 62 y.o.   MRN: [id number]  HPI Chief Complaint  Patient presents with  . Nausea  . burning in chest from reflex per patient    HPI Comments: Veronica Martinez is a 61 y.o. female who presents to [facility] at [place] for nausea, and abdominal pain follow-up. She was last seen 10 days ago with 1 week history of above sxs. Takes protonix PRN, sxs improved with previous protonix, had been drinking more coffee recently. Remote history of PUD in her 43s. Advised her to restart her protonix and add carafate QID. CBC, lipase, and CMP overall nl.  Reports her abdominal pain Improved with taking Carafate and Protonix, but returned when stopping Carafate.[name] Describes her abdominal pain as a burning sensation. Pt has been taking protonix with relief for a hour before her sxs return, and carafate with relief of her sxs. Reports abdominal pain resolves after taking carafate and when she stopped taking it 2 days ago, she noticed returning sxs yesterday. Pt no longer drinks coffee, tea, or eats spicy foods. Pt has not seen her GI recently, and she last had her colonoscopy Dec 2017. Denies fever, emesis, melena, blood in stool, and diarrhea.  Patient Active Problem List   Diagnosis Date Noted  . 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 Right hip pain 08/09/2013  . Trigger finger, acquired 04/22/2013  . Carpal tunnel syndrome of right wrist 04/22/2013  . Pain of right heel 12/08/2012  . Myalgia 11/10/2012  . OBESITY, UNSPECIFIED 02/20/2011  . BACK PAIN, LUMBAR 07/05/2010  . ABNORMALITY OF GAIT 05/02/2010  . MERALGIA PARESTHETICA 03/02/2010  .  HIP PAIN, BILATERAL 03/02/2010   Past Medical History:  Diagnosis Date  . Allergy   . GERD (gastroesophageal reflux disease)   . Headache(784.0)    sinus  . High cholesterol   . History of Graves' disease   . Hx of radioactive iodine thyroid ablation   . Hypothyroidism    Past Surgical History:  Procedure Laterality Date  . ABDOMINAL HYSTERECTOMY    . APPENDECTOMY    . CATARACT EXTRACTION W/PHACO Right 09/22/2013   Procedure: CATARACT EXTRACTION PHACO AND INTRAOCULAR LENS PLACEMENT (IOC);  Surgeon: [name], MD;  Location: [place];  Service: Ophthalmology;  Laterality: Right;  . CATARACT EXTRACTION W/PHACO Left 12/29/2013   Procedure: CATARACT EXTRACTION PHACO AND INTRAOCULAR LENS PLACEMENT (IOC);  Surgeon: [name], MD;  Location: [place];  Service: Ophthalmology;  Laterality: Left;  . COLONOSCOPY    . EYE SURGERY    . HIP ARTHROPLASTY Bilateral   . JOINT REPLACEMENT    . OTHER SURGICAL HISTORY Right    carpal tunel injection  . SHOULDER SURGERY    . TONSILLECTOMY     Allergies  Allergen Reactions  . Meloxicam Other (See Comments)    Causes Bruising   . Pravastatin Sodium Other (See Comments)    Bruises  . Simvastatin Other (See Comments)    Bruises   . Tramadol Hcl Other (See Comments)    Bruising    Prior to Admission medications   Medication Sig Start Date End Date Taking? Authorizing Provider  Cholecalciferol (VITAMIN D) 2000  UNITS CAPS Take 2,000 Units by mouth daily.    Yes [provider]  Coenzyme Q10 (CO Q 10 PO) Take 75 mg by mouth 3 (three) times daily.   Yes [provider]  cyanocobalamin 2000 MCG tablet Take 2,000 mcg by mouth daily. Vitamin B12   Yes [provider]  fluticasone (FLONASE) 50 MCG/ACT nasal spray TAKE 2 PUFFS EACH NOSTRIL EVERY DAY 03/12/17  Yes Tereasa Coop, PA-C  gabapentin (NEURONTIN) 600 MG tablet Take 1 tablet (600 mg total) by mouth 3 (three) times daily. 01/16/17  Yes Wendie Agreste, MD    Guaifenesin [facility] MAXIMUM STRENGTH) 1200 MG TB12 Take 1 tablet (1,200 mg total) by mouth every 12 (twelve) hours as needed. 05/10/16  Yes English, Colletta [place] D, PA  ibuprofen (ADVIL,MOTRIN) 600 MG tablet TAKE 1 TABLET BY MOUTH EVERY 6 TO 8 HOURS AS NEEDED FOR PAIN 03/25/16  Yes [provider]  Misc Natural Products (CALCIUM PLUS ADVANCED PO) Take 600 mg by mouth 2 (two) times daily.    Yes [provider]  Multiple Vitamins-Minerals (CENTRUM SILVER PO) Take by mouth.   Yes [provider]  Omega-3 Fatty Acids (FISH OIL) 1200 MG CAPS Take 1,200 mg by mouth 3 (three) times daily.    Yes [provider]  pantoprazole (PROTONIX) 40 MG tablet Take 1 tablet (40 mg total) by mouth daily. 01/16/17  Yes Wendie Agreste, MD  rosuvastatin (CRESTOR) 5 MG tablet Take 1 tablet (5 mg total) by mouth daily at 6 PM. 05/02/17  Yes Wendie Agreste, MD  sucralfate (CARAFATE) 1 GM/10ML suspension Take 10 mLs (1 g total) by mouth 4 (four) times daily -  with meals and at bedtime. 05/10/17  Yes Wendie Agreste, MD  SYNTHROID 100 MCG tablet Take 1 tablet (100 mcg total) by mouth daily before breakfast. 01/16/17  Yes Wendie Agreste, MD  vitamin E 400 UNIT capsule Take 400 Units by mouth daily.   Yes [provider]   Social History   Social History  . Marital status: Married    Spouse name: N/A  . Number of children: N/A  . Years of education: N/A   Occupational History  . Not on file.   Social History Main Topics  . Smoking status: Never Smoker  . Smokeless tobacco: Never Used  . Alcohol use No  . Drug use: No  . Sexual activity: Yes    Partners: Male   Other Topics Concern  . Not on file   Social History Narrative   Married. Education: [employer].    Review of Systems  Constitutional: Negative for fever.  Gastrointestinal: Positive for abdominal pain and nausea. Negative for blood in stool, diarrhea and vomiting.   Objective:  Physical Exam   Constitutional: She appears well-developed and well-nourished. No distress.  HENT:  Head: Normocephalic and atraumatic.  Eyes: Conjunctivae are normal.  Neck: Neck supple.  Cardiovascular: Normal rate.   Pulmonary/Chest: Effort normal.  Abdominal: There is no tenderness.  No tenderness but describes area as epigastric to lower chest when she does have sxs  Neurological: She is alert.  Skin: Skin is warm and dry.  Psychiatric: She has a normal mood and affect. Her behavior is normal.  Nursing note and vitals reviewed.   Vitals:   05/20/17 1147  BP: 138/73  Pulse: 79  Resp: 18  Temp: 98.7 F (37.1 C)  TempSrc: Oral  SpO2: 98%  Weight: 241 lb (109.3 kg)  Height: 5'  4.5" (1.638 m)  Body mass index is 40.73 kg/m. Assessment & Plan:   Veronica Martinez is a 61 y.o. female Burning chest pain - Plan: Ambulatory referral to Gastroenterology, Care order/instruction:  Abdominal pain, epigastric - Plan: sucralfate (CARAFATE) 1 GM/10ML suspension  Gastroesophageal reflux disease, esophagitis presence not specified - Plan: sucralfate (CARAFATE) 1 GM/10ML suspension  Epigastric abdominal pain with burning chest sensation, possible esophagitis versus peptic ulcer disease. CBC reassuring last visit. Pain improved/resolved with Carafate and PPI daily, now with return of symptoms off Carafate.  Restart Carafate, continue Protonix 40 mg daily, and refer to gastroenterology as may need endoscopy/further evaluation. RTC/ER precautions if worsening  Meds ordered this encounter  Medications  . sucralfate (CARAFATE) 1 GM/10ML suspension    Sig: Take 10 mLs (1 g total) by mouth 4 (four) times daily -  with meals and at bedtime.    Dispense:  420 mL    Refill:  0   Patient Instructions    I will refer you back to Dr. [name] to evaluate your abdominal symptoms further. Because it improved with the Carafate, I suspect this is either bad heartburn, or less likely peptic ulcer disease.  Continue  to avoid caffeine, spicy foods, alcohol. Restart Carafate with meals and at bedtime, continue Protonix once per day. If you experience fever, worsening abdominal pain, or it does not improve with use of Carafate, or stool changes as we discussed, follow-up here or emergency room if needed.  IF you received an x-ray today, you will receive an invoice from [facility]. Please contact [facility] at [phone] with questions or concerns regarding your invoice.   IF you received labwork today, you will receive an invoice from [place]. Please contact [facility] at [phone] with questions or concerns regarding your invoice.   Our billing staff will not be able to assist you with questions regarding bills from these companies.  You will be contacted with the lab results as soon as they are available. The fastest way to get your results is to activate your My Chart account. Instructions are located on the last page of this paperwork. If you have not heard from [place] regarding the results in 2 weeks, please contact this office.       I personally performed the services described in this documentation, which was scribed in my presence. The recorded information has been reviewed and considered for accuracy and completeness, addended by me as needed, and agree with information above.  Signed,   [name], MD [facility] at [place].  05/20/17 7:20 PM

## 2017-05-23 ENCOUNTER — Other Ambulatory Visit: Payer: Self-pay | Admitting: Physician Assistant

## 2017-06-23 ENCOUNTER — Telehealth: Payer: Self-pay | Admitting: Family Medicine

## 2017-06-23 NOTE — Telephone Encounter (Signed)
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

## 2017-06-24 ENCOUNTER — Other Ambulatory Visit: Payer: Self-pay

## 2017-06-24 DIAGNOSIS — M792 Neuralgia and neuritis, unspecified: Secondary | ICD-10-CM

## 2017-06-24 MED ORDER — GABAPENTIN 600 MG PO TABS: 600.0000 mg | ORAL_TABLET | Freq: Three times a day (TID) | ORAL | 0 refills | Status: DC

## 2017-06-24 NOTE — Progress Notes (Unsigned)
RX reordered and sent to provider to sign.

## 2017-06-24 NOTE — Telephone Encounter (Signed)
Sent to Dr. Carlota Raspberry for refill.

## 2017-06-30 ENCOUNTER — Encounter: Payer: Self-pay | Admitting: Family Medicine

## 2017-06-30 ENCOUNTER — Ambulatory Visit (INDEPENDENT_AMBULATORY_CARE_PROVIDER_SITE_OTHER): Payer: PPO | Admitting: Family Medicine

## 2017-06-30 VITALS — BP 127/72 | HR 80 | Temp 97.8°F | Resp 18 | Ht 64.5 in | Wt 242.6 lb

## 2017-06-30 DIAGNOSIS — G5711 Meralgia paresthetica, right lower limb: Secondary | ICD-10-CM

## 2017-06-30 DIAGNOSIS — R1013 Epigastric pain: Secondary | ICD-10-CM

## 2017-06-30 NOTE — Patient Instructions (Addendum)
Your pain still appears to be in typical distribution of meralgia paresthetica. This is likely flared from recent work at home. As we discussed,  back can also contribute to some of that leg pain.  Continue gabapentin to 3 times per day. Compression shirt or binder for skin on abdomen may help.  Tylenol if needed, avoid Aleve for other NSAIDs until cleared by your gastroenterologist. Follow-up with Dr. [name] as planned. Let me know if you have questions in the meantime.   Return to the clinic or go to the nearest emergency room if any of your symptoms worsen or new symptoms occur.     IF you received an x-ray today, you will receive an invoice from [facility]. Please contact [facility] at [phone] with questions or concerns regarding your invoice.   IF you received labwork today, you will receive an invoice from [place]. Please contact [facility] at [phone] with questions or concerns regarding your invoice.   Our billing staff will not be able to assist you with questions regarding bills from these companies.  You will be contacted with the lab results as soon as they are available. The fastest way to get your results is to activate your My Chart account. Instructions are located on the last page of this paperwork. If you have not heard from [place] regarding the results in 2 weeks, please contact this office.

## 2017-06-30 NOTE — Progress Notes (Signed)
Subjective:  By signing my name below, I, Veronica Martinez, attest that this documentation has been prepared under the direction and in the presence of Wendie Agreste, MD Electronically Signed: Ladene Artist, ED Scribe 06/30/2017 at 10:31 AM.   Patient ID: Veronica Martinez, female    DOB: 05-02-1956, 61 y.o.   MRN: [id number]  Chief Complaint  Patient presents with  . Medication    per pt would like to talk about Gabapentin and discuss a refill but she has a two month supply currently  . Follow-up    05/20/2017, on stomach issues, per pt stomach issues have been better.   HPI Veronica Martinez is a 61 y.o. female who presents to Primary Care at [facility]. Last seen 5/29.  Abdominal Pain Suspected PUD vs GERD/gastritis. Restarted on Carafate, continued Protonix 40 mg qd and referred to gastroenterology Dr. [name]. Appointment on 7/16. She denies blood in stools or melena.   R Leg Pain Thought to be neuralgia paresthetica. Discussed in Jan. Continues on Gabapentin 600 mg tid. Last lumbar spine XR was in 2011 with normal alignment and no acute bony findings. She has had bilateral total hip replacements, last imaged in 2014 without acute abnormalities. She was seen by Sports Medicine Dr. Oneida Alar 07/2015, had been treated with Gabapentin same dose at that time. Based on that office visit, she had MRI in the past showing arthritis in her low back but images were not available. She was continued on Gabapentin and Naproxen.   Pt states that right leg pain flared up recently last week after doing a lot of cleaning. Sates right leg pain is exacerbated with leaning forward. States she typically takes Gabapentin bid but has increased to tid since flare up. She has also applied an ice pack to the area, has been tying a t-shirt to her right leg for support and Aleve last night. Pt denies bladder/bowel incontinence, saddle anesthesia, weakness in LE.  Patient Active Problem List   Diagnosis Date Noted  . [facility]  arthritis, thumb, degenerative 04/04/2016  . 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 Right hip pain 08/09/2013  . Trigger finger, acquired 04/22/2013  . Carpal tunnel syndrome of right wrist 04/22/2013  . Pain of right heel 12/08/2012  . Myalgia 11/10/2012  . OBESITY, UNSPECIFIED 02/20/2011  . BACK PAIN, LUMBAR 07/05/2010  . ABNORMALITY OF GAIT 05/02/2010  . MERALGIA PARESTHETICA 03/02/2010  . HIP PAIN, BILATERAL 03/02/2010   Past Medical History:  Diagnosis Date  . Allergy   . GERD (gastroesophageal reflux disease)   . Headache(784.0)    sinus  . High cholesterol   . History of Graves' disease   . Hx of radioactive iodine thyroid ablation   . Hypothyroidism    Past Surgical History:  Procedure Laterality Date  . ABDOMINAL HYSTERECTOMY    . APPENDECTOMY    . CATARACT EXTRACTION W/PHACO Right 09/22/2013   Procedure: CATARACT EXTRACTION PHACO AND INTRAOCULAR LENS PLACEMENT (IOC);  Surgeon: [name], MD;  Location: [place];  Service: Ophthalmology;  Laterality: Right;  . CATARACT EXTRACTION W/PHACO Left 12/29/2013   Procedure: CATARACT EXTRACTION PHACO AND INTRAOCULAR LENS PLACEMENT (IOC);  Surgeon: [name], MD;  Location: [place];  Service: Ophthalmology;  Laterality: Left;  . COLONOSCOPY    . EYE SURGERY    . HIP ARTHROPLASTY Bilateral   . JOINT REPLACEMENT    . OTHER SURGICAL HISTORY Right    carpal tunel injection  . SHOULDER SURGERY    .  TONSILLECTOMY     Allergies  Allergen Reactions  . Meloxicam Other (See Comments)    Causes Bruising   . Pravastatin Sodium Other (See Comments)    Bruises  . Simvastatin Other (See Comments)    Bruises   . Tramadol Hcl Other (See Comments)    Bruising    Prior to Admission medications   Medication Sig Start Date End Date Taking? Authorizing Provider  Cholecalciferol (VITAMIN D) 2000 UNITS CAPS Take 2,000 Units by mouth daily.    Yes [provider]  Coenzyme Q10 (CO Q 10 PO) Take 75 mg by mouth 3  (three) times daily.   Yes [provider]  cyanocobalamin 2000 MCG tablet Take 2,000 mcg by mouth daily. Vitamin B12   Yes [provider]  fluticasone (FLONASE) 50 MCG/ACT nasal spray INHALE 2 PUFFS INTO EACH NOSTRIL EVERY DAY 05/24/17  Yes Wendie Agreste, MD  gabapentin (NEURONTIN) 600 MG tablet Take 1 tablet (600 mg total) by mouth 3 (three) times daily. 06/24/17  Yes Wendie Agreste, MD  Guaifenesin [facility] MAXIMUM STRENGTH) 1200 MG TB12 Take 1 tablet (1,200 mg total) by mouth every 12 (twelve) hours as needed. 05/10/16  Yes English, Colletta [place] D, PA  ibuprofen (ADVIL,MOTRIN) 600 MG tablet TAKE 1 TABLET BY MOUTH EVERY 6 TO 8 HOURS AS NEEDED FOR PAIN 03/25/16  Yes [provider]  Misc Natural Products (CALCIUM PLUS ADVANCED PO) Take 600 mg by mouth 2 (two) times daily.    Yes [provider]  Multiple Vitamins-Minerals (CENTRUM SILVER PO) Take by mouth.   Yes [provider]  Omega-3 Fatty Acids (FISH OIL) 1200 MG CAPS Take 1,200 mg by mouth 3 (three) times daily.    Yes [provider]  pantoprazole (PROTONIX) 40 MG tablet Take 1 tablet (40 mg total) by mouth daily. 01/16/17  Yes Wendie Agreste, MD  rosuvastatin (CRESTOR) 5 MG tablet Take 1 tablet (5 mg total) by mouth daily at 6 PM. 05/02/17  Yes Wendie Agreste, MD  sucralfate (CARAFATE) 1 GM/10ML suspension Take 10 mLs (1 g total) by mouth 4 (four) times daily -  with meals and at bedtime. 05/20/17  Yes Wendie Agreste, MD  SYNTHROID 100 MCG tablet Take 1 tablet (100 mcg total) by mouth daily before breakfast. 01/16/17  Yes Wendie Agreste, MD  vitamin E 400 UNIT capsule Take 400 Units by mouth daily.   Yes [provider]   Social History   Social History  . Marital status: Married    Spouse name: N/A  . Number of children: N/A  . Years of education: N/A   Occupational History  . Not on file.   Social History Main Topics  . Smoking status: Never Smoker  .  Smokeless tobacco: Never Used  . Alcohol use No  . Drug use: No  . Sexual activity: Yes    Partners: Male   Other Topics Concern  . Not on file   Social History Narrative   Married. Education: [employer].    Review of Systems  Gastrointestinal: Negative for blood in stool.  Musculoskeletal: Positive for myalgias.  Neurological: Negative for weakness.      Objective:   Physical Exam  Constitutional: She is oriented to person, place, and time. She appears well-developed and well-nourished. No distress.  HENT:  Head: Normocephalic and atraumatic.  Eyes: Conjunctivae and EOM are normal.  Neck: Neck supple. No tracheal deviation present.  Cardiovascular: Normal rate.   Pulmonary/Chest: Effort  normal. No respiratory distress.  Abdominal: Soft. She exhibits no distension. There is no tenderness. No hernia.  No erythema or wounds. No discharge. No hernias.   Musculoskeletal: Normal range of motion.  R Leg: Describes area of pain along the anterior surface of the upper R thigh. Knee is non-tender. Negative straight leg raise on the R. Lumbar: No midline bony tenderness of lumbar spine.  Neurological: She is alert and oriented to person, place, and time.  Skin: Skin is warm and dry.  Psychiatric: She has a normal mood and affect. Her behavior is normal.  Nursing note and vitals reviewed.  Vitals:   06/30/17 1005  BP: 127/72  Pulse: 80  Resp: 18  Temp: 97.8 F (36.6 C)  TempSrc: Oral  SpO2: 96%  Weight: 242 lb 9.6 oz (110 kg)  Height: 5' 4.5" (1.638 m)      Assessment & Plan:  Veronica Martinez is a 61 y.o. female Meralgia paresthetica of right side  - Suspected flare of above with recent housework. We have component of lumbar spine degenerative disc disease, but area of involvement appears to be typical meralgia paresthetica. May have some compression from abdominal pannus.   - Continue gabapentin 600mg  3 times a day as needed as did not tolerate higher dosing in the past,  and current symptoms improving. Can decrease dose as tolerated once symptoms improve.  - Continue to work on weight loss, compression shirt or binder for abdominal pannus may also decrease compression on nerve.  -  RTC precautions.  Abdominal pain, epigastric  - GERD/gastritis versus esophagitis or possible PUD. However she is improving and has pending follow-up with gastroenterology. Continue proton pump inhibitor daily and Carafate for now.  No orders of the defined types were placed in this encounter.  Patient Instructions   Your pain still appears to be in typical distribution of meralgia paresthetica. This is likely flared from recent work at home. As we discussed,  back can also contribute to some of that leg pain.  Continue gabapentin to 3 times per day. Compression shirt or binder for skin on abdomen may help.  Tylenol if needed, avoid Aleve for other NSAIDs until cleared by your gastroenterologist. Follow-up with Dr. [name] as planned. Let me know if you have questions in the meantime.   Return to the clinic or go to the nearest emergency room if any of your symptoms worsen or new symptoms occur.     IF you received an x-ray today, you will receive an invoice from [facility] Radiology. Please contact [facility] Radiology at [phone] with questions or concerns regarding your invoice.   IF you received labwork today, you will receive an invoice from [facility]. Please contact [facility] at [phone] with questions or concerns regarding your invoice.   Our billing staff will not be able to assist you with questions regarding bills from these companies.  You will be contacted with the lab results as soon as they are available. The fastest way to get your results is to activate your My Chart account. Instructions are located on the last page of this paperwork. If you have not heard from [place] regarding the results in 2 weeks, please contact this office.      I personally performed  the services described in this documentation, which was scribed in my presence. The recorded information has been reviewed and considered for accuracy and completeness, addended by me as needed, and agree with information above.  Signed,   [name], MD Primary  Care at [place].  06/30/17 10:48 AM

## 2017-07-04 DIAGNOSIS — R1013 Epigastric pain: Secondary | ICD-10-CM | POA: Diagnosis not present

## 2017-08-11 ENCOUNTER — Encounter: Payer: Self-pay | Admitting: Family Medicine

## 2017-08-11 DIAGNOSIS — K2930 Chronic superficial gastritis without bleeding: Secondary | ICD-10-CM | POA: Diagnosis not present

## 2017-08-11 DIAGNOSIS — R1013 Epigastric pain: Secondary | ICD-10-CM | POA: Diagnosis not present

## 2017-08-18 DIAGNOSIS — K2930 Chronic superficial gastritis without bleeding: Secondary | ICD-10-CM | POA: Diagnosis not present

## 2017-08-21 ENCOUNTER — Ambulatory Visit (INDEPENDENT_AMBULATORY_CARE_PROVIDER_SITE_OTHER): Payer: PPO

## 2017-08-21 VITALS — BP 120/77 | HR 87 | Temp 98.0°F | Ht 64.0 in | Wt 239.5 lb

## 2017-08-21 DIAGNOSIS — R110 Nausea: Secondary | ICD-10-CM | POA: Diagnosis not present

## 2017-08-21 DIAGNOSIS — Z0000 Encounter for general adult medical examination without abnormal findings: Secondary | ICD-10-CM

## 2017-08-21 DIAGNOSIS — R1013 Epigastric pain: Secondary | ICD-10-CM | POA: Diagnosis not present

## 2017-08-21 DIAGNOSIS — K219 Gastro-esophageal reflux disease without esophagitis: Secondary | ICD-10-CM | POA: Diagnosis not present

## 2017-08-21 DIAGNOSIS — Z23 Encounter for immunization: Secondary | ICD-10-CM

## 2017-08-21 NOTE — Patient Instructions (Addendum)
Veronica Martinez , Thank you for taking time to come for your Medicare Wellness Visit. I appreciate your ongoing commitment to your health goals. Please review the following plan we discussed and let me know if I can assist you in the future.   Screening recommendations/referrals: Colonoscopy: completed 10/25/2016 Mammogram: completed 03/13/2017 Bone Density: start at age 63 Recommended yearly ophthalmology/optometry visit for glaucoma screening and checkup Recommended yearly dental visit for hygiene and checkup  Vaccinations: Influenza vaccine: administered today  Pneumococcal vaccine: after 63 years of age  Tdap vaccine: completed 01/10/2015 Shingles vaccine: Patient undecided. Will call if she decides to have this done.   Advanced directives: Advance directive discussed with you today. I have provided a copy for you to complete at home and have notarized. Once this is complete please bring a copy in to our office so we can scan it into your chart.   Conditions/risks identified: Patient is trying to lose about 60 lbs over the next 5-6 months.    Next appointment: schedule follow up with PCP in 6 months and 1 year for AWV  Preventive Care 40-64 Years, Female Preventive care refers to lifestyle choices and visits with your health care provider that can promote health and wellness. What does preventive care include?  A yearly physical exam. This is also called an annual well check.  Dental exams once or twice a year.  Routine eye exams. Ask your health care provider how often you should have your eyes checked.  Personal lifestyle choices, including:  Daily care of your teeth and gums.  Regular physical activity.  Eating a healthy diet.  Avoiding tobacco and drug use.  Limiting alcohol use.  Practicing safe sex.  Taking low-dose aspirin daily starting at age 50.  Taking vitamin and mineral supplements as recommended by your health care provider. What happens during an annual  well check? The services and screenings done by your health care provider during your annual well check will depend on your age, overall health, lifestyle risk factors, and family history of disease. Counseling  Your health care provider may ask you questions about your:  Alcohol use.  Tobacco use.  Drug use.  Emotional well-being.  Home and relationship well-being.  Sexual activity.  Eating habits.  Work and work environment.  Method of birth control.  Menstrual cycle.  Pregnancy history. Screening  You may have the following tests or measurements:  Height, weight, and BMI.  Blood pressure.  Lipid and cholesterol levels. These may be checked every 5 years, or more frequently if you are over 50 years old.  Skin check.  Lung cancer screening. You may have this screening every year starting at age 55 if you have a 30-pack-year history of smoking and currently smoke or have quit within the past 15 years.  Fecal occult blood test (FOBT) of the stool. You may have this test every year starting at age 50.  Flexible sigmoidoscopy or colonoscopy. You may have a sigmoidoscopy every 5 years or a colonoscopy every 10 years starting at age 50.  Hepatitis C blood test.  Hepatitis B blood test.  Sexually transmitted disease (STD) testing.  Diabetes screening. This is done by checking your blood sugar (glucose) after you have not eaten for a while (fasting). You may have this done every 1-3 years.  Mammogram. This may be done every 1-2 years. Talk to your health care provider about when you should start having regular mammograms. This may depend on whether you have a family history   of breast cancer.  BRCA-related cancer screening. This may be done if you have a family history of breast, ovarian, tubal, or peritoneal cancers.  Pelvic exam and Pap test. This may be done every 3 years starting at age 53. Starting at age 36, this may be done every 5 years if you have a Pap test in  combination with an HPV test.  Bone density scan. This is done to screen for osteoporosis. You may have this scan if you are at high risk for osteoporosis. Discuss your test results, treatment options, and if necessary, the need for more tests with your health care provider. Vaccines  Your health care provider may recommend certain vaccines, such as:  Influenza vaccine. This is recommended every year.  Tetanus, diphtheria, and acellular pertussis (Tdap, Td) vaccine. You may need a Td booster every 10 years.  Zoster vaccine. You may need this after age 55.  Pneumococcal 13-valent conjugate (PCV13) vaccine. You may need this if you have certain conditions and were not previously vaccinated.  Pneumococcal polysaccharide (PPSV23) vaccine. You may need one or two doses if you smoke cigarettes or if you have certain conditions. Talk to your health care provider about which screenings and vaccines you need and how often you need them. This information is not intended to replace advice given to you by your health care provider. Make sure you discuss any questions you have with your health care provider. Document Released: 01/05/2016 Document Revised: 08/28/2016 Document Reviewed: 10/10/2015 Elsevier Interactive Patient Education  2017 [place] Prevention in the Home Falls can cause injuries. They can happen to people of all ages. There are many things you can do to make your home safe and to help prevent falls. What can I do on the outside of my home?  Regularly fix the edges of walkways and driveways and fix any cracks.  Remove anything that might make you trip as you walk through a door, such as a raised step or threshold.  Trim any bushes or trees on the path to your home.  Use bright outdoor lighting.  Clear any walking paths of anything that might make someone trip, such as rocks or tools.  Regularly check to see if handrails are loose or broken. Make sure that both  sides of any steps have handrails.  Any raised decks and porches should have guardrails on the edges.  Have any leaves, snow, or ice cleared regularly.  Use sand or salt on walking paths during winter.  Clean up any spills in your garage right away. This includes oil or grease spills. What can I do in the bathroom?  Use night lights.  Install grab bars by the toilet and in the tub and shower. Do not use towel bars as grab bars.  Use non-skid mats or decals in the tub or shower.  If you need to sit down in the shower, use a plastic, non-slip stool.  Keep the floor dry. Clean up any water that spills on the floor as soon as it happens.  Remove soap buildup in the tub or shower regularly.  Attach bath mats securely with double-sided non-slip rug tape.  Do not have throw rugs and other things on the floor that can make you trip. What can I do in the bedroom?  Use night lights.  Make sure that you have a light by your bed that is easy to reach.  Do not use any sheets or blankets that are too big  for your bed. They should not hang down onto the floor.  Have a firm chair that has side arms. You can use this for support while you get dressed.  Do not have throw rugs and other things on the floor that can make you trip. What can I do in the kitchen?  Clean up any spills right away.  Avoid walking on wet floors.  Keep items that you use a lot in easy-to-reach places.  If you need to reach something above you, use a strong step stool that has a grab bar.  Keep electrical cords out of the way.  Do not use floor polish or wax that makes floors slippery. If you must use wax, use non-skid floor wax.  Do not have throw rugs and other things on the floor that can make you trip. What can I do with my stairs?  Do not leave any items on the stairs.  Make sure that there are handrails on both sides of the stairs and use them. Fix handrails that are broken or loose. Make sure that  handrails are as long as the stairways.  Check any carpeting to make sure that it is firmly attached to the stairs. Fix any carpet that is loose or worn.  Avoid having throw rugs at the top or bottom of the stairs. If you do have throw rugs, attach them to the floor with carpet tape.  Make sure that you have a light switch at the top of the stairs and the bottom of the stairs. If you do not have them, ask someone to add them for you. What else can I do to help prevent falls?  Wear shoes that:  Do not have high heels.  Have rubber bottoms.  Are comfortable and fit you well.  Are closed at the toe. Do not wear sandals.  If you use a stepladder:  Make sure that it is fully opened. Do not climb a closed stepladder.  Make sure that both sides of the stepladder are locked into place.  Ask someone to hold it for you, if possible.  Clearly mark and make sure that you can see:  Any grab bars or handrails.  First and last steps.  Where the edge of each step is.  Use tools that help you move around (mobility aids) if they are needed. These include:  Canes.  Walkers.  Scooters.  Crutches.  Turn on the lights when you go into a dark area. Replace any light bulbs as soon as they burn out.  Set up your furniture so you have a clear path. Avoid moving your furniture around.  If any of your floors are uneven, fix them.  If there are any pets around you, be aware of where they are.  Review your medicines with your doctor. Some medicines can make you feel dizzy. This can increase your chance of falling. Ask your doctor what other things that you can do to help prevent falls. This information is not intended to replace advice given to you by your health care provider. Make sure you discuss any questions you have with your health care provider. Document Released: 10/05/2009 Document Revised: 05/16/2016 Document Reviewed: 01/13/2015 Elsevier Interactive Patient Education  2017  [employer].

## 2017-08-21 NOTE — Progress Notes (Signed)
Subjective:   Veronica Martinez is a 61 y.o. female who presents for an Initial Medicare Annual Wellness Visit.  Review of Systems    N/A  Cardiac Risk Factors include: dyslipidemia;obesity (BMI >30kg/m2)     Objective:    Today's Vitals   08/21/17 1134  BP: 120/77  Pulse: 87  Temp: 98 F (36.7 C)  TempSrc: Oral  Weight: 239 lb 8 oz (108.6 kg)  Height: 5\' 4"  (1.626 m)   Body mass index is 41.11 kg/m.   Current Medications (verified) Outpatient Encounter Prescriptions as of 08/21/2017  Medication Sig  . Cholecalciferol (VITAMIN D) 2000 UNITS CAPS Take 2,000 Units by mouth daily.   . Coenzyme Q10 (CO Q 10 PO) Take 75 mg by mouth 3 (three) times daily.  . cyanocobalamin 2000 MCG tablet Take 2,000 mcg by mouth daily. Vitamin B12  . fluticasone (FLONASE) 50 MCG/ACT nasal spray INHALE 2 PUFFS INTO EACH NOSTRIL EVERY DAY  . gabapentin (NEURONTIN) 600 MG tablet Take 1 tablet (600 mg total) by mouth 3 (three) times daily.  . Guaifenesin (MUCINEX MAXIMUM STRENGTH) 1200 MG TB12 Take 1 tablet (1,200 mg total) by mouth every 12 (twelve) hours as needed.  [name] ibuprofen (ADVIL,MOTRIN) 600 MG tablet TAKE 1 TABLET BY MOUTH EVERY 6 TO 8 HOURS AS NEEDED FOR PAIN  . Misc Natural Products (CALCIUM PLUS ADVANCED PO) Take 600 mg by mouth 2 (two) times daily.   . Multiple Vitamins-Minerals (CENTRUM SILVER PO) Take by mouth.  . Omega-3 Fatty Acids (FISH OIL) 1200 MG CAPS Take 1,200 mg by mouth 3 (three) times daily.   . pantoprazole (PROTONIX) 40 MG tablet Take 1 tablet (40 mg total) by mouth daily.  . rosuvastatin (CRESTOR) 5 MG tablet Take 1 tablet (5 mg total) by mouth daily at 6 PM.  . SYNTHROID 100 MCG tablet Take 1 tablet (100 mcg total) by mouth daily before breakfast.  . vitamin E 400 UNIT capsule Take 400 Units by mouth daily.  . [DISCONTINUED] sucralfate (CARAFATE) 1 GM/10ML suspension Take 10 mLs (1 g total) by mouth 4 (four) times daily -  with meals and at bedtime.   No  facility-administered encounter medications on file as of 08/21/2017.     Allergies (verified) Meloxicam; Pravastatin sodium; Simvastatin; and Tramadol hcl   History: Past Medical History:  Diagnosis Date  . Allergy   . GERD (gastroesophageal reflux disease)   . Headache(784.0)    sinus  . High cholesterol   . History of Graves' disease   . Hx of radioactive iodine thyroid ablation   . Hypothyroidism    Past Surgical History:  Procedure Laterality Date  . ABDOMINAL HYSTERECTOMY    . APPENDECTOMY    . CATARACT EXTRACTION W/PHACO Right 09/22/2013   Procedure: CATARACT EXTRACTION PHACO AND INTRAOCULAR LENS PLACEMENT (IOC);  Surgeon: [name], MD;  Location: [facility];  Service: Ophthalmology;  Laterality: Right;  . CATARACT EXTRACTION W/PHACO Left 12/29/2013   Procedure: CATARACT EXTRACTION PHACO AND INTRAOCULAR LENS PLACEMENT (IOC);  Surgeon: [name], MD;  Location: [facility];  Service: Ophthalmology;  Laterality: Left;  . COLONOSCOPY    . EYE SURGERY    . HIP ARTHROPLASTY Bilateral   . JOINT REPLACEMENT    . OTHER SURGICAL HISTORY Right    carpal tunel injection  . SHOULDER SURGERY    . TONSILLECTOMY     Family History  Problem Relation Age of Onset  . Hypertension Mother   . Hyperlipidemia Mother   . Diabetes  Sister   . Obesity Brother        gastric bypass 2018  . Thyroid disease Sister   . Obesity Sister    Social History   Occupational History  . Not on file.   Social History Main Topics  . Smoking status: Never Smoker  . Smokeless tobacco: Never Used  . Alcohol use No  . Drug use: No  . Sexual activity: Yes    Partners: Male    Tobacco Counseling Counseling given: Not Answered   Activities of Daily Living In your present state of health, do you have any difficulty performing the following activities: 08/21/2017  Hearing? N  Vision? N  Difficulty concentrating or making decisions? N  Walking or climbing stairs? N  Dressing or bathing? N  Doing  errands, shopping? N  Preparing Food and eating ? N  Using the Toilet? N  In the past six months, have you accidently leaked urine? Y  Comment Patient wears a pad daily for urinary incontinence  Do you have problems with loss of bowel control? N  Managing your Medications? N  Managing your Finances? N  Housekeeping or managing your Housekeeping? N  Some recent data might be hidden    Immunizations and Health Maintenance Immunization History  Administered Date(s) Administered  . Influenza Split 09/01/2012  . Influenza,inj,Quad PF,6+ Mos 09/07/2013, 01/10/2015, 12/26/2015, 08/21/2017  . Tdap 01/10/2015   Health Maintenance Due  Topic Date Due  . PAP SMEAR  12/23/2014    Patient Care Team: Wendie Agreste, MD as PCP - General (Family Medicine) Warden Fillers, MD as Consulting Physician (Ophthalmology) Ronald Lobo, MD as Consulting Physician (Gastroenterology)  Indicate any recent Medical Services you may have received from other than [facility] providers in the past year (date may be approximate).     Assessment:   This is a routine wellness examination for [place].   Hearing/Vision screen Vision Screening Comments: Patient sees Dr. Katy Fitch for regular eye exams. She visits him yearly.   Dietary issues and exercise activities discussed: Current Exercise Habits: Home exercise routine, Type of exercise: walking, Time (Minutes): 30, Frequency (Times/Week): 2, Weekly Exercise (Minutes/Week): 60, Intensity: Mild, Exercise limited by: None identified  Goals    . Weight (lb) < 180 lb (81.6 kg)          Patient is trying to lose about 60 lbs over the next 5-6 months.       Depression Screen PHQ 2/9 Scores 08/21/2017 06/30/2017 05/20/2017 05/10/2017 01/16/2017 07/06/2016 06/27/2016  PHQ - 2 Score 0 0 0 0 0 0 0  Exception Documentation - - - - - - -    Fall Risk Fall Risk  08/21/2017 06/30/2017 05/20/2017 05/10/2017 01/16/2017  Falls in the past year? No No No No No  Risk for fall due to  : - - - - -    Cognitive Function:     6CIT Screen 08/21/2017  What Year? 0 points  What month? 0 points  What time? 0 points  Count back from 20 0 points  Months in reverse 0 points  Repeat phrase 6 points  Total Score 6    Screening Tests Health Maintenance  Topic Date Due  . PAP SMEAR  12/23/2014  . MAMMOGRAM  03/14/2019  . TETANUS/TDAP  01/10/2025  . COLONOSCOPY  10/25/2026  . INFLUENZA VACCINE  Completed  . Hepatitis C Screening  Completed  . HIV Screening  Completed      Plan:  I have personally reviewed and  noted the following in the patient's chart:   . Medical and social history . Use of alcohol, tobacco or illicit drugs  . Current medications and supplements . Functional ability and status . Nutritional status . Physical activity . Advanced directives . List of other physicians . Hospitalizations, surgeries, and ER visits in previous 12 months . Vitals . Screenings to include cognitive, depression, and falls . Referrals and appointments  In addition, I have reviewed and discussed with patient certain preventive protocols, quality metrics, and best practice recommendations. A written personalized care plan for preventive services as well as general preventive health recommendations were provided to patient.     Andrez Grime, LPN   7/49/3552

## 2017-09-08 DIAGNOSIS — H26492 Other secondary cataract, left eye: Secondary | ICD-10-CM | POA: Diagnosis not present

## 2017-09-08 DIAGNOSIS — H01024 Squamous blepharitis left upper eyelid: Secondary | ICD-10-CM | POA: Diagnosis not present

## 2017-09-08 DIAGNOSIS — Z961 Presence of intraocular lens: Secondary | ICD-10-CM | POA: Diagnosis not present

## 2017-09-08 DIAGNOSIS — H10413 Chronic giant papillary conjunctivitis, bilateral: Secondary | ICD-10-CM | POA: Diagnosis not present

## 2017-09-08 DIAGNOSIS — H01025 Squamous blepharitis left lower eyelid: Secondary | ICD-10-CM | POA: Diagnosis not present

## 2017-09-08 DIAGNOSIS — H01022 Squamous blepharitis right lower eyelid: Secondary | ICD-10-CM | POA: Diagnosis not present

## 2017-09-08 DIAGNOSIS — H26491 Other secondary cataract, right eye: Secondary | ICD-10-CM | POA: Diagnosis not present

## 2017-09-08 DIAGNOSIS — H0264 Xanthelasma of left upper eyelid: Secondary | ICD-10-CM | POA: Diagnosis not present

## 2017-09-08 DIAGNOSIS — H01021 Squamous blepharitis right upper eyelid: Secondary | ICD-10-CM | POA: Diagnosis not present

## 2017-10-13 ENCOUNTER — Other Ambulatory Visit: Payer: Self-pay | Admitting: Family Medicine

## 2017-10-21 ENCOUNTER — Telehealth: Payer: Self-pay | Admitting: Family Medicine

## 2017-10-21 ENCOUNTER — Encounter: Payer: Self-pay | Admitting: Family Medicine

## 2017-10-21 ENCOUNTER — Ambulatory Visit (INDEPENDENT_AMBULATORY_CARE_PROVIDER_SITE_OTHER): Payer: PPO | Admitting: Family Medicine

## 2017-10-21 VITALS — BP 140/82 | HR 100 | Temp 97.8°F | Resp 16 | Ht 63.39 in | Wt 243.0 lb

## 2017-10-21 DIAGNOSIS — M5431 Sciatica, right side: Secondary | ICD-10-CM

## 2017-10-21 DIAGNOSIS — M79604 Pain in right leg: Secondary | ICD-10-CM

## 2017-10-21 MED ORDER — IBUPROFEN 600 MG PO TABS: 600.0000 mg | ORAL_TABLET | Freq: Three times a day (TID) | ORAL | 0 refills | Status: AC | PRN

## 2017-10-21 MED ORDER — CYCLOBENZAPRINE HCL 5 MG PO TABS: ORAL_TABLET | ORAL | 0 refills | Status: DC

## 2017-10-21 NOTE — Telephone Encounter (Signed)
Pt was just seen by dr Carlota Raspberry and [place] has a question regarding the flexeril   Best number to [facility] is [phone]

## 2017-10-21 NOTE — Telephone Encounter (Signed)
Pharmacy advised  

## 2017-10-21 NOTE — Patient Instructions (Addendum)
Try the ibuprofen up to every 6 hours with food as needed, heat or ice and gentle stretches. Muscle relaxant if needed, but that can cause sedation - be careful combining with gabapentin.   If pain not continuing to improve this week, follow up early next week for possible xrays. Return to the clinic or go to the nearest emergency room if any of your symptoms worsen or new symptoms occur.   Sciatica Sciatica is pain, numbness, weakness, or tingling along the path of the sciatic nerve. The sciatic nerve starts in the lower back and runs down the back of each leg. The nerve controls the muscles in the lower leg and in the back of the knee. It also provides feeling (sensation) to the back of the thigh, the lower leg, and the sole of the foot. Sciatica is a symptom of another medical condition that pinches or puts pressure on the sciatic nerve. Generally, sciatica only affects one side of the body. Sciatica usually goes away on its own or with treatment. In some cases, sciatica may keep coming back (recur). What are the causes? This condition is caused by pressure on the sciatic nerve, or pinching of the sciatic nerve. This may be the result of:  A disk in between the bones of the spine (vertebrae) bulging out too far (herniated disk).  Age-related changes in the spinal disks (degenerative disk disease).  A pain disorder that affects a muscle in the buttock (piriformis syndrome).  Extra bone growth (bone spur) near the sciatic nerve.  An injury or break (fracture) of the pelvis.  Pregnancy.  Tumor (rare).  What increases the risk? The following factors may make you more likely to develop this condition:  Playing sports that place pressure or stress on the spine, such as football or weight lifting.  Having poor strength and flexibility.  A history of back injury.  A history of back surgery.  Sitting for long periods of time.  Doing activities that involve repetitive bending or  lifting.  Obesity.  What are the signs or symptoms? Symptoms can vary from mild to very severe, and they may include:  Any of these problems in the lower back, leg, hip, or buttock: ? Mild tingling or dull aches. ? Burning sensations. ? Sharp pains.  Numbness in the back of the calf or the sole of the foot.  Leg weakness.  Severe back pain that makes movement difficult.  These symptoms may get worse when you cough, sneeze, or laugh, or when you sit or stand for long periods of time. Being overweight may also make symptoms worse. In some cases, symptoms may recur over time. How is this diagnosed? This condition may be diagnosed based on:  Your symptoms.  A physical exam. Your health care provider may ask you to do certain movements to check whether those movements trigger your symptoms.  You may have tests, including: ? Blood tests. ? X-rays. ? MRI. ? CT scan.  How is this treated? In many cases, this condition improves on its own, without any treatment. However, treatment may include:  Reducing or modifying physical activity during periods of pain.  Exercising and stretching to strengthen your abdomen and improve the flexibility of your spine.  Icing and applying heat to the affected area.  Medicines that help: ? To relieve pain and swelling. ? To relax your muscles.  Injections of medicines that help to relieve pain, irritation, and inflammation around the sciatic nerve (steroids).  Surgery.  Follow these instructions  at home: Medicines  Take over-the-counter and prescription medicines only as told by your health care provider.  Do not drive or operate heavy machinery while taking prescription pain medicine. Managing pain  If directed, apply ice to the affected area. ? Put ice in a plastic bag. ? Place a towel between your skin and the bag. ? Leave the ice on for 20 minutes, 2-3 times a day.  After icing, apply heat to the affected area before you  exercise or as often as told by your health care provider. Use the heat source that your health care provider recommends, such as a moist heat pack or a heating pad. ? Place a towel between your skin and the heat source. ? Leave the heat on for 20-30 minutes. ? Remove the heat if your skin turns bright red. This is especially important if you are unable to feel pain, heat, or cold. You may have a greater risk of getting burned. Activity  Return to your normal activities as told by your health care provider. Ask your health care provider what activities are safe for you. ? Avoid activities that make your symptoms worse.  Take brief periods of rest throughout the day. Resting in a lying or standing position is usually better than sitting to rest. ? When you rest for longer periods, mix in some mild activity or stretching between periods of rest. This will help to prevent stiffness and pain. ? Avoid sitting for long periods of time without moving. Get up and move around at least one time each hour.  Exercise and stretch regularly, as told by your health care provider.  Do not lift anything that is heavier than 10 lb (4.5 kg) while you have symptoms of sciatica. When you do not have symptoms, you should still avoid heavy lifting, especially repetitive heavy lifting.  When you lift objects, always use proper lifting technique, which includes: ? Bending your knees. ? Keeping the load close to your body. ? Avoiding twisting. General instructions  Use good posture. ? Avoid leaning forward while sitting. ? Avoid hunching over while standing.  Maintain a healthy weight. Excess weight puts extra stress on your back and makes it difficult to maintain good posture.  Wear supportive, comfortable shoes. Avoid wearing high heels.  Avoid sleeping on a mattress that is too soft or too hard. A mattress that is firm enough to support your back when you sleep may help to reduce your pain.  Keep all  follow-up visits as told by your health care provider. This is important. Contact a health care provider if:  You have pain that wakes you up when you are sleeping.  You have pain that gets worse when you lie down.  Your pain is worse than you have experienced in the past.  Your pain lasts longer than 4 weeks.  You experience unexplained weight loss. Get help right away if:  You lose control of your bowel or bladder (incontinence).  You have: ? Weakness in your lower back, pelvis, buttocks, or legs that gets worse. ? Redness or swelling of your back. ? A burning sensation when you urinate. This information is not intended to replace advice given to you by your health care provider. Make sure you discuss any questions you have with your health care provider. Document Released: 12/03/2001 Document Revised: 05/14/2016 Document Reviewed: 08/18/2015 Elsevier Interactive Patient Education  2017 [employer].    IF you received an x-ray today, you will receive an invoice from [facility]. Please contact [facility] at [phone] with questions or concerns regarding your invoice.   IF you received labwork today, you will receive an invoice from [facility]. Please contact [facility] at [phone] with questions or concerns regarding your invoice.   Our billing staff will not be able to assist you with questions regarding bills from these companies.  You will be contacted with the lab results as soon as they are available. The fastest way to get your results is to activate your My Chart account. Instructions are located on the last page of this paperwork. If you have not heard from [place] regarding the results in 2 weeks, please contact this office.

## 2017-10-21 NOTE — Progress Notes (Signed)
Subjective:  By signing my name below, I, Essence Howell, attest that this documentation has been prepared under the direction and in the presence of Wendie Agreste, MD Electronically Signed: Ladene Artist, ED Scribe 10/21/2017 at 12:02 PM.   Patient ID: Veronica Martinez, female    DOB: 05/19/1956, 61 y.o.   MRN: [id number]  Chief Complaint  Patient presents with  . Hip Pain    left side x 2 days   . Knee Pain    left side x 2 days    HPI Veronica Martinez is a 61 y.o. female who presents to Primary Care at [facility] complaining of R hip and knee pain x 2 days. She has had h/o suspected meralgia paresthetica treated with gabapentin 600 mg tid. Did have h/o arthritis in her low back. Pt noticed sudden onset of R hip/buttock pain and R knee pain onset yesterday while walking around [employer]. No known fall, specific twist or turn but reports recent squatting and cleaning since moving into a new home 2 months ago. States throbbing R knee pain radiates into her R ankle and has noticed heaviness into her R knee since this morning, however, she reports that pain has improved today. She has tried 2 Tylenol arthritis last night and Gabapentin bid with minimal relief. Pt has also tried ice which she states worsened pain. Also reports night sweats over the past 2 nights. Denies fever, unexplained weight loss, back pain, bladder/bowel incontinence, difficulty urinating, saddle paraesthesia, weakness in LE. Pt had a MRI in 2008 with facet degeneration. Had a lumbar XR in July 2011 showing normal alignment, no acute bony findings and mild to moderate facet degenerate changes.  Patient Active Problem List   Diagnosis Date Noted  . 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 Right hip pain 08/09/2013  . Trigger finger, acquired 04/22/2013  . Carpal tunnel syndrome of right wrist 04/22/2013  . Pain of right heel 12/08/2012  . Myalgia 11/10/2012  . OBESITY,  UNSPECIFIED 02/20/2011  . BACK PAIN, LUMBAR 07/05/2010  . ABNORMALITY OF GAIT 05/02/2010  . MERALGIA PARESTHETICA 03/02/2010  . HIP PAIN, BILATERAL 03/02/2010   Past Medical History:  Diagnosis Date  . Allergy   . GERD (gastroesophageal reflux disease)   . Headache(784.0)    sinus  . High cholesterol   . History of Graves' disease   . Hx of radioactive iodine thyroid ablation   . Hypothyroidism    Past Surgical History:  Procedure Laterality Date  . ABDOMINAL HYSTERECTOMY    . APPENDECTOMY    . CATARACT EXTRACTION W/PHACO Right 09/22/2013   Procedure: CATARACT EXTRACTION PHACO AND INTRAOCULAR LENS PLACEMENT (IOC);  Surgeon: [name], MD;  Location: [place];  Service: Ophthalmology;  Laterality: Right;  . CATARACT EXTRACTION W/PHACO Left 12/29/2013   Procedure: CATARACT EXTRACTION PHACO AND INTRAOCULAR LENS PLACEMENT (IOC);  Surgeon: [name], MD;  Location: [place];  Service: Ophthalmology;  Laterality: Left;  . COLONOSCOPY    . EYE SURGERY    . HIP ARTHROPLASTY Bilateral   . JOINT REPLACEMENT    . OTHER SURGICAL HISTORY Right    carpal tunel injection  . SHOULDER SURGERY    . TONSILLECTOMY     Allergies  Allergen Reactions  . Meloxicam Other (See Comments)    Causes Bruising   . Pravastatin Sodium Other (See Comments)    Bruises  . Simvastatin Other (See Comments)    Bruises   .  Tramadol Hcl Other (See Comments)    Bruising    Prior to Admission medications   Medication Sig Start Date End Date Taking? Authorizing Provider  Cholecalciferol (VITAMIN D) 2000 UNITS CAPS Take 2,000 Units by mouth daily.    Yes [provider]  Coenzyme Q10 (CO Q 10 PO) Take 75 mg by mouth 3 (three) times daily.   Yes [provider]  cyanocobalamin 2000 MCG tablet Take 2,000 mcg by mouth daily. Vitamin B12   Yes [provider]  fluticasone (FLONASE) 50 MCG/ACT nasal spray INHALE 2 PUFFS INTO EACH NOSTRIL EVERY DAY 05/24/17  Yes Wendie Agreste, MD    gabapentin (NEURONTIN) 600 MG tablet Take 1 tablet (600 mg total) by mouth 3 (three) times daily. 06/24/17  Yes Wendie Agreste, MD  Guaifenesin [facility] MAXIMUM STRENGTH) 1200 MG TB12 Take 1 tablet (1,200 mg total) by mouth every 12 (twelve) hours as needed. 05/10/16  Yes English, Colletta [place] D, PA  ibuprofen (ADVIL,MOTRIN) 600 MG tablet TAKE 1 TABLET BY MOUTH EVERY 6 TO 8 HOURS AS NEEDED FOR PAIN 03/25/16  Yes [provider]  Misc Natural Products (CALCIUM PLUS ADVANCED PO) Take 600 mg by mouth 2 (two) times daily.    Yes [provider]  Multiple Vitamins-Minerals (CENTRUM SILVER PO) Take by mouth.   Yes [provider]  Omega-3 Fatty Acids (FISH OIL) 1200 MG CAPS Take 1,200 mg by mouth 3 (three) times daily.    Yes [provider]  pantoprazole (PROTONIX) 40 MG tablet Take 1 tablet (40 mg total) by mouth daily. 01/16/17  Yes Wendie Agreste, MD  rosuvastatin (CRESTOR) 5 MG tablet TAKE 1 TABLET(5 MG) BY MOUTH DAILY AT 6 PM 10/15/17  Yes Wendie Agreste, MD  SYNTHROID 100 MCG tablet Take 1 tablet (100 mcg total) by mouth daily before breakfast. 01/16/17  Yes Wendie Agreste, MD  vitamin E 400 UNIT capsule Take 400 Units by mouth daily.   Yes [provider]   Social History   Social History  . Marital status: Married    Spouse name: N/A  . Number of children: N/A  . Years of education: N/A   Occupational History  . Not on file.   Social History Main Topics  . Smoking status: Never Smoker  . Smokeless tobacco: Never Used  . Alcohol use No  . Drug use: No  . Sexual activity: Yes    Partners: Male   Other Topics Concern  . Not on file   Social History Narrative   Married. Education: [employer].    Review of Systems  Constitutional: Positive for diaphoresis. Negative for fever and unexpected weight change.  Genitourinary: Negative for difficulty urinating and enuresis.  Musculoskeletal: Positive for arthralgias. Negative for back  pain.  Neurological: Negative for weakness and numbness.      Objective:   Physical Exam  Constitutional: She is oriented to person, place, and time. She appears well-developed and well-nourished. No distress.  HENT:  Head: Normocephalic and atraumatic.  Eyes: Conjunctivae and EOM are normal.  Neck: Neck supple. No tracheal deviation present.  Cardiovascular: Normal rate.   Pulmonary/Chest: Effort normal. No respiratory distress.  Musculoskeletal: Normal range of motion.  Tenderness along R sciatic notch. LS spine is nontender. Able to heel and toe walk without difficulty. Intact ROM at lumbar spine. Negative seated straight leg raise. Pain free ROM of R knee. No appreciable effusion. Minimal tenderness along joint lines anteriorly, both medial and lateral. Negative varus  and valgus.  Neurological: She is alert and oriented to person, place, and time. She displays no Babinski's sign on the right side. She displays no Babinski's sign on the left side.  Reflex Scores:      Patellar reflexes are 2+ on the right side and 2+ on the left side.      Achilles reflexes are 2+ on the right side and 2+ on the left side. Skin: Skin is warm and dry.  Psychiatric: She has a normal mood and affect. Her behavior is normal.  Nursing note and vitals reviewed.  Vitals:   10/21/17 1154  BP: 140/82  Pulse: 100  Resp: 16  Temp: 97.8 F (36.6 C)  TempSrc: Oral  SpO2: 98%  Weight: 243 lb (110.2 kg)  Height: 5' 3.39" (1.61 m)      Assessment & Plan:    Veronica Martinez is a 61 y.o. female Right sided sciatica - Plan: ibuprofen (ADVIL,MOTRIN) 600 MG tablet, cyclobenzaprine (FLEXERIL) 5 MG tablet  Right leg pain  2 day history of pain, no known injury. Appears to be typical sciatica distribution. May have started after more repetitive bending with new home and cleaning. No red flags on exam, did admit to some night sweats past few nights, but afebrile, and otherwise reassuring exam and  history.  -Symptomatic care discussed, ibuprofen 600 mg every 6-8 hours as needed with food. She has tolerated that medication in the past without easy bruising or other side effects.  -Flexeril prescribed if needed, but cautioned on sedation, including with combination with gabapentin.  -Recheck if not continuing to improve this week for possible imaging. Sooner if worse  Meds ordered this encounter  Medications  . ibuprofen (ADVIL,MOTRIN) 600 MG tablet    Sig: Take 1 tablet (600 mg total) by mouth every 8 (eight) hours as needed (with food).    Dispense:  30 tablet    Refill:  0  . cyclobenzaprine (FLEXERIL) 5 MG tablet    Sig: 1 pill by mouth up to every 8 hours as needed. Start with one pill by mouth each bedtime as needed due to sedation    Dispense:  15 tablet    Refill:  0   Patient Instructions   Try the ibuprofen up to every 6 hours with food as needed, heat or ice and gentle stretches. Muscle relaxant if needed, but that can cause sedation - be careful combining with gabapentin.   If pain not continuing to improve this week, follow up early next week for possible xrays. Return to the clinic or go to the nearest emergency room if any of your symptoms worsen or new symptoms occur.   Sciatica Sciatica is pain, numbness, weakness, or tingling along the path of the sciatic nerve. The sciatic nerve starts in the lower back and runs down the back of each leg. The nerve controls the muscles in the lower leg and in the back of the knee. It also provides feeling (sensation) to the back of the thigh, the lower leg, and the sole of the foot. Sciatica is a symptom of another medical condition that pinches or puts pressure on the sciatic nerve. Generally, sciatica only affects one side of the body. Sciatica usually goes away on its own or with treatment. In some cases, sciatica may keep coming back (recur). What are the causes? This condition is caused by pressure on the sciatic nerve, or  pinching of the sciatic nerve. This may be the result of:  A disk  in between the bones of the spine (vertebrae) bulging out too far (herniated disk).  Age-related changes in the spinal disks (degenerative disk disease).  A pain disorder that affects a muscle in the buttock (piriformis syndrome).  Extra bone growth (bone spur) near the sciatic nerve.  An injury or break (fracture) of the pelvis.  Pregnancy.  Tumor (rare).  What increases the risk? The following factors may make you more likely to develop this condition:  Playing sports that place pressure or stress on the spine, such as football or weight lifting.  Having poor strength and flexibility.  A history of back injury.  A history of back surgery.  Sitting for long periods of time.  Doing activities that involve repetitive bending or lifting.  Obesity.  What are the signs or symptoms? Symptoms can vary from mild to very severe, and they may include:  Any of these problems in the lower back, leg, hip, or buttock: ? Mild tingling or dull aches. ? Burning sensations. ? Sharp pains.  Numbness in the back of the calf or the sole of the foot.  Leg weakness.  Severe back pain that makes movement difficult.  These symptoms may get worse when you cough, sneeze, or laugh, or when you sit or stand for long periods of time. Being overweight may also make symptoms worse. In some cases, symptoms may recur over time. How is this diagnosed? This condition may be diagnosed based on:  Your symptoms.  A physical exam. Your health care provider may ask you to do certain movements to check whether those movements trigger your symptoms.  You may have tests, including: ? Blood tests. ? X-rays. ? MRI. ? CT scan.  How is this treated? In many cases, this condition improves on its own, without any treatment. However, treatment may include:  Reducing or modifying physical activity during periods of pain.  Exercising and  stretching to strengthen your abdomen and improve the flexibility of your spine.  Icing and applying heat to the affected area.  Medicines that help: ? To relieve pain and swelling. ? To relax your muscles.  Injections of medicines that help to relieve pain, irritation, and inflammation around the sciatic nerve (steroids).  Surgery.  Follow these instructions at home: Medicines  Take over-the-counter and prescription medicines only as told by your health care provider.  Do not drive or operate heavy machinery while taking prescription pain medicine. Managing pain  If directed, apply ice to the affected area. ? Put ice in a plastic bag. ? Place a towel between your skin and the bag. ? Leave the ice on for 20 minutes, 2-3 times a day.  After icing, apply heat to the affected area before you exercise or as often as told by your health care provider. Use the heat source that your health care provider recommends, such as a moist heat pack or a heating pad. ? Place a towel between your skin and the heat source. ? Leave the heat on for 20-30 minutes. ? Remove the heat if your skin turns bright red. This is especially important if you are unable to feel pain, heat, or cold. You may have a greater risk of getting burned. Activity  Return to your normal activities as told by your health care provider. Ask your health care provider what activities are safe for you. ? Avoid activities that make your symptoms worse.  Take brief periods of rest throughout the day. Resting in a lying or standing position is usually  better than sitting to rest. ? When you rest for longer periods, mix in some mild activity or stretching between periods of rest. This will help to prevent stiffness and pain. ? Avoid sitting for long periods of time without moving. Get up and move around at least one time each hour.  Exercise and stretch regularly, as told by your health care provider.  Do not lift anything that  is heavier than 10 lb (4.5 kg) while you have symptoms of sciatica. When you do not have symptoms, you should still avoid heavy lifting, especially repetitive heavy lifting.  When you lift objects, always use proper lifting technique, which includes: ? Bending your knees. ? Keeping the load close to your body. ? Avoiding twisting. General instructions  Use good posture. ? Avoid leaning forward while sitting. ? Avoid hunching over while standing.  Maintain a healthy weight. Excess weight puts extra stress on your back and makes it difficult to maintain good posture.  Wear supportive, comfortable shoes. Avoid wearing high heels.  Avoid sleeping on a mattress that is too soft or too hard. A mattress that is firm enough to support your back when you sleep may help to reduce your pain.  Keep all follow-up visits as told by your health care provider. This is important. Contact a health care provider if:  You have pain that wakes you up when you are sleeping.  You have pain that gets worse when you lie down.  Your pain is worse than you have experienced in the past.  Your pain lasts longer than 4 weeks.  You experience unexplained weight loss. Get help right away if:  You lose control of your bowel or bladder (incontinence).  You have: ? Weakness in your lower back, pelvis, buttocks, or legs that gets worse. ? Redness or swelling of your back. ? A burning sensation when you urinate. This information is not intended to replace advice given to you by your health care provider. Make sure you discuss any questions you have with your health care provider. Document Released: 12/03/2001 Document Revised: 05/14/2016 Document Reviewed: 08/18/2015 Elsevier Interactive Patient Education  2017 [employer].    IF you received an x-ray today, you will receive an invoice from [facility]. Please contact [facility] at [phone] with questions or concerns regarding your  invoice.   IF you received labwork today, you will receive an invoice from [facility]. Please contact [facility] at [phone] with questions or concerns regarding your invoice.   Our billing staff will not be able to assist you with questions regarding bills from these companies.  You will be contacted with the lab results as soon as they are available. The fastest way to get your results is to activate your My Chart account. Instructions are located on the last page of this paperwork. If you have not heard from [place] regarding the results in 2 weeks, please contact this office.       I personally performed the services described in this documentation, which was scribed in my presence. The recorded information has been reviewed and considered for accuracy and completeness, addended by me as needed, and agree with information above.  Signed,   [name], MD [facility] at [place].  10/21/17 12:59 PM

## 2018-01-12 ENCOUNTER — Other Ambulatory Visit: Payer: Self-pay | Admitting: Family Medicine

## 2018-01-12 NOTE — Telephone Encounter (Signed)
Pt needs a lipid panel done before the rosuvastatin can be refilled. Last lipid panel was 01/16/17.  Criteria is every 6 months for  Cholesterol medication.

## 2018-01-13 NOTE — Telephone Encounter (Signed)
Medication refill

## 2018-01-19 ENCOUNTER — Other Ambulatory Visit: Payer: Self-pay | Admitting: Family Medicine

## 2018-01-19 DIAGNOSIS — M792 Neuralgia and neuritis, unspecified: Secondary | ICD-10-CM

## 2018-01-28 DIAGNOSIS — H01025 Squamous blepharitis left lower eyelid: Secondary | ICD-10-CM | POA: Diagnosis not present

## 2018-01-28 DIAGNOSIS — H0264 Xanthelasma of left upper eyelid: Secondary | ICD-10-CM | POA: Diagnosis not present

## 2018-01-28 DIAGNOSIS — H01022 Squamous blepharitis right lower eyelid: Secondary | ICD-10-CM | POA: Diagnosis not present

## 2018-01-28 DIAGNOSIS — H26491 Other secondary cataract, right eye: Secondary | ICD-10-CM | POA: Diagnosis not present

## 2018-01-28 DIAGNOSIS — H26492 Other secondary cataract, left eye: Secondary | ICD-10-CM | POA: Diagnosis not present

## 2018-01-28 DIAGNOSIS — H01021 Squamous blepharitis right upper eyelid: Secondary | ICD-10-CM | POA: Diagnosis not present

## 2018-01-28 DIAGNOSIS — H10413 Chronic giant papillary conjunctivitis, bilateral: Secondary | ICD-10-CM | POA: Diagnosis not present

## 2018-01-28 DIAGNOSIS — Z961 Presence of intraocular lens: Secondary | ICD-10-CM | POA: Diagnosis not present

## 2018-01-28 DIAGNOSIS — H01024 Squamous blepharitis left upper eyelid: Secondary | ICD-10-CM | POA: Diagnosis not present

## 2018-02-06 DIAGNOSIS — R1013 Epigastric pain: Secondary | ICD-10-CM | POA: Diagnosis not present

## 2018-02-06 DIAGNOSIS — K219 Gastro-esophageal reflux disease without esophagitis: Secondary | ICD-10-CM | POA: Diagnosis not present

## 2018-02-06 DIAGNOSIS — R110 Nausea: Secondary | ICD-10-CM | POA: Diagnosis not present

## 2018-02-19 ENCOUNTER — Encounter: Payer: Self-pay | Admitting: Family Medicine

## 2018-02-19 ENCOUNTER — Ambulatory Visit: Payer: PPO | Admitting: Family Medicine

## 2018-02-19 ENCOUNTER — Other Ambulatory Visit: Payer: Self-pay

## 2018-02-19 VITALS — BP 132/72 | HR 80 | Temp 98.7°F | Resp 16 | Ht 63.78 in | Wt 243.0 lb

## 2018-02-19 DIAGNOSIS — J309 Allergic rhinitis, unspecified: Secondary | ICD-10-CM

## 2018-02-19 DIAGNOSIS — M792 Neuralgia and neuritis, unspecified: Secondary | ICD-10-CM | POA: Diagnosis not present

## 2018-02-19 DIAGNOSIS — Z79899 Other long term (current) drug therapy: Secondary | ICD-10-CM

## 2018-02-19 DIAGNOSIS — K219 Gastro-esophageal reflux disease without esophagitis: Secondary | ICD-10-CM

## 2018-02-19 DIAGNOSIS — E785 Hyperlipidemia, unspecified: Secondary | ICD-10-CM | POA: Diagnosis not present

## 2018-02-19 DIAGNOSIS — E039 Hypothyroidism, unspecified: Secondary | ICD-10-CM | POA: Diagnosis not present

## 2018-02-19 MED ORDER — ROSUVASTATIN CALCIUM 5 MG PO TABS: ORAL_TABLET | ORAL | 1 refills | Status: DC

## 2018-02-19 MED ORDER — GABAPENTIN 600 MG PO TABS: 600.0000 mg | ORAL_TABLET | Freq: Three times a day (TID) | ORAL | 1 refills | Status: DC

## 2018-02-19 MED ORDER — SYNTHROID 100 MCG PO TABS: 100.0000 ug | ORAL_TABLET | Freq: Every day | ORAL | 1 refills | Status: DC

## 2018-02-19 NOTE — Progress Notes (Signed)
Subjective:  By signing my name below, I, Veronica Martinez, attest that this documentation has been prepared under the direction and in the presence of Carlota Raspberry Ranell Patrick, MD.  Electronically Signed: Theresia Martinez, Medical Scribe 02/19/18 at 10:05 AM   Patient ID: Veronica Martinez, female    DOB: [birth year], 62 y.o.   MRN: [id number] Chief Complaint  Patient presents with  . Chronic Conditions    6 month follow-up   . Medication Refill   HPI Veronica Martinez is a 62 y.o. female who presents to Primary Care at [facility] for follow up. Today, she reports she is doing well overall.   Allergies She notes to have some nasal drainage and congestion. She uses Flonase nasal spray. She reports she uses two puffs daily. She has also added  Zyrtec daily. She notes she has been cleaning at home, around dust. She denies fever, CP, SOB or dizziness.   Hypothyroidism She takes Synthroid mcg QD and is compliant. She states she has felt more hot lately. She denies weight loss or hair loss.  Lab Results  Component Value Date   TSH 2.080 01/16/2017   HLD She takes Crestor 5 mg QD with no complaints or adverse side effects. .  Lab Results  Component Value Date   CHOL 188 01/16/2017   HDL 108 01/16/2017   LDLCALC 69 01/16/2017   TRIG 55 01/16/2017   CHOLHDL 1.7 01/16/2017   Lab Results  Component Value Date   ALT 20 05/10/2017   AST 23 05/10/2017   ALKPHOS 82 05/10/2017   BILITOT 0.3 05/10/2017   She reports she stopped taking Vitamin D OTC in 2018.   GERD She takes Protonix 40 mg BID per Dr. [name], her gastroenterologist.   Hx of Right leg pain Thought to be neuropathy or meralgia paresthetica. She takes gabapentin 2-3 times daily to improve her pain.     Patient Active Problem List   Diagnosis Date Noted  . 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 Right hip pain 08/09/2013  . Trigger finger, acquired 04/22/2013  . Carpal  tunnel syndrome of right wrist 04/22/2013  . Pain of right heel 12/08/2012  . Myalgia 11/10/2012  . OBESITY, UNSPECIFIED 02/20/2011  . BACK PAIN, LUMBAR 07/05/2010  . ABNORMALITY OF GAIT 05/02/2010  . MERALGIA PARESTHETICA 03/02/2010  . HIP PAIN, BILATERAL 03/02/2010   Past Medical History:  Diagnosis Date  . Allergy   . GERD (gastroesophageal reflux disease)   . Headache(784.0)    sinus  . High cholesterol   . History of Graves' disease   . Hx of radioactive iodine thyroid ablation   . Hypothyroidism    Past Surgical History:  Procedure Laterality Date  . ABDOMINAL HYSTERECTOMY    . APPENDECTOMY    . CATARACT EXTRACTION W/PHACO Right 09/22/2013   Procedure: CATARACT EXTRACTION PHACO AND INTRAOCULAR LENS PLACEMENT (IOC);  Surgeon: [name], MD;  Location: [facility];  Service: Ophthalmology;  Laterality: Right;  . CATARACT EXTRACTION W/PHACO Left 12/29/2013   Procedure: CATARACT EXTRACTION PHACO AND INTRAOCULAR LENS PLACEMENT (IOC);  Surgeon: [name], MD;  Location: [place];  Service: Ophthalmology;  Laterality: Left;  . COLONOSCOPY    . EYE SURGERY    . HIP ARTHROPLASTY Bilateral   . JOINT REPLACEMENT    . OTHER SURGICAL HISTORY Right    carpal tunel injection  . SHOULDER SURGERY    . TONSILLECTOMY     Allergies  Allergen Reactions  . Meloxicam Other (See Comments)    Causes Bruising   . Pravastatin Sodium Other (See Comments)    Bruises  . Simvastatin Other (See Comments)    Bruises   . Tramadol Hcl Other (See Comments)    Bruising    Prior to Admission medications   Medication Sig Start Date End Date Taking? Authorizing Provider  Cholecalciferol (VITAMIN D) 2000 UNITS CAPS Take 2,000 Units by mouth daily.    Yes [provider]  Coenzyme Q10 (CO Q 10 PO) Take 75 mg by mouth 3 (three) times daily.   Yes [provider]  cyanocobalamin 2000 MCG tablet Take 2,000 mcg by mouth daily. Vitamin B12   Yes [provider]  cyclobenzaprine  (FLEXERIL) 5 MG tablet 1 pill by mouth up to every 8 hours as needed. Start with one pill by mouth each bedtime as needed due to sedation 10/21/17  Yes Wendie Agreste, MD  fluticasone [facility]) 50 MCG/ACT nasal spray INHALE 2 PUFFS INTO EACH NOSTRIL EVERY DAY 05/24/17  Yes Wendie Agreste, MD  gabapentin (NEURONTIN) 600 MG tablet Take 1 tablet (600 mg total) by mouth 3 (three) times daily. 06/24/17  Yes Wendie Agreste, MD  Guaifenesin [facility] MAXIMUM STRENGTH) 1200 MG TB12 Take 1 tablet (1,200 mg total) by mouth every 12 (twelve) hours as needed. 05/10/16  Yes English, Colletta Maryland D, PA  ibuprofen (ADVIL,MOTRIN) 600 MG tablet Take 1 tablet (600 mg total) by mouth every 8 (eight) hours as needed (with food). 10/21/17  Yes Wendie Agreste, MD  Misc Natural Products (CALCIUM PLUS ADVANCED PO) Take 600 mg by mouth 2 (two) times daily.    Yes [provider]  Multiple Vitamins-Minerals (CENTRUM SILVER PO) Take by mouth.   Yes [provider]  Omega-3 Fatty Acids (FISH OIL) 1200 MG CAPS Take 1,200 mg by mouth 3 (three) times daily.    Yes [provider]  pantoprazole (PROTONIX) 40 MG tablet Take 1 tablet (40 mg total) by mouth daily. 01/16/17  Yes Wendie Agreste, MD  rosuvastatin (CRESTOR) 5 MG tablet TAKE 1 TABLET(5 MG) BY MOUTH DAILY AT 6 PM 01/13/18  Yes Wendie Agreste, MD  SYNTHROID 100 MCG tablet Take 1 tablet (100 mcg total) by mouth daily before breakfast. 01/16/17  Yes Wendie Agreste, MD  vitamin E 400 UNIT capsule Take 400 Units by mouth daily.   Yes [provider]  Zoster Vaccine Live, PF, (ZOSTAVAX) 56213 UNT/0.65ML injection Inject into the skin. 12/26/15  Yes [provider]   Social History   Socioeconomic History  . Marital status: Married    Spouse name: Not on file  . Number of children: Not on file  . Years of education: Not on file  . Highest education level: Not on file  Social Needs  . Financial resource strain: Not on file    . Food insecurity - worry: Not on file  . Food insecurity - inability: Not on file  . Transportation needs - medical: Not on file  . Transportation needs - non-medical: Not on file  Occupational History  . Not on file  Tobacco Use  . Smoking status: Never Smoker  . Smokeless tobacco: Never Used  Substance and Sexual Activity  . Alcohol use: No  . Drug use: No  . Sexual activity: Yes    Partners: Male  Other Topics Concern  . Not on file  Social History Narrative   Married. Education: [employer].  Review of Systems  Constitutional: Negative for fatigue, fever and unexpected weight change.       (-) hair loss, (+) hot flash  HENT: Positive for congestion.        (+) nasal drainage  Respiratory: Negative for chest tightness and shortness of breath.   Cardiovascular: Negative for chest pain, palpitations and leg swelling.  Gastrointestinal: Negative for abdominal pain and blood in stool.  Neurological: Negative for dizziness, syncope, light-headedness and headaches.      Objective:   Physical Exam  Constitutional: She is oriented to person, place, and time. She appears well-developed and well-nourished. No distress.  HENT:  Head: Normocephalic and atraumatic.  Eyes: Conjunctivae and EOM are normal. Pupils are equal, round, and reactive to light. No scleral icterus.  Neck: Neck supple. Carotid bruit is not present.  Cardiovascular: Normal rate, regular rhythm, normal heart sounds and intact distal pulses. Exam reveals no gallop and no friction rub.  No murmur heard. Pulmonary/Chest: Effort normal and breath sounds normal. No respiratory distress. She has no wheezes. She has no rales.  Abdominal: Soft. She exhibits no pulsatile midline mass. There is no tenderness.  Neurological: She is alert and oriented to person, place, and time.  Skin: Skin is warm and dry. She is not diaphoretic.  Psychiatric: She has a normal mood and affect. Her behavior is normal.  Vitals  reviewed.   Vitals:   02/19/18 0927  BP: 132/72  Pulse: 80  Resp: 16  Temp: 98.7 F (37.1 C)  TempSrc: Oral  SpO2: 96%  Weight: 243 lb (110.2 kg)  Height: 5' 3.78" (1.62 m)      Assessment & Plan:   Veronica Martinez is a 62 y.o. female Gastroesophageal reflux disease, esophagitis presence not specified - Plan: Vitamin B12, VITAMIN D 25 Hydroxy (Vit-D Deficiency, Fractures), Ferritin, Magnesium Use of proton pump inhibitor therapy - Plan: Vitamin B12, VITAMIN D 25 Hydroxy (Vit-D Deficiency, Fractures), Ferritin, Magnesium  - Monitor for deficiencies given daily need for PPI. Continue treatment as recommended by gastroenterology  Nerve pain - Plan: gabapentin (NEURONTIN) 600 MG tablet  Stable with gabapentin. Continue same dose, RTC precautions if worsening  Hypothyroidism, unspecified type - Plan: TSH, SYNTHROID 100 MCG tablet  - Tolerating Synthroid current dose, check TSH  Allergic rhinitis, unspecified seasonality, unspecified trigger  -Avoidance measures discussed with dust mask, continue Flonase nasal spray, Zyrtec daily, handout given with RTC precautions  Hyperlipidemia, unspecified hyperlipidemia type - Plan: Lipid panel, Comprehensive metabolic panel, rosuvastatin (CRESTOR) 5 MG tablet  -Tolerating Crestor at current dose, lipid panel, CMP pending.  Meds ordered this encounter  Medications  . SYNTHROID 100 MCG tablet    Sig: Take 1 tablet (100 mcg total) by mouth daily before breakfast.    Dispense:  90 tablet    Refill:  1  . rosuvastatin (CRESTOR) 5 MG tablet    Sig: 1TAKE 1 TABLET(5 MG) BY MOUTH DAILY AT 6 PM    Dispense:  90 tablet    Refill:  1  . gabapentin (NEURONTIN) 600 MG tablet    Sig: Take 1 tablet (600 mg total) by mouth 3 (three) times daily.    Dispense:  270 tablet    Refill:  1    Please consider 90 day supplies to promote better adherence   Patient Instructions   See info on allergies. Continue Zyrtec once per day, flonase nasal spray. Use  dust mask with cleaning or around dust at home if needed.   I will  check some labs, but no change in medicines for now. Continue gabapentin as needed, up to 3 times per day.  Follow-up in 6 months. Let me know if there are questions prior.  Allergic Rhinitis, Adult Allergic rhinitis is an allergic reaction that affects the mucous membrane inside the nose. It causes sneezing, a runny or stuffy nose, and the feeling of mucus going down the back of the throat (postnasal drip). Allergic rhinitis can be mild to severe. There are two types of allergic rhinitis:  Seasonal. This type is also called hay fever. It happens only during certain seasons.  Perennial. This type can happen at any time of the year.  What are the causes? This condition happens when the body's defense system (immune system) responds to certain harmless substances called allergens as though they were germs.  Seasonal allergic rhinitis is triggered by pollen, which can come from grasses, trees, and weeds. Perennial allergic rhinitis may be caused by:  House dust mites.  Pet dander.  Mold spores.  What are the signs or symptoms? Symptoms of this condition include:  Sneezing.  Runny or stuffy nose (nasal congestion).  Postnasal drip.  Itchy nose.  Tearing of the eyes.  Trouble sleeping.  Daytime sleepiness.  How is this diagnosed? This condition may be diagnosed based on:  Your medical history.  A physical exam.  Tests to check for related conditions, such as: ? Asthma. ? Pink eye. ? Ear infection. ? Upper respiratory infection.  Tests to find out which allergens trigger your symptoms. These may include skin or blood tests.  How is this treated? There is no cure for this condition, but treatment can help control symptoms. Treatment may include:  Taking medicines that block allergy symptoms, such as antihistamines. Medicine may be given as a shot, nasal spray, or pill.  Avoiding the  allergen.  Desensitization. This treatment involves getting ongoing shots until your body becomes less sensitive to the allergen. This treatment may be done if other treatments do not help.  If taking medicine and avoiding the allergen does not work, new, stronger medicines may be prescribed.  Follow these instructions at home:  Find out what you are allergic to. Common allergens include smoke, dust, and pollen.  Avoid the things you are allergic to. These are some things you can do to help avoid allergens: ? Replace carpet with wood, tile, or vinyl flooring. Carpet can trap dander and dust. ? Do not smoke. Do not allow smoking in your home. ? Change your heating and air conditioning filter at least once a month. ? During allergy season:  Keep windows closed as much as possible.  Plan outdoor activities when pollen counts are lowest. This is usually during the evening hours.  When coming indoors, change clothing and shower before sitting on furniture or bedding.  Take over-the-counter and prescription medicines only as told by your health care provider.  Keep all follow-up visits as told by your health care provider. This is important. Contact a health care provider if:  You have a fever.  You develop a persistent cough.  You make whistling sounds when you breathe (you wheeze).  Your symptoms interfere with your normal daily activities. Get help right away if:  You have shortness of breath. Summary  This condition can be managed by taking medicines as directed and avoiding allergens.  Contact your health care provider if you develop a persistent cough or fever.  During allergy season, keep windows closed as much as possible. This  information is not intended to replace advice given to you by your health care provider. Make sure you discuss any questions you have with your health care provider. Document Released: 09/03/2001 Document Revised: 01/16/2017 Document Reviewed:  01/16/2017 Elsevier Interactive Patient Education  2018 [employer].      IF you received an x-ray today, you will receive an invoice from [facility]. Please contact [facility] at [phone] with questions or concerns regarding your invoice.   IF you received labwork today, you will receive an invoice from [facility]. Please contact [facility] at [phone] with questions or concerns regarding your invoice.   Our billing staff will not be able to assist you with questions regarding bills from these companies.  You will be contacted with the lab results as soon as they are available. The fastest way to get your results is to activate your My Chart account. Instructions are located on the last page of this paperwork. If you have not heard from [place] regarding the results in 2 weeks, please contact this office.       I personally performed the services described in this documentation, which was scribed in my presence. The recorded information has been reviewed and considered for accuracy and completeness, addended by me as needed, and agree with information above.  Signed,   [name], MD Primary Care at [place].  02/19/18 12:53 PM

## 2018-02-19 NOTE — Patient Instructions (Addendum)
See info on allergies. Continue Zyrtec once per day, flonase nasal spray. Use dust mask with cleaning or around dust at home if needed.   I will check some labs, but no change in medicines for now. Continue gabapentin as needed, up to 3 times per day.  Follow-up in 6 months. Let me know if there are questions prior.  Allergic Rhinitis, Adult Allergic rhinitis is an allergic reaction that affects the mucous membrane inside the nose. It causes sneezing, a runny or stuffy nose, and the feeling of mucus going down the back of the throat (postnasal drip). Allergic rhinitis can be mild to severe. There are two types of allergic rhinitis:  Seasonal. This type is also called hay fever. It happens only during certain seasons.  Perennial. This type can happen at any time of the year.  What are the causes? This condition happens when the body's defense system (immune system) responds to certain harmless substances called allergens as though they were germs.  Seasonal allergic rhinitis is triggered by pollen, which can come from grasses, trees, and weeds. Perennial allergic rhinitis may be caused by:  House dust mites.  Pet dander.  Mold spores.  What are the signs or symptoms? Symptoms of this condition include:  Sneezing.  Runny or stuffy nose (nasal congestion).  Postnasal drip.  Itchy nose.  Tearing of the eyes.  Trouble sleeping.  Daytime sleepiness.  How is this diagnosed? This condition may be diagnosed based on:  Your medical history.  A physical exam.  Tests to check for related conditions, such as: ? Asthma. ? Pink eye. ? Ear infection. ? Upper respiratory infection.  Tests to find out which allergens trigger your symptoms. These may include skin or blood tests.  How is this treated? There is no cure for this condition, but treatment can help control symptoms. Treatment may include:  Taking medicines that block allergy symptoms, such as antihistamines.  Medicine may be given as a shot, nasal spray, or pill.  Avoiding the allergen.  Desensitization. This treatment involves getting ongoing shots until your body becomes less sensitive to the allergen. This treatment may be done if other treatments do not help.  If taking medicine and avoiding the allergen does not work, new, stronger medicines may be prescribed.  Follow these instructions at home:  Find out what you are allergic to. Common allergens include smoke, dust, and pollen.  Avoid the things you are allergic to. These are some things you can do to help avoid allergens: ? Replace carpet with wood, tile, or vinyl flooring. Carpet can trap dander and dust. ? Do not smoke. Do not allow smoking in your home. ? Change your heating and air conditioning filter at least once a month. ? During allergy season:  Keep windows closed as much as possible.  Plan outdoor activities when pollen counts are lowest. This is usually during the evening hours.  When coming indoors, change clothing and shower before sitting on furniture or bedding.  Take over-the-counter and prescription medicines only as told by your health care provider.  Keep all follow-up visits as told by your health care provider. This is important. Contact a health care provider if:  You have a fever.  You develop a persistent cough.  You make whistling sounds when you breathe (you wheeze).  Your symptoms interfere with your normal daily activities. Get help right away if:  You have shortness of breath. Summary  This condition can be managed by taking medicines as directed and  avoiding allergens.  Contact your health care provider if you develop a persistent cough or fever.  During allergy season, keep windows closed as much as possible. This information is not intended to replace advice given to you by your health care provider. Make sure you discuss any questions you have with your health care provider. Document  Released: 09/03/2001 Document Revised: 01/16/2017 Document Reviewed: 01/16/2017 Elsevier Interactive Patient Education  2018 [employer].      IF you received an x-ray today, you will receive an invoice from [facility]. Please contact [facility] at [phone] with questions or concerns regarding your invoice.   IF you received labwork today, you will receive an invoice from [facility]. Please contact [facility] at [phone] with questions or concerns regarding your invoice.   Our billing staff will not be able to assist you with questions regarding bills from these companies.  You will be contacted with the lab results as soon as they are available. The fastest way to get your results is to activate your My Chart account. Instructions are located on the last page of this paperwork. If you have not heard from [place] regarding the results in 2 weeks, please contact this office.

## 2018-02-20 LAB — LIPID PANEL
Chol/HDL Ratio: 1.8 ratio (ref 0.0–4.4)
Cholesterol, Total: 203 mg/dL — ABNORMAL HIGH (ref 100–199)
HDL: 110 mg/dL (ref >39)
LDL Calculated: 79 mg/dL (ref 0–99)
Triglycerides: 71 mg/dL (ref 0–149)
VLDL Cholesterol Cal: 14 mg/dL (ref 5–40)

## 2018-02-20 LAB — VITAMIN B12: Vitamin B-12: 785 pg/mL (ref 232–1245)

## 2018-02-20 LAB — VITAMIN D 25 HYDROXY (VIT D DEFICIENCY, FRACTURES): Vit D, 25-Hydroxy: 25.8 ng/mL — ABNORMAL LOW (ref 30.0–100.0)

## 2018-02-20 LAB — TSH: TSH: 5.98 u[IU]/mL — ABNORMAL HIGH (ref 0.450–4.500)

## 2018-02-20 LAB — COMPREHENSIVE METABOLIC PANEL
ALT: 20 IU/L (ref 0–32)
AST: 18 IU/L (ref 0–40)
Albumin/Globulin Ratio: 1.5 (ref 1.2–2.2)
Albumin: 4.7 g/dL (ref 3.6–4.8)
Alkaline Phosphatase: 85 IU/L (ref 39–117)
BUN/Creatinine Ratio: 11 — ABNORMAL LOW (ref 12–28)
BUN: 9 mg/dL (ref 8–27)
Bilirubin Total: 0.3 mg/dL (ref 0.0–1.2)
CO2: 26 mmol/L (ref 20–29)
Calcium: 10 mg/dL (ref 8.7–10.3)
Chloride: 102 mmol/L (ref 96–106)
Creatinine, Ser: 0.85 mg/dL (ref 0.57–1.00)
GFR calc Af Amer: 84 mL/min/{1.73_m2} (ref >59)
GFR calc non Af Amer: 73 mL/min/{1.73_m2} (ref >59)
Globulin, Total: 3.2 g/dL (ref 1.5–4.5)
Glucose: 100 mg/dL — ABNORMAL HIGH (ref 65–99)
Potassium: 4.5 mmol/L (ref 3.5–5.2)
Sodium: 142 mmol/L (ref 134–144)
Total Protein: 7.9 g/dL (ref 6.0–8.5)

## 2018-02-20 LAB — FERRITIN: Ferritin: 208 ng/mL — ABNORMAL HIGH (ref 15–150)

## 2018-02-20 LAB — MAGNESIUM: Magnesium: 2 mg/dL (ref 1.6–2.3)

## 2018-03-05 ENCOUNTER — Encounter: Payer: Self-pay | Admitting: *Deleted

## 2018-03-16 ENCOUNTER — Other Ambulatory Visit: Payer: Self-pay | Admitting: Family Medicine

## 2018-04-08 ENCOUNTER — Other Ambulatory Visit: Payer: Self-pay | Admitting: Family Medicine

## 2018-04-08 ENCOUNTER — Other Ambulatory Visit: Payer: Self-pay | Admitting: Obstetrics and Gynecology

## 2018-04-08 DIAGNOSIS — Z1231 Encounter for screening mammogram for malignant neoplasm of breast: Secondary | ICD-10-CM

## 2018-04-13 ENCOUNTER — Other Ambulatory Visit: Payer: Self-pay | Admitting: Family Medicine

## 2018-04-14 NOTE — Telephone Encounter (Signed)
Patient presents in person to request refill of rosuvastatin 5 mg. She states she doesn't know why refill has run out. Bottle brought with patient states prescription was written on 01/13/18. Last prescription on file for rosuvastatin has written date of 02/19/18, was sent to [facility] on [address]. Rosuvastatin 5 mg called in to [facility] on [address] per patient request. Patient appreciative.

## 2018-05-04 ENCOUNTER — Ambulatory Visit
Admission: RE | Admit: 2018-05-04 | Discharge: 2018-05-04 | Disposition: A | Discharge status: Home / Self Care | Payer: PPO | Source: Ambulatory Visit | Attending: Obstetrics and Gynecology | Admitting: Obstetrics and Gynecology

## 2018-05-04 DIAGNOSIS — Z1231 Encounter for screening mammogram for malignant neoplasm of breast: Secondary | ICD-10-CM | POA: Diagnosis not present

## 2018-05-14 DIAGNOSIS — Z96642 Presence of left artificial hip joint: Secondary | ICD-10-CM | POA: Diagnosis not present

## 2018-05-14 DIAGNOSIS — Z96641 Presence of right artificial hip joint: Secondary | ICD-10-CM | POA: Diagnosis not present

## 2018-05-14 DIAGNOSIS — M7061 Trochanteric bursitis, right hip: Secondary | ICD-10-CM | POA: Diagnosis not present

## 2018-05-14 DIAGNOSIS — M7062 Trochanteric bursitis, left hip: Secondary | ICD-10-CM | POA: Diagnosis not present

## 2018-05-29 ENCOUNTER — Other Ambulatory Visit: Payer: Self-pay | Admitting: Family Medicine

## 2018-06-23 ENCOUNTER — Other Ambulatory Visit: Payer: Self-pay | Admitting: Family Medicine

## 2018-06-23 DIAGNOSIS — M7062 Trochanteric bursitis, left hip: Secondary | ICD-10-CM | POA: Diagnosis not present

## 2018-06-23 DIAGNOSIS — M7061 Trochanteric bursitis, right hip: Secondary | ICD-10-CM | POA: Diagnosis not present

## 2018-06-23 NOTE — Telephone Encounter (Signed)
Fluticasone refill Last Refill:05/29/18 # 16g No RF Last OV: 02/19/18 PCP: Dr Carlota Raspberry Pharmacy:[facility].

## 2018-07-18 ENCOUNTER — Other Ambulatory Visit: Payer: Self-pay | Admitting: Family Medicine

## 2018-08-11 ENCOUNTER — Other Ambulatory Visit: Payer: Self-pay | Admitting: Family Medicine

## 2018-08-20 ENCOUNTER — Ambulatory Visit: Payer: PPO | Admitting: Family Medicine

## 2018-08-20 ENCOUNTER — Encounter: Payer: Self-pay | Admitting: Family Medicine

## 2018-08-20 ENCOUNTER — Other Ambulatory Visit: Payer: Self-pay

## 2018-08-20 VITALS — BP 148/80 | HR 73 | Temp 98.4°F | Ht 64.0 in | Wt 262.0 lb

## 2018-08-20 DIAGNOSIS — R7989 Other specified abnormal findings of blood chemistry: Secondary | ICD-10-CM

## 2018-08-20 DIAGNOSIS — E039 Hypothyroidism, unspecified: Secondary | ICD-10-CM

## 2018-08-20 DIAGNOSIS — E785 Hyperlipidemia, unspecified: Secondary | ICD-10-CM

## 2018-08-20 DIAGNOSIS — M792 Neuralgia and neuritis, unspecified: Secondary | ICD-10-CM

## 2018-08-20 DIAGNOSIS — R030 Elevated blood-pressure reading, without diagnosis of hypertension: Secondary | ICD-10-CM | POA: Diagnosis not present

## 2018-08-20 DIAGNOSIS — J309 Allergic rhinitis, unspecified: Secondary | ICD-10-CM

## 2018-08-20 MED ORDER — ROSUVASTATIN CALCIUM 5 MG PO TABS: ORAL_TABLET | ORAL | 1 refills | Status: DC

## 2018-08-20 MED ORDER — GABAPENTIN 600 MG PO TABS: 600.0000 mg | ORAL_TABLET | Freq: Three times a day (TID) | ORAL | 1 refills | Status: DC

## 2018-08-20 MED ORDER — SYNTHROID 100 MCG PO TABS: 100.0000 ug | ORAL_TABLET | Freq: Every day | ORAL | 1 refills | Status: DC

## 2018-08-20 NOTE — Progress Notes (Signed)
Subjective:    Patient ID: Veronica Martinez, female    DOB: 1956/12/08, 62 y.o.   MRN: [id number]  HPI Veronica Martinez is a 62 y.o. female Presents today for: Chief Complaint  Patient presents with  . chonic condition    6 month f/u on meds    Elevated blood pressure, noted today BP Readings from Last 3 Encounters:  08/20/18 (!) 148/80  02/19/18 132/72  10/21/17 140/82   Lab Results  Component Value Date   CREATININE 0.85 02/19/2018  Not currently on antihypertensives. reports normal home readings, goes up at dentist/doctors office. Husbands mother passed - affected diet and exercise regimen - has had some weight gain recently.  Wt Readings from Last 3 Encounters:  08/20/18 262 lb (118.8 kg)  02/19/18 243 lb (110.2 kg)  10/21/17 243 lb (110.2 kg)    Hypothyroidism: Lab Results  Component Value Date   TSH 5.980 (H) 02/19/2018  Slightly elevated as above in February, recommend repeat testing in 6 weeks.  She has continued Synthroid 100 mcg daily. Has not had rechecked. heat intolerance, but no hair/skin changes or palpitations.   Hyperlipidemia: Lab Results  Component Value Date   CHOL 203 (H) 02/19/2018   HDL 110 02/19/2018   LDLCALC 79 02/19/2018   TRIG 71 02/19/2018   CHOLHDL 1.8 02/19/2018   Lab Results  Component Value Date   ALT 20 02/19/2018   AST 18 02/19/2018   ALKPHOS 85 02/19/2018   BILITOT 0.3 02/19/2018   Takes Crestor 5 mg daily, no new myalgias/side effects.   Hx of left leg meralgia parasthetica, treated with gabapentin 2-3 times per day.  Allergies - treated with flonase, allegra. Stable.   GERD: Gastroenterologist Dr. [name], has taken Protonix 40 mg twice daily. Vit D borderline low in February - on 1 per day otc 1000 units. B12 and ferritin ok in February.   Patient Active Problem List   Diagnosis Date Noted  . 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 Right hip pain 08/09/2013   . Trigger finger, acquired 04/22/2013  . Carpal tunnel syndrome of right wrist 04/22/2013  . Pain of right heel 12/08/2012  . Myalgia 11/10/2012  . OBESITY, UNSPECIFIED 02/20/2011  . BACK PAIN, LUMBAR 07/05/2010  . ABNORMALITY OF GAIT 05/02/2010  . MERALGIA PARESTHETICA 03/02/2010  . HIP PAIN, BILATERAL 03/02/2010   Past Medical History:  Diagnosis Date  . Allergy   . GERD (gastroesophageal reflux disease)   . Headache(784.0)    sinus  . High cholesterol   . History of Graves' disease   . Hx of radioactive iodine thyroid ablation   . Hypothyroidism    Past Surgical History:  Procedure Laterality Date  . ABDOMINAL HYSTERECTOMY    . APPENDECTOMY    . CATARACT EXTRACTION W/PHACO Right 09/22/2013   Procedure: CATARACT EXTRACTION PHACO AND INTRAOCULAR LENS PLACEMENT (IOC);  Surgeon: [name], MD;  Location: [facility];  Service: Ophthalmology;  Laterality: Right;  . CATARACT EXTRACTION W/PHACO Left 12/29/2013   Procedure: CATARACT EXTRACTION PHACO AND INTRAOCULAR LENS PLACEMENT (IOC);  Surgeon: [name], MD;  Location: [facility];  Service: Ophthalmology;  Laterality: Left;  . COLONOSCOPY    . EYE SURGERY    . HIP ARTHROPLASTY Bilateral   . JOINT REPLACEMENT    . OTHER SURGICAL HISTORY Right    carpal tunel injection  . SHOULDER SURGERY    . TONSILLECTOMY     Allergies  Allergen  Reactions  . Meloxicam Other (See Comments)    Causes Bruising   . Pravastatin Sodium Other (See Comments)    Bruises  . Simvastatin Other (See Comments)    Bruises   . Tramadol Hcl Other (See Comments)    Bruising    Prior to Admission medications   Medication Sig Start Date End Date Taking? Authorizing Provider  Cholecalciferol (VITAMIN D) 2000 UNITS CAPS Take 2,000 Units by mouth daily.    Yes [provider]  Coenzyme Q10 (CO Q 10 PO) Take 75 mg by mouth 3 (three) times daily.   Yes [provider]  fluticasone (FLONASE) 50 MCG/ACT nasal spray USE 2 SPRAYS IN EACH NOSTRIL  EVERY DAY 08/11/18  Yes Wendie Agreste, MD  gabapentin (NEURONTIN) 600 MG tablet Take 1 tablet (600 mg total) by mouth 3 (three) times daily. 02/19/18  Yes Wendie Agreste, MD  Guaifenesin [facility] MAXIMUM STRENGTH) 1200 MG TB12 Take 1 tablet (1,200 mg total) by mouth every 12 (twelve) hours as needed. 05/10/16  Yes English, Colletta Maryland D, PA  ibuprofen (ADVIL,MOTRIN) 600 MG tablet Take 1 tablet (600 mg total) by mouth every 8 (eight) hours as needed (with food). 10/21/17  Yes Wendie Agreste, MD  Misc Natural Products (CALCIUM PLUS ADVANCED PO) Take 600 mg by mouth 2 (two) times daily.    Yes [provider]  Multiple Vitamins-Minerals (CENTRUM SILVER PO) Take by mouth.   Yes [provider]  Omega-3 Fatty Acids (FISH OIL) 1200 MG CAPS Take 1,200 mg by mouth 3 (three) times daily.    Yes [provider]  pantoprazole (PROTONIX) 40 MG tablet Take 1 tablet (40 mg total) by mouth daily. 01/16/17  Yes Wendie Agreste, MD  rosuvastatin (CRESTOR) 5 MG tablet 1TAKE 1 TABLET(5 MG) BY MOUTH DAILY AT 6 PM 02/19/18  Yes Wendie Agreste, MD  SYNTHROID 100 MCG tablet Take 1 tablet (100 mcg total) by mouth daily before breakfast. 02/19/18  Yes Wendie Agreste, MD  vitamin E 400 UNIT capsule Take 400 Units by mouth daily.   Yes [provider]   Social History   Socioeconomic History  . Marital status: Married    Spouse name: Not on file  . Number of children: Not on file  . Years of education: Not on file  . Highest education level: Not on file  Occupational History  . Not on file  Social Needs  . Financial resource strain: Not on file  . Food insecurity:    Worry: Not on file    Inability: Not on file  . Transportation needs:    Medical: Not on file    Non-medical: Not on file  Tobacco Use  . Smoking status: Never Smoker  . Smokeless tobacco: Never Used  Substance and Sexual Activity  . Alcohol use: No  . Drug use: No  . Sexual activity: Yes     Partners: Male  Lifestyle  . Physical activity:    Days per week: Not on file    Minutes per session: Not on file  . Stress: Not on file  Relationships  . Social connections:    Talks on phone: Not on file    Gets together: Not on file    Attends religious service: Not on file    Active member of club or organization: Not on file    Attends meetings of clubs or organizations: Not on file    Relationship status: Not on file  . Intimate  partner violence:    Fear of current or ex partner: Not on file    Emotionally abused: Not on file    Physically abused: Not on file    Forced sexual activity: Not on file  Other Topics Concern  . Not on file  Social History Narrative   Married. Education: [employer].     Review of Systems  Cardiovascular: Negative for palpitations.  Endocrine: Positive for heat intolerance.  Skin: Negative.   other per HPI.      Objective:   Physical Exam  Constitutional: She is oriented to person, place, and time. She appears well-developed and well-nourished.  HENT:  Head: Normocephalic and atraumatic.  Eyes: Pupils are equal, round, and reactive to light. Conjunctivae and EOM are normal.  Neck: Neck supple. Carotid bruit is not present. No thyromegaly present.  Cardiovascular: Normal rate, regular rhythm, normal heart sounds and intact distal pulses.  Pulmonary/Chest: Effort normal and breath sounds normal.  Abdominal: Soft. She exhibits no pulsatile midline mass. There is no tenderness.  Neurological: She is alert and oriented to person, place, and time.  Skin: Skin is warm and dry.  Psychiatric: She has a normal mood and affect. Her behavior is normal.  Vitals reviewed.  Vitals:   08/20/18 0834 08/20/18 0837  BP: (!) 151/90 (!) 148/80  Pulse: 73   Temp: 98.4 F (36.9 C)   TempSrc: Oral   SpO2: 98%   Weight: 262 lb (118.8 kg)   Height: 5\' 4"  (1.626 m)        Assessment & Plan:   Veronica Martinez is a 62 y.o. female Hypothyroidism,  unspecified type - Plan: TSH + free T4, SYNTHROID 100 MCG tablet  - borderline tsh prior - recheck with free t4, but continue same dose synthroid for now.   Elevated blood pressure reading without diagnosis of hypertension  - anticipate improvement with diet changes and restarting exercise. monior outside of office and rtc precautions.   Hyperlipidemia, unspecified hyperlipidemia type - Plan: Lipid panel, Comprehensive metabolic panel, rosuvastatin (CRESTOR) 5 MG tablet  -  Stable, tolerating current regimen. Medications refilled. Labs pending as above.   Low serum vitamin D - Plan: Vitamin D, 25-hydroxy  - repeat testing. continue supplement.   Allergic rhinitis, unspecified seasonality, unspecified trigger  Continue symptomatic care.  Elevated ferritin - Plan: Iron, TIBC and Ferritin Panel  - repeat levels.   Nerve pain - Plan: gabapentin (NEURONTIN) 600 MG tablet  - stable with gabapentin - continue same doses.   Meds ordered this encounter  Medications  . gabapentin (NEURONTIN) 600 MG tablet    Sig: Take 1 tablet (600 mg total) by mouth 3 (three) times daily.    Dispense:  270 tablet    Refill:  1    Please consider 90 day supplies to promote better adherence  . rosuvastatin (CRESTOR) 5 MG tablet    Sig: 1TAKE 1 TABLET(5 MG) BY MOUTH DAILY AT 6 PM    Dispense:  90 tablet    Refill:  1  . SYNTHROID 100 MCG tablet    Sig: Take 1 tablet (100 mcg total) by mouth daily before breakfast.    Dispense:  90 tablet    Refill:  1   Patient Instructions   Blood pressure running a little higher. continue to exercise, watch diet and restaurant foods/portions to keep weight controlled. Keep a record of your blood pressures outside of the office and if 140/90 - return to discuss treatment.   I  will repeat some blood tests including low vitamin D, ferritin as that was borderline elevated but not low when checked in February.  No change in medications for now, please follow-up in 6 months,  let me know if there are questions in the meantime.  Thank you for coming in today.  If you have lab work done today you will be contacted with your lab results within the next 2 weeks.  If you have not heard from [place] then please contact us. The fastest way to get your results is to register for My Chart.   IF you received an x-ray today, you will receive an invoice from [facility]. Please contact [facility] at [phone] with questions or concerns regarding your invoice.   IF you received labwork today, you will receive an invoice from [facility]. Please contact [facility] at [phone] with questions or concerns regarding your invoice.   Our billing staff will not be able to assist you with questions regarding bills from these companies.  You will be contacted with the lab results as soon as they are available. The fastest way to get your results is to activate your My Chart account. Instructions are located on the last page of this paperwork. If you have not heard from [place] regarding the results in 2 weeks, please contact this office.       Signed,   [name], MD [facility].  08/22/18 11:55 PM

## 2018-08-20 NOTE — Patient Instructions (Addendum)
Blood pressure running a little higher. continue to exercise, watch diet and restaurant foods/portions to keep weight controlled. Keep a record of your blood pressures outside of the office and if 140/90 - return to discuss treatment.   I will repeat some blood tests including low vitamin D, ferritin as that was borderline elevated but not low when checked in February.  No change in medications for now, please follow-up in 6 months, let me know if there are questions in the meantime.  Thank you for coming in today.  If you have lab work done today you will be contacted with your lab results within the next 2 weeks.  If you have not heard from [place] then please contact us. The fastest way to get your results is to register for My Chart.   IF you received an x-ray today, you will receive an invoice from [facility]. Please contact [facility] at [phone] with questions or concerns regarding your invoice.   IF you received labwork today, you will receive an invoice from [facility]. Please contact [facility] at [phone] with questions or concerns regarding your invoice.   Our billing staff will not be able to assist you with questions regarding bills from these companies.  You will be contacted with the lab results as soon as they are available. The fastest way to get your results is to activate your My Chart account. Instructions are located on the last page of this paperwork. If you have not heard from [place] regarding the results in 2 weeks, please contact this office.

## 2018-08-21 LAB — VITAMIN D 25 HYDROXY (VIT D DEFICIENCY, FRACTURES): Vit D, 25-Hydroxy: 35.3 ng/mL (ref 30.0–100.0)

## 2018-08-21 LAB — LIPID PANEL
Chol/HDL Ratio: 1.7 ratio (ref 0.0–4.4)
Cholesterol, Total: 177 mg/dL (ref 100–199)
HDL: 105 mg/dL (ref >39)
LDL Calculated: 59 mg/dL (ref 0–99)
Triglycerides: 63 mg/dL (ref 0–149)
VLDL Cholesterol Cal: 13 mg/dL (ref 5–40)

## 2018-08-21 LAB — COMPREHENSIVE METABOLIC PANEL
ALT: 21 IU/L (ref 0–32)
AST: 23 IU/L (ref 0–40)
Albumin/Globulin Ratio: 1.8 (ref 1.2–2.2)
Albumin: 4.7 g/dL (ref 3.6–4.8)
Alkaline Phosphatase: 70 IU/L (ref 39–117)
BUN/Creatinine Ratio: 13 (ref 12–28)
BUN: 9 mg/dL (ref 8–27)
Bilirubin Total: 0.3 mg/dL (ref 0.0–1.2)
CO2: 24 mmol/L (ref 20–29)
Calcium: 9.8 mg/dL (ref 8.7–10.3)
Chloride: 106 mmol/L (ref 96–106)
Creatinine, Ser: 0.69 mg/dL (ref 0.57–1.00)
GFR calc Af Amer: 106 mL/min/{1.73_m2} (ref >59)
GFR calc non Af Amer: 92 mL/min/{1.73_m2} (ref >59)
Globulin, Total: 2.6 g/dL (ref 1.5–4.5)
Glucose: 99 mg/dL (ref 65–99)
Potassium: 4.7 mmol/L (ref 3.5–5.2)
Sodium: 146 mmol/L — ABNORMAL HIGH (ref 134–144)
Total Protein: 7.3 g/dL (ref 6.0–8.5)

## 2018-08-21 LAB — IRON,TIBC AND FERRITIN PANEL
Ferritin: 150 ng/mL (ref 15–150)
Iron Saturation: 28 % (ref 15–55)
Iron: 80 ug/dL (ref 27–139)
Total Iron Binding Capacity: 287 ug/dL (ref 250–450)
UIBC: 207 ug/dL (ref 118–369)

## 2018-08-21 LAB — TSH+FREE T4
Free T4: 1.32 ng/dL (ref 0.82–1.77)
TSH: 5.06 u[IU]/mL — ABNORMAL HIGH (ref 0.450–4.500)

## 2018-08-23 ENCOUNTER — Encounter: Payer: Self-pay | Admitting: Family Medicine

## 2018-09-08 DIAGNOSIS — H10413 Chronic giant papillary conjunctivitis, bilateral: Secondary | ICD-10-CM | POA: Diagnosis not present

## 2018-09-08 DIAGNOSIS — H35372 Puckering of macula, left eye: Secondary | ICD-10-CM | POA: Diagnosis not present

## 2018-09-08 DIAGNOSIS — H01025 Squamous blepharitis left lower eyelid: Secondary | ICD-10-CM | POA: Diagnosis not present

## 2018-09-08 DIAGNOSIS — H26492 Other secondary cataract, left eye: Secondary | ICD-10-CM | POA: Diagnosis not present

## 2018-09-08 DIAGNOSIS — Z961 Presence of intraocular lens: Secondary | ICD-10-CM | POA: Diagnosis not present

## 2018-09-08 DIAGNOSIS — H43812 Vitreous degeneration, left eye: Secondary | ICD-10-CM | POA: Diagnosis not present

## 2018-09-08 DIAGNOSIS — H0264 Xanthelasma of left upper eyelid: Secondary | ICD-10-CM | POA: Diagnosis not present

## 2018-09-08 DIAGNOSIS — H01022 Squamous blepharitis right lower eyelid: Secondary | ICD-10-CM | POA: Diagnosis not present

## 2018-09-08 DIAGNOSIS — H01021 Squamous blepharitis right upper eyelid: Secondary | ICD-10-CM | POA: Diagnosis not present

## 2018-09-08 DIAGNOSIS — H26491 Other secondary cataract, right eye: Secondary | ICD-10-CM | POA: Diagnosis not present

## 2018-09-08 DIAGNOSIS — H01024 Squamous blepharitis left upper eyelid: Secondary | ICD-10-CM | POA: Diagnosis not present

## 2018-09-22 ENCOUNTER — Other Ambulatory Visit: Payer: Self-pay | Admitting: Family Medicine

## 2018-09-22 NOTE — Telephone Encounter (Signed)
Requested Prescriptions  Pending Prescriptions Disp Refills  . fluticasone (FLONASE) 50 MCG/ACT nasal spray [Pharmacy Med Name: FLUTICASONE PROP 50 MCG SPRAY]  0    Sig: SPRAY 2 SPRAYS INTO EACH NOSTRIL EVERY DAY     Ear, Nose, and Throat: Nasal Preparations - Corticosteroids Passed - 09/22/2018  2:10 AM      Passed - Valid encounter within last 12 months    Recent Outpatient Visits          1 month ago Hypothyroidism, unspecified type   [facility] [place], Ranell Patrick, MD   7 months ago Gastroesophageal reflux disease, esophagitis presence not specified   [facility] [place] [facility], Ranell Patrick, MD   11 months ago Right sided sciatica   [facility] [place], Ranell Patrick, MD   1 year ago Meralgia paresthetica of right side   [facility] [place] [facility], Ranell Patrick, MD   1 year ago Burning chest pain   [facility] [place] [facility], Ranell Patrick, MD      Future Appointments            In 4 months Carlota Raspberry Ranell Patrick, MD [facility] [place], [facility]         Medication approved

## 2018-09-24 DIAGNOSIS — M7661 Achilles tendinitis, right leg: Secondary | ICD-10-CM | POA: Diagnosis not present

## 2018-10-08 ENCOUNTER — Other Ambulatory Visit: Payer: Self-pay | Admitting: Family Medicine

## 2018-10-08 DIAGNOSIS — E785 Hyperlipidemia, unspecified: Secondary | ICD-10-CM

## 2018-10-09 ENCOUNTER — Other Ambulatory Visit: Payer: Self-pay | Admitting: Family Medicine

## 2018-10-09 DIAGNOSIS — E785 Hyperlipidemia, unspecified: Secondary | ICD-10-CM

## 2018-10-12 NOTE — Telephone Encounter (Signed)
Requested Prescriptions  Pending Prescriptions Disp Refills  . rosuvastatin (CRESTOR) 5 MG tablet [Pharmacy Med Name: ROSUVASTATIN 5MG  TABLETS] 90 tablet 0    Sig: TAKE 1 TABLET DAILY AT 6PM     Cardiovascular:  Antilipid - Statins Passed - 10/09/2018  8:44 PM      Passed - Total Cholesterol in normal range and within 360 days    Cholesterol, Total  Date Value Ref Range Status  08/20/2018 177 100 - 199 mg/dL Final         Passed - LDL in normal range and within 360 days    LDL Calculated  Date Value Ref Range Status  08/20/2018 59 0 - 99 mg/dL Final         Passed - HDL in normal range and within 360 days    HDL  Date Value Ref Range Status  08/20/2018 105 >39 mg/dL Final         Passed - Triglycerides in normal range and within 360 days    Triglycerides  Date Value Ref Range Status  08/20/2018 63 0 - 149 mg/dL Final         Passed - Patient is not pregnant      Passed - Valid encounter within last 12 months    Recent Outpatient Visits          1 month ago Hypothyroidism, unspecified type   [facility], Ranell Patrick, MD   7 months ago Gastroesophageal reflux disease, esophagitis presence not specified   [facility] [place], Ranell Patrick, MD   11 months ago Right sided sciatica   [facility] [place], Ranell Patrick, MD   1 year ago Meralgia paresthetica of right side   [facility] [place] [facility], Ranell Patrick, MD   1 year ago Burning chest pain   [facility] [place], Ranell Patrick, MD      Future Appointments            In 4 months Carlota Raspberry Ranell Patrick, MD [facility] at [place], [facility]

## 2018-10-15 ENCOUNTER — Other Ambulatory Visit: Payer: Self-pay | Admitting: Family Medicine

## 2019-01-05 ENCOUNTER — Other Ambulatory Visit: Payer: Self-pay | Admitting: Family Medicine

## 2019-01-05 DIAGNOSIS — E785 Hyperlipidemia, unspecified: Secondary | ICD-10-CM

## 2019-01-05 NOTE — Telephone Encounter (Signed)
Requested Prescriptions  Pending Prescriptions Disp Refills  . rosuvastatin (CRESTOR) 5 MG tablet [Pharmacy Med Name: ROSUVASTATIN 5MG  TABLETS] 90 tablet 0    Sig: TAKE 1 TABLET DAILY AT 6PM     Cardiovascular:  Antilipid - Statins Passed - 01/05/2019 10:08 AM      Passed - Total Cholesterol in normal range and within 360 days    Cholesterol, Total  Date Value Ref Range Status  08/20/2018 177 100 - 199 mg/dL Final         Passed - LDL in normal range and within 360 days    LDL Calculated  Date Value Ref Range Status  08/20/2018 59 0 - 99 mg/dL Final         Passed - HDL in normal range and within 360 days    HDL  Date Value Ref Range Status  08/20/2018 105 >39 mg/dL Final         Passed - Triglycerides in normal range and within 360 days    Triglycerides  Date Value Ref Range Status  08/20/2018 63 0 - 149 mg/dL Final         Passed - Patient is not pregnant      Passed - Valid encounter within last 12 months    Recent Outpatient Visits          4 months ago Hypothyroidism, unspecified type   [facility] [place], Ranell Patrick, MD   10 months ago Gastroesophageal reflux disease, esophagitis presence not specified   [facility] [place], Ranell Patrick, MD   1 year ago Right sided sciatica   [facility] [place], Ranell Patrick, MD   1 year ago Meralgia paresthetica of right side   [facility] [place], Ranell Patrick, MD   1 year ago Burning chest pain   [facility] [place], Ranell Patrick, MD      Future Appointments            In 1 month Carlota Raspberry Ranell Patrick, MD [facility] at [place], [facility]

## 2019-02-15 ENCOUNTER — Ambulatory Visit (INDEPENDENT_AMBULATORY_CARE_PROVIDER_SITE_OTHER): Payer: PPO | Admitting: Family Medicine

## 2019-02-15 ENCOUNTER — Other Ambulatory Visit: Payer: Self-pay

## 2019-02-15 ENCOUNTER — Encounter: Payer: Self-pay | Admitting: Family Medicine

## 2019-02-15 VITALS — BP 130/76 | HR 77 | Temp 98.4°F | Resp 14 | Ht 64.0 in | Wt 247.6 lb

## 2019-02-15 DIAGNOSIS — R7989 Other specified abnormal findings of blood chemistry: Secondary | ICD-10-CM | POA: Diagnosis not present

## 2019-02-15 DIAGNOSIS — Z23 Encounter for immunization: Secondary | ICD-10-CM

## 2019-02-15 DIAGNOSIS — E039 Hypothyroidism, unspecified: Secondary | ICD-10-CM | POA: Diagnosis not present

## 2019-02-15 DIAGNOSIS — E785 Hyperlipidemia, unspecified: Secondary | ICD-10-CM

## 2019-02-15 NOTE — Progress Notes (Signed)
Subjective:    Patient ID: Veronica Martinez, female    DOB: 1956-07-22, 63 y.o.   MRN: [id number]  HPI Veronica Martinez is a 63 y.o. female Presents today for: Chief Complaint  Patient presents with  . Hypothyroidism    6 month reck on hypothyroidism. Last seen here 08/20/18 reck labs    Hypothyroidism: Lab Results  Component Value Date   TSH 5.060 (H) 08/20/2018  Borderline elevated TSH as above.  Had improved from previous reading.  Decided to remain on same dose of Synthroid -100 mcg daily. Some weight gain then improving now. Diet changes - more vegetables, water and walking few times per week.  No hair/skin/temp changes.  No palpitations.  Wt Readings from Last 3 Encounters:  02/15/19 247 lb 9.6 oz (112.3 kg)  08/20/18 262 lb (118.8 kg)  02/19/18 243 lb (110.2 kg)   May have some whitecoat hypertension component as elevated BP readings in the office only  Hyperlipidemia:  Lab Results  Component Value Date   CHOL 177 08/20/2018   HDL 105 08/20/2018   LDLCALC 59 08/20/2018   TRIG 63 08/20/2018   CHOLHDL 1.7 08/20/2018   Lab Results  Component Value Date   ALT 21 08/20/2018   AST 23 08/20/2018   ALKPHOS 70 08/20/2018   BILITOT 0.3 08/20/2018  Crestor 5 mg daily. No new side effects.   No new side effects.   Elevated ferritin last year, ok 5 months ago. Repeat testing today. Vit D improved last visit.    Patient Active Problem List   Diagnosis Date Noted  . 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 Right hip pain 08/09/2013  . Trigger finger, acquired 04/22/2013  . Carpal tunnel syndrome of right wrist 04/22/2013  . Pain of right heel 12/08/2012  . Myalgia 11/10/2012  . OBESITY, UNSPECIFIED 02/20/2011  . BACK PAIN, LUMBAR 07/05/2010  . ABNORMALITY OF GAIT 05/02/2010  . MERALGIA PARESTHETICA 03/02/2010  . HIP PAIN, BILATERAL 03/02/2010   Past Medical History:  Diagnosis Date  . Allergy   .  GERD (gastroesophageal reflux disease)   . Headache(784.0)    sinus  . High cholesterol   . History of Graves' disease   . Hx of radioactive iodine thyroid ablation   . Hypothyroidism    Past Surgical History:  Procedure Laterality Date  . ABDOMINAL HYSTERECTOMY    . APPENDECTOMY    . CATARACT EXTRACTION W/PHACO Right 09/22/2013   Procedure: CATARACT EXTRACTION PHACO AND INTRAOCULAR LENS PLACEMENT (IOC);  Surgeon: [name], MD;  Location: [facility];  Service: Ophthalmology;  Laterality: Right;  . CATARACT EXTRACTION W/PHACO Left 12/29/2013   Procedure: CATARACT EXTRACTION PHACO AND INTRAOCULAR LENS PLACEMENT (IOC);  Surgeon: [name], MD;  Location: [facility];  Service: Ophthalmology;  Laterality: Left;  . COLONOSCOPY    . EYE SURGERY    . HIP ARTHROPLASTY Bilateral   . JOINT REPLACEMENT    . OTHER SURGICAL HISTORY Right    carpal tunel injection  . SHOULDER SURGERY    . TONSILLECTOMY     Allergies  Allergen Reactions  . Meloxicam Other (See Comments)    Causes Bruising   . Pravastatin Sodium Other (See Comments)    Bruises  . Simvastatin Other (See Comments)    Bruises   . Tramadol Hcl Other (See Comments)    Bruising    Prior to Admission medications   Medication Sig Start Date End Date Taking?  Authorizing Provider  acetaminophen (TYLENOL) 650 MG CR tablet Take 650 mg by mouth every 8 (eight) hours as needed for pain.   Yes [provider]  Cholecalciferol (VITAMIN D) 50 MCG (2000 UT) tablet Take 2,000 Units by mouth daily.   Yes [provider]  Coenzyme Q10 (CO Q 10 PO) Take 75 mg by mouth 3 (three) times daily.   Yes [provider]  fluticasone (FLONASE) 50 MCG/ACT nasal spray SPRAY 2 SPRAYS INTO EACH NOSTRIL EVERY DAY 10/15/18  Yes Wendie Agreste, MD  gabapentin (NEURONTIN) 600 MG tablet Take 1 tablet (600 mg total) by mouth 3 (three) times daily. 08/20/18  Yes Wendie Agreste, MD  Guaifenesin [facility] MAXIMUM STRENGTH) 1200 MG TB12  Take 1 tablet (1,200 mg total) by mouth every 12 (twelve) hours as needed. 05/10/16  Yes English, Colletta Maryland D, PA  ibuprofen (ADVIL,MOTRIN) 600 MG tablet Take 1 tablet (600 mg total) by mouth every 8 (eight) hours as needed (with food). 10/21/17  Yes Wendie Agreste, MD  Misc Natural Products (CALCIUM PLUS ADVANCED PO) Take 600 mg by mouth 2 (two) times daily.    Yes [provider]  Multiple Vitamins-Minerals (CENTRUM SILVER PO) Take by mouth.   Yes [provider]  Omega-3 Fatty Acids (FISH OIL) 1200 MG CAPS Take 1,200 mg by mouth 3 (three) times daily.    Yes [provider]  pantoprazole (PROTONIX) 40 MG tablet Take 1 tablet (40 mg total) by mouth daily. 01/16/17  Yes Wendie Agreste, MD  rosuvastatin (CRESTOR) 5 MG tablet TAKE 1 TABLET DAILY AT 6PM 01/05/19  Yes Wendie Agreste, MD  SYNTHROID 100 MCG tablet Take 1 tablet (100 mcg total) by mouth daily before breakfast. 08/20/18  Yes Wendie Agreste, MD  vitamin E 400 UNIT capsule Take 400 Units by mouth daily.   Yes [provider]   Social History   Socioeconomic History  . Marital status: Married    Spouse name: Not on file  . Number of children: Not on file  . Years of education: Not on file  . Highest education level: Not on file  Occupational History  . Not on file  Social Needs  . Financial resource strain: Not on file  . Food insecurity:    Worry: Not on file    Inability: Not on file  . Transportation needs:    Medical: Not on file    Non-medical: Not on file  Tobacco Use  . Smoking status: Never Smoker  . Smokeless tobacco: Never Used  Substance and Sexual Activity  . Alcohol use: No  . Drug use: No  . Sexual activity: Yes    Partners: Male  Lifestyle  . Physical activity:    Days per week: Not on file    Minutes per session: Not on file  . Stress: Not on file  Relationships  . Social connections:    Talks on phone: Not on file    Gets together: Not on file    Attends  religious service: Not on file    Active member of club or organization: Not on file    Attends meetings of clubs or organizations: Not on file    Relationship status: Not on file  . Intimate partner violence:    Fear of current or ex partner: Not on file    Emotionally abused: Not on file    Physically abused: Not on file    Forced sexual activity: Not on file  Other Topics Concern  . Not on file  Social History Narrative   Married. Education: [employer].     Review of Systems Per HPI.     Objective:   Physical Exam Vitals signs reviewed.  Constitutional:      Appearance: She is well-developed.  HENT:     Head: Normocephalic and atraumatic.  Eyes:     Conjunctiva/sclera: Conjunctivae normal.     Pupils: Pupils are equal, round, and reactive to light.  Neck:     Thyroid: No thyroid mass, thyromegaly or thyroid tenderness.     Vascular: No carotid bruit.  Cardiovascular:     Rate and Rhythm: Normal rate and regular rhythm.     Heart sounds: Normal heart sounds.  Pulmonary:     Effort: Pulmonary effort is normal.     Breath sounds: Normal breath sounds.  Abdominal:     Palpations: Abdomen is soft. There is no pulsatile mass.     Tenderness: There is no abdominal tenderness.  Skin:    General: Skin is warm and dry.  Neurological:     Mental Status: She is alert and oriented to person, place, and time.  Psychiatric:        Behavior: Behavior normal.    Vitals:   02/15/19 1038 02/15/19 1050  BP: (!) 161/79 130/76  Pulse: 77   Resp: 14   Temp: 98.4 F (36.9 C)   TempSrc: Oral   SpO2: 98%   Weight: 247 lb 9.6 oz (112.3 kg)   Height: 5\' 4"  (1.626 m)        Assessment & Plan:  RANIAH KARAN is a 63 y.o. female Hypothyroidism, unspecified type - Plan: TSH  -  Stable, tolerating current regimen. Labs pending as above.  No changes for now  Hyperlipidemia, unspecified hyperlipidemia type - Plan: Comprehensive metabolic panel  -Tolerating daily 10 weeks  rosuvastatin.  Continue same.  Elevated ferritin - Plan: Ferritin Low serum vitamin D - Plan: Vitamin D, 25-hydroxy  -repeat testing for stability.  Need for prophylactic vaccination and inoculation against influenza - Plan: Flu Vaccine QUAD 36+ mos IM   No orders of the defined types were placed in this encounter.  Patient Instructions     No change in medications for now.  Follow-up in 6 months.  Thank you for coming in today.   If you have lab work done today you will be contacted with your lab results within the next 2 weeks.  If you have not heard from [place] then please contact us. The fastest way to get your results is to register for My Chart.   IF you received an x-Martinez today, you will receive an invoice from [facility]. Please contact [facility] at [phone] with questions or concerns regarding your invoice.   IF you received labwork today, you will receive an invoice from [facility]. Please contact [facility] at [phone] with questions or concerns regarding your invoice.   Our billing staff will not be able to assist you with questions regarding bills from these companies.  You will be contacted with the lab results as soon as they are available. The fastest way to get your results is to activate your My Chart account. Instructions are located on the last page of this paperwork. If you have not heard from [place] regarding the results in 2 weeks, please contact this office.      Signed,   Veronica Ray, MD [facility].  02/16/19  10:00 PM

## 2019-02-15 NOTE — Patient Instructions (Addendum)
   No change in medications for now.  Follow-up in 6 months.  Thank you for coming in today.   If you have lab work done today you will be contacted with your lab results within the next 2 weeks.  If you have not heard from [place] then please contact us. The fastest way to get your results is to register for My Chart.   IF you received an x-ray today, you will receive an invoice from [facility]. Please contact [facility] at [phone] with questions or concerns regarding your invoice.   IF you received labwork today, you will receive an invoice from [facility]. Please contact [facility] at [phone] with questions or concerns regarding your invoice.   Our billing staff will not be able to assist you with questions regarding bills from these companies.  You will be contacted with the lab results as soon as they are available. The fastest way to get your results is to activate your My Chart account. Instructions are located on the last page of this paperwork. If you have not heard from [place] regarding the results in 2 weeks, please contact this office.

## 2019-02-16 ENCOUNTER — Encounter: Payer: Self-pay | Admitting: Family Medicine

## 2019-02-16 LAB — COMPREHENSIVE METABOLIC PANEL
ALT: 19 IU/L (ref 0–32)
AST: 19 IU/L (ref 0–40)
Albumin/Globulin Ratio: 1.7 (ref 1.2–2.2)
Albumin: 4.7 g/dL (ref 3.8–4.8)
Alkaline Phosphatase: 76 IU/L (ref 39–117)
BUN/Creatinine Ratio: 11 — ABNORMAL LOW (ref 12–28)
BUN: 9 mg/dL (ref 8–27)
Bilirubin Total: 0.3 mg/dL (ref 0.0–1.2)
CO2: 19 mmol/L — ABNORMAL LOW (ref 20–29)
Calcium: 10.1 mg/dL (ref 8.7–10.3)
Chloride: 108 mmol/L — ABNORMAL HIGH (ref 96–106)
Creatinine, Ser: 0.79 mg/dL (ref 0.57–1.00)
GFR calc Af Amer: 91 mL/min/{1.73_m2} (ref >59)
GFR calc non Af Amer: 79 mL/min/{1.73_m2} (ref >59)
Globulin, Total: 2.7 g/dL (ref 1.5–4.5)
Glucose: 94 mg/dL (ref 65–99)
Potassium: 5.1 mmol/L (ref 3.5–5.2)
Sodium: 148 mmol/L — ABNORMAL HIGH (ref 134–144)
Total Protein: 7.4 g/dL (ref 6.0–8.5)

## 2019-02-16 LAB — VITAMIN D 25 HYDROXY (VIT D DEFICIENCY, FRACTURES): Vit D, 25-Hydroxy: 36.2 ng/mL (ref 30.0–100.0)

## 2019-02-16 LAB — TSH: TSH: 2.64 u[IU]/mL (ref 0.450–4.500)

## 2019-02-16 LAB — FERRITIN: Ferritin: 184 ng/mL — ABNORMAL HIGH (ref 15–150)

## 2019-03-01 ENCOUNTER — Encounter: Payer: Self-pay | Admitting: Radiology

## 2019-03-05 ENCOUNTER — Other Ambulatory Visit: Payer: Self-pay

## 2019-03-05 ENCOUNTER — Ambulatory Visit (INDEPENDENT_AMBULATORY_CARE_PROVIDER_SITE_OTHER): Payer: PPO | Admitting: Family Medicine

## 2019-03-05 ENCOUNTER — Encounter: Payer: Self-pay | Admitting: Family Medicine

## 2019-03-05 VITALS — BP 138/78 | HR 81 | Temp 98.2°F | Ht 65.0 in | Wt 247.4 lb

## 2019-03-05 DIAGNOSIS — R109 Unspecified abdominal pain: Secondary | ICD-10-CM | POA: Diagnosis not present

## 2019-03-05 LAB — POCT URINALYSIS DIP (MANUAL ENTRY)
Bilirubin, UA: NEGATIVE
Blood, UA: NEGATIVE
Glucose, UA: NEGATIVE mg/dL
Ketones, POC UA: NEGATIVE mg/dL
Leukocytes, UA: NEGATIVE
Nitrite, UA: NEGATIVE
Protein Ur, POC: NEGATIVE mg/dL
Spec Grav, UA: 1.015 (ref 1.010–1.025)
Urobilinogen, UA: 0.2 E.U./dL
pH, UA: 7.5 (ref 5.0–8.0)

## 2019-03-05 NOTE — Progress Notes (Signed)
Subjective:    Patient ID: Veronica Martinez, female    DOB: [birth year], 63 y.o.   MRN: [id number]  HPI Veronica Martinez is a 63 y.o. female Presents today for: Chief Complaint  Patient presents with  . left side pain    started wed night     R sided low back/flank pain. Started 2 days ago.  No dysuria, urgency, hematuria, or frequency.  No prior kidney stones.  No fever, no n/v. NKI, no fall.  Min sore with twisting. Better today, not as intense. No new activities. No cough/dyspnea.   Tx; none   Patient Active Problem List   Diagnosis Date Noted  . 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 Right hip pain 08/09/2013  . Trigger finger, acquired 04/22/2013  . Carpal tunnel syndrome of right wrist 04/22/2013  . Pain of right heel 12/08/2012  . Myalgia 11/10/2012  . OBESITY, UNSPECIFIED 02/20/2011  . BACK PAIN, LUMBAR 07/05/2010  . ABNORMALITY OF GAIT 05/02/2010  . MERALGIA PARESTHETICA 03/02/2010  . HIP PAIN, BILATERAL 03/02/2010   Past Medical History:  Diagnosis Date  . Allergy   . GERD (gastroesophageal reflux disease)   . Headache(784.0)    sinus  . High cholesterol   . History of Graves' disease   . Hx of radioactive iodine thyroid ablation   . Hypothyroidism    Past Surgical History:  Procedure Laterality Date  . ABDOMINAL HYSTERECTOMY    . APPENDECTOMY    . CATARACT EXTRACTION W/PHACO Right 09/22/2013   Procedure: CATARACT EXTRACTION PHACO AND INTRAOCULAR LENS PLACEMENT (IOC);  Surgeon: [name], MD;  Location: [facility];  Service: Ophthalmology;  Laterality: Right;  . CATARACT EXTRACTION W/PHACO Left 12/29/2013   Procedure: CATARACT EXTRACTION PHACO AND INTRAOCULAR LENS PLACEMENT (IOC);  Surgeon: [name], MD;  Location: [facility];  Service: Ophthalmology;  Laterality: Left;  . COLONOSCOPY    . EYE SURGERY    . HIP ARTHROPLASTY Bilateral   . JOINT REPLACEMENT    . OTHER SURGICAL HISTORY Right    carpal tunel injection  . SHOULDER SURGERY    . TONSILLECTOMY     Allergies  Allergen Reactions  . Meloxicam Other (See Comments)    Causes Bruising   . Pravastatin Sodium Other (See Comments)    Bruises  . Simvastatin Other (See Comments)    Bruises   . Tramadol Hcl Other (See Comments)    Bruising    Prior to Admission medications   Medication Sig Start Date End Date Taking? Authorizing Provider  acetaminophen (TYLENOL) 650 MG CR tablet Take 650 mg by mouth every 8 (eight) hours as needed for pain.   Yes [provider]  Cholecalciferol (VITAMIN D) 50 MCG (2000 UT) tablet Take 2,000 Units by mouth daily.   Yes [provider]  Coenzyme Q10 (CO Q 10 PO) Take 75 mg by mouth 3 (three) times daily.   Yes [provider]  fluticasone (FLONASE) 50 MCG/ACT nasal spray SPRAY 2 SPRAYS INTO EACH NOSTRIL EVERY DAY 10/15/18  Yes Wendie Agreste, MD  gabapentin (NEURONTIN) 600 MG tablet Take 1 tablet (600 mg total) by mouth 3 (three) times daily. 08/20/18  Yes Wendie Agreste, MD  Guaifenesin [facility] MAXIMUM STRENGTH) 1200 MG TB12 Take 1 tablet (1,200 mg total) by mouth every 12 (twelve) hours as needed. 05/10/16  Yes English, Colletta Maryland D, PA  ibuprofen (ADVIL,MOTRIN) 600 MG tablet Take 1 tablet (600 mg total)  by mouth every 8 (eight) hours as needed (with food). 10/21/17  Yes Wendie Agreste, MD  Misc Natural Products (CALCIUM PLUS ADVANCED PO) Take 600 mg by mouth 2 (two) times daily.    Yes [provider]  Multiple Vitamins-Minerals (CENTRUM SILVER PO) Take by mouth.   Yes [provider]  Omega-3 Fatty Acids (FISH OIL) 1200 MG CAPS Take 1,200 mg by mouth 3 (three) times daily.    Yes [provider]  pantoprazole (PROTONIX) 40 MG tablet Take 1 tablet (40 mg total) by mouth daily. 01/16/17  Yes Wendie Agreste, MD  rosuvastatin (CRESTOR) 5 MG tablet TAKE 1 TABLET DAILY AT 6PM 01/05/19  Yes Wendie Agreste, MD  SYNTHROID 100 MCG  tablet Take 1 tablet (100 mcg total) by mouth daily before breakfast. 08/20/18  Yes Wendie Agreste, MD  vitamin E 400 UNIT capsule Take 400 Units by mouth daily.   Yes [provider]   Social History   Socioeconomic History  . Marital status: Married    Spouse name: Not on file  . Number of children: Not on file  . Years of education: Not on file  . Highest education level: Not on file  Occupational History  . Not on file  Social Needs  . Financial resource strain: Not on file  . Food insecurity:    Worry: Not on file    Inability: Not on file  . Transportation needs:    Medical: Not on file    Non-medical: Not on file  Tobacco Use  . Smoking status: Never Smoker  . Smokeless tobacco: Never Used  Substance and Sexual Activity  . Alcohol use: No  . Drug use: No  . Sexual activity: Yes    Partners: Male  Lifestyle  . Physical activity:    Days per week: Not on file    Minutes per session: Not on file  . Stress: Not on file  Relationships  . Social connections:    Talks on phone: Not on file    Gets together: Not on file    Attends religious service: Not on file    Active member of club or organization: Not on file    Attends meetings of clubs or organizations: Not on file    Relationship status: Not on file  . Intimate partner violence:    Fear of current or ex partner: Not on file    Emotionally abused: Not on file    Physically abused: Not on file    Forced sexual activity: Not on file  Other Topics Concern  . Not on file  Social History Narrative   Married. Education: [employer].     Review of Systems As above in HPI.     Objective:   Physical Exam Vitals signs reviewed.  Constitutional:      General: She is not in acute distress.    Appearance: She is well-developed.  HENT:     Head: Normocephalic and atraumatic.  Cardiovascular:     Rate and Rhythm: Normal rate.  Pulmonary:     Effort: Pulmonary effort is normal.  Abdominal:      General: There is no distension.     Tenderness: There is no abdominal tenderness. There is no right CVA tenderness, left CVA tenderness or guarding.  Musculoskeletal:     Lumbar back: She exhibits normal range of motion (slight discomfort with R and L rotation. ), no tenderness, no bony tenderness, no swelling and no spasm.  Neurological:     Mental Status: She is alert and oriented to person, place, and time.    Vitals:   03/05/19 0914 03/05/19 0953  BP: (!) 160/88 138/78  Pulse: 81   Temp: 98.2 F (36.8 C)   TempSrc: Oral   SpO2: 98%   Weight: 247 lb 6.4 oz (112.2 kg)   Height: 5\' 5"  (1.651 m)    Results for orders placed or performed in visit on 03/05/19  POCT urinalysis dipstick  Result Value Ref Range   Color, UA yellow yellow   Clarity, UA clear clear   Glucose, UA negative negative mg/dL   Bilirubin, UA negative negative   Ketones, POC UA negative negative mg/dL   Spec Grav, UA 1.015 1.010 - 1.025   Blood, UA negative negative   pH, UA 7.5 5.0 - 8.0   Protein Ur, POC negative negative mg/dL   Urobilinogen, UA 0.2 0.2 or 1.0 E.U./dL   Nitrite, UA Negative Negative   Leukocytes, UA Negative Negative       Assessment & Plan:  Veronica Martinez is a 63 y.o. female Right flank pain - Plan: POCT urinalysis dipstick  Reassuring exam, urinalysis.  Possible strain of paraspinal muscles.  Improving.  Symptomatic care and RTC precautions given.   No orders of the defined types were placed in this encounter.  Patient Instructions    I'm glad to hear that your symptoms are improving.  At this point I suspect it is probably a  muscle strain which should continue to improve.  Tylenol if needed, heat or ice to affected area and gentle range of motion as needed.  Return to the clinic or go to the nearest emergency room if any of your symptoms worsen or new symptoms occur.   If you have lab work done today you will be contacted with your lab results within the next 2 weeks.   If you have not heard from [place] then please contact us. The fastest way to get your results is to register for My Chart.   IF you received an x-ray today, you will receive an invoice from [facility]. Please contact [facility] at [phone] with questions or concerns regarding your invoice.   IF you received labwork today, you will receive an invoice from [facility]. Please contact [facility] at [phone] with questions or concerns regarding your invoice.   Our billing staff will not be able to assist you with questions regarding bills from these companies.  You will be contacted with the lab results as soon as they are available. The fastest way to get your results is to activate your My Chart account. Instructions are located on the last page of this paperwork. If you have not heard from [place] regarding the results in 2 weeks, please contact this office.       Signed,   [name], MD [facility] [place].  03/05/19 10:03 AM

## 2019-03-05 NOTE — Patient Instructions (Addendum)
  I'm glad to hear that your symptoms are improving.  At this point I suspect it is probably a  muscle strain which should continue to improve.  Tylenol if needed, heat or ice to affected area and gentle range of motion as needed.  Return to the clinic or go to the nearest emergency room if any of your symptoms worsen or new symptoms occur.   If you have lab work done today you will be contacted with your lab results within the next 2 weeks.  If you have not heard from [place] then please contact us. The fastest way to get your results is to register for My Chart.   IF you received an x-ray today, you will receive an invoice from [facility]. Please contact [facility] at [phone] with questions or concerns regarding your invoice.   IF you received labwork today, you will receive an invoice from [facility]. Please contact [facility] at [phone] with questions or concerns regarding your invoice.   Our billing staff will not be able to assist you with questions regarding bills from these companies.  You will be contacted with the lab results as soon as they are available. The fastest way to get your results is to activate your My Chart account. Instructions are located on the last page of this paperwork. If you have not heard from [place] regarding the results in 2 weeks, please contact this office.

## 2019-03-17 ENCOUNTER — Telehealth: Payer: Self-pay | Admitting: *Deleted

## 2019-03-17 NOTE — Telephone Encounter (Signed)
No voice mail  Called patient to schedule awv (telemed)

## 2019-03-31 ENCOUNTER — Other Ambulatory Visit: Payer: Self-pay | Admitting: Family Medicine

## 2019-03-31 DIAGNOSIS — E785 Hyperlipidemia, unspecified: Secondary | ICD-10-CM

## 2019-03-31 NOTE — Telephone Encounter (Signed)
Requested Prescriptions  Pending Prescriptions Disp Refills  . rosuvastatin (CRESTOR) 5 MG tablet [Pharmacy Med Name: ROSUVASTATIN 5MG  TABLETS] 90 tablet 2    Sig: TAKE 1 TABLET BY MOUTH DAILY AT 6 PM     Cardiovascular:  Antilipid - Statins Passed - 03/31/2019  4:10 PM      Passed - Total Cholesterol in normal range and within 360 days    Cholesterol, Total  Date Value Ref Range Status  08/20/2018 177 100 - 199 mg/dL Final         Passed - LDL in normal range and within 360 days    LDL Calculated  Date Value Ref Range Status  08/20/2018 59 0 - 99 mg/dL Final         Passed - HDL in normal range and within 360 days    HDL  Date Value Ref Range Status  08/20/2018 105 >39 mg/dL Final         Passed - Triglycerides in normal range and within 360 days    Triglycerides  Date Value Ref Range Status  08/20/2018 63 0 - 149 mg/dL Final         Passed - Patient is not pregnant      Passed - Valid encounter within last 12 months    Recent Outpatient Visits          3 weeks ago Right flank pain   [facility] [place], Ranell Patrick, MD   1 month ago Hypothyroidism, unspecified type   [facility] [place] [facility], Ranell Patrick, MD   7 months ago Hypothyroidism, unspecified type   [facility] [place] [facility], Ranell Patrick, MD   1 year ago Gastroesophageal reflux disease, esophagitis presence not specified   [facility], Ranell Patrick, MD   1 year ago Right sided sciatica   [facility] [place] [facility], Ranell Patrick, MD      Future Appointments            In 4 months Carlota Raspberry Ranell Patrick, MD [facility] [place], [facility]

## 2019-06-11 ENCOUNTER — Other Ambulatory Visit: Payer: Self-pay | Admitting: Obstetrics and Gynecology

## 2019-06-11 DIAGNOSIS — Z1231 Encounter for screening mammogram for malignant neoplasm of breast: Secondary | ICD-10-CM

## 2019-06-14 ENCOUNTER — Encounter: Payer: Self-pay | Admitting: Registered Nurse

## 2019-06-14 ENCOUNTER — Other Ambulatory Visit: Payer: Self-pay

## 2019-06-14 ENCOUNTER — Ambulatory Visit (INDEPENDENT_AMBULATORY_CARE_PROVIDER_SITE_OTHER): Payer: PPO | Admitting: Registered Nurse

## 2019-06-14 VITALS — BP 117/69 | HR 74 | Temp 98.7°F | Resp 16 | Ht 65.0 in | Wt 243.0 lb

## 2019-06-14 DIAGNOSIS — B351 Tinea unguium: Secondary | ICD-10-CM | POA: Diagnosis not present

## 2019-06-14 MED ORDER — EFINACONAZOLE 10 % EX SOLN: 1.0000 | Freq: Every day | CUTANEOUS | 0 refills | Status: DC

## 2019-06-14 MED ORDER — TERBINAFINE HCL 250 MG PO TABS: 250.0000 mg | ORAL_TABLET | Freq: Every day | ORAL | 0 refills | Status: DC

## 2019-06-14 NOTE — Patient Instructions (Signed)
° ° ° °  If you have lab work done today you will be contacted with your lab results within the next 2 weeks.  If you have not heard from us then please contact us. The fastest way to get your results is to register for My Chart. ° ° °IF you received an x-ray today, you will receive an invoice from [facility]. Please contact [facility] at [phone] with questions or concerns regarding your invoice.  ° °IF you received labwork today, you will receive an invoice from [facility]. Please contact [facility] at [phone] with questions or concerns regarding your invoice.  ° °Our billing staff will not be able to assist you with questions regarding bills from these companies. ° °You will be contacted with the lab results as soon as they are available. The fastest way to get your results is to activate your My Chart account. Instructions are located on the last page of this paperwork. If you have not heard from us regarding the results in 2 weeks, please contact this office. °  ° ° ° °

## 2019-06-14 NOTE — Progress Notes (Signed)
Established Patient Office Visit  Subjective:  Patient ID: Veronica Martinez, female    DOB: Jun 08, 1956  Age: 63 y.o. MRN: [id number]  CC:  Chief Complaint  Patient presents with  . Nail Problem    pt states both her big toes has been giving her pain and discoloration x 1 month     HPI Veronica Martinez presents for fungal infection of both great toes.  Onset 1 mo prior to visit. Tenderness to palpation and soreness at rest, as well as discoloration of around 50% of each great toenail. No spread to other toes at this time. No precipitating event. Pt is not immunocompromised or diabetic. Pt is concerned for aesthetics, given this and her pain, we will treat.  Past Medical History:  Diagnosis Date  . Allergy   . GERD (gastroesophageal reflux disease)   . Headache(784.0)    sinus  . High cholesterol   . History of Graves' disease   . Hx of radioactive iodine thyroid ablation   . Hypothyroidism     Past Surgical History:  Procedure Laterality Date  . ABDOMINAL HYSTERECTOMY    . APPENDECTOMY    . CATARACT EXTRACTION W/PHACO Right 09/22/2013   Procedure: CATARACT EXTRACTION PHACO AND INTRAOCULAR LENS PLACEMENT (IOC);  Surgeon: [name], MD;  Location: [place];  Service: Ophthalmology;  Laterality: Right;  . CATARACT EXTRACTION W/PHACO Left 12/29/2013   Procedure: CATARACT EXTRACTION PHACO AND INTRAOCULAR LENS PLACEMENT (IOC);  Surgeon: [name], MD;  Location: [place];  Service: Ophthalmology;  Laterality: Left;  . COLONOSCOPY    . EYE SURGERY    . HIP ARTHROPLASTY Bilateral   . JOINT REPLACEMENT    . OTHER SURGICAL HISTORY Right    carpal tunel injection  . SHOULDER SURGERY    . TONSILLECTOMY      Family History  Problem Relation Age of Onset  . Hypertension Mother   . Hyperlipidemia Mother   . Diabetes Sister   . Obesity Brother        gastric bypass 2018  . Thyroid disease Sister   . Obesity Sister     Social History   Socioeconomic History  . Marital status:  Married    Spouse name: Not on file  . Number of children: Not on file  . Years of education: Not on file  . Highest education level: Not on file  Occupational History  . Not on file  Social Needs  . Financial resource strain: Not on file  . Food insecurity    Worry: Not on file    Inability: Not on file  . Transportation needs    Medical: Not on file    Non-medical: Not on file  Tobacco Use  . Smoking status: Never Smoker  . Smokeless tobacco: Never Used  Substance and Sexual Activity  . Alcohol use: No  . Drug use: No  . Sexual activity: Yes    Partners: Male  Lifestyle  . Physical activity    Days per week: Not on file    Minutes per session: Not on file  . Stress: Not on file  Relationships  . Social Herbalist on phone: Not on file    Gets together: Not on file    Attends religious service: Not on file    Active member of club or organization: Not on file    Attends meetings of clubs or organizations: Not on file    Relationship status: Not on file  .  Intimate partner violence    Fear of current or ex partner: Not on file    Emotionally abused: Not on file    Physically abused: Not on file    Forced sexual activity: Not on file  Other Topics Concern  . Not on file  Social History Narrative   Married. Education: [employer].     Outpatient Medications Prior to Visit  Medication Sig Dispense Refill  . acetaminophen (TYLENOL) 650 MG CR tablet Take 650 mg by mouth every 8 (eight) hours as needed for pain.    . Cholecalciferol (VITAMIN D) 50 MCG (2000 UT) tablet Take 2,000 Units by mouth daily.    . Coenzyme Q10 (CO Q 10 PO) Take 75 mg by mouth 3 (three) times daily.    . fluticasone (FLONASE) 50 MCG/ACT nasal spray SPRAY 2 SPRAYS INTO EACH NOSTRIL EVERY DAY 16 g 0  . gabapentin (NEURONTIN) 600 MG tablet Take 1 tablet (600 mg total) by mouth 3 (three) times daily. 270 tablet 1  . Guaifenesin (MUCINEX MAXIMUM STRENGTH) 1200 MG TB12 Take 1 tablet (1,200  mg total) by mouth every 12 (twelve) hours as needed. 14 tablet 1  . ibuprofen (ADVIL,MOTRIN) 600 MG tablet Take 1 tablet (600 mg total) by mouth every 8 (eight) hours as needed (with food). 30 tablet 0  . Misc Natural Products (CALCIUM PLUS ADVANCED PO) Take 600 mg by mouth 2 (two) times daily.     . Multiple Vitamins-Minerals (CENTRUM SILVER PO) Take by mouth.    . Omega-3 Fatty Acids (FISH OIL) 1200 MG CAPS Take 1,200 mg by mouth 3 (three) times daily.     . pantoprazole (PROTONIX) 40 MG tablet Take 1 tablet (40 mg total) by mouth daily. 30 tablet 3  . rosuvastatin (CRESTOR) 5 MG tablet TAKE 1 TABLET BY MOUTH DAILY AT 6 PM 90 tablet 2  . SYNTHROID 100 MCG tablet Take 1 tablet (100 mcg total) by mouth daily before breakfast. 90 tablet 1  . vitamin E 400 UNIT capsule Take 400 Units by mouth daily.     No facility-administered medications prior to visit.     Allergies  Allergen Reactions  . Meloxicam Other (See Comments)    Causes Bruising   . Pravastatin Sodium Other (See Comments)    Bruises  . Simvastatin Other (See Comments)    Bruises   . Tramadol Hcl Other (See Comments)    Bruising     ROS Review of Systems Per hpi   Objective:    Physical Exam  Constitutional: She is oriented to person, place, and time. She appears well-developed and well-nourished. No distress.  Cardiovascular: Normal rate and regular rhythm.  Pulmonary/Chest: Effort normal. No respiratory distress.  Neurological: She is alert and oriented to person, place, and time.  Skin: Skin is warm and dry. No rash noted. She is not diaphoretic. No erythema. No pallor. Nails show no clubbing.  Darkened, yellowish, discoloration in both great toenails. Tender to palpation. Typical presentation for onychomycosis.  Psychiatric: She has a normal mood and affect. Her behavior is normal. Judgment and thought content normal.  Nursing note and vitals reviewed.   BP 117/69   Pulse 74   Temp 98.7 F (37.1 C) (Oral)    Resp 16   Ht 5\' 5"  (1.651 m)   Wt 243 lb (110.2 kg)   SpO2 98%   BMI 40.44 kg/m  Wt Readings from Last 3 Encounters:  06/14/19 243 lb (110.2 kg)  03/05/19 247 lb 6.4  oz (112.2 kg)  02/15/19 247 lb 9.6 oz (112.3 kg)     Health Maintenance Due  Topic Date Due  . DEXA SCAN  04/19/2019  . PNA vac Low Risk Adult (1 of 2 - PCV13) 04/19/2019    There are no preventive care reminders to display for this patient.  Lab Results  Component Value Date   TSH 2.640 02/15/2019   Lab Results  Component Value Date   WBC 5.7 05/10/2017   HGB 13.9 05/10/2017   HCT 41.2 05/10/2017   MCV 94 05/10/2017   PLT 219 05/10/2017   Lab Results  Component Value Date   NA 148 (H) 02/15/2019   K 5.1 02/15/2019   CO2 19 (L) 02/15/2019   GLUCOSE 94 02/15/2019   BUN 9 02/15/2019   CREATININE 0.79 02/15/2019   BILITOT 0.3 02/15/2019   ALKPHOS 76 02/15/2019   AST 19 02/15/2019   ALT 19 02/15/2019   PROT 7.4 02/15/2019   ALBUMIN 4.7 02/15/2019   CALCIUM 10.1 02/15/2019   Lab Results  Component Value Date   CHOL 177 08/20/2018   Lab Results  Component Value Date   HDL 105 08/20/2018   Lab Results  Component Value Date   LDLCALC 59 08/20/2018   Lab Results  Component Value Date   TRIG 63 08/20/2018   Lab Results  Component Value Date   CHOLHDL 1.7 08/20/2018   Lab Results  Component Value Date   HGBA1C 5.5 06/27/2016      Assessment & Plan:   Problem List Items Addressed This Visit    None    Visit Diagnoses    Onychomycosis    -  Primary   Relevant Medications   terbinafine (LAMISIL) 250 MG tablet   Efinaconazole 10 % SOLN      Meds ordered this encounter  Medications  . terbinafine (LAMISIL) 250 MG tablet    Sig: Take 1 tablet (250 mg total) by mouth daily.    Dispense:  84 tablet    Refill:  0    Order Specific Question:   Supervising Provider    Answer:   Delia Chimes A [id number]  . Efinaconazole 10 % SOLN    Sig: Apply 1 Dose topically daily.     Dispense:  4 mL    Refill:  0    Order Specific Question:   Supervising Provider    Answer:   Forrest Moron [id number]    Follow-up: No follow-ups on file.   PLAN:  Oral terbinafine 250 mg qd for 12 weeks  Topical efinaconazole qd for 30 days/until tube runs out.   If symptoms worsen, fail to improve, or change, contact clinic: either myself or PCP Dr. Carlota Raspberry.   Discussed nonpharm: keep nails clean, dry, open to air when possible.   Patient encouraged to call clinic with any questions, comments, or concerns.   Maximiano Coss, NP

## 2019-07-09 ENCOUNTER — Other Ambulatory Visit: Payer: Self-pay

## 2019-07-09 DIAGNOSIS — E039 Hypothyroidism, unspecified: Secondary | ICD-10-CM

## 2019-07-09 MED ORDER — SYNTHROID 100 MCG PO TABS: 100.0000 ug | ORAL_TABLET | Freq: Every day | ORAL | 0 refills | Status: DC

## 2019-07-27 ENCOUNTER — Ambulatory Visit
Admission: RE | Admit: 2019-07-27 | Discharge: 2019-07-27 | Disposition: A | Discharge status: Home / Self Care | Payer: PPO | Source: Ambulatory Visit | Attending: Obstetrics and Gynecology | Admitting: Obstetrics and Gynecology

## 2019-07-27 ENCOUNTER — Other Ambulatory Visit: Payer: Self-pay

## 2019-07-27 DIAGNOSIS — Z1231 Encounter for screening mammogram for malignant neoplasm of breast: Secondary | ICD-10-CM

## 2019-08-03 ENCOUNTER — Ambulatory Visit (INDEPENDENT_AMBULATORY_CARE_PROVIDER_SITE_OTHER): Payer: PPO | Admitting: Family Medicine

## 2019-08-03 VITALS — BP 117/69 | Ht 66.0 in | Wt 243.0 lb

## 2019-08-03 DIAGNOSIS — Z0000 Encounter for general adult medical examination without abnormal findings: Secondary | ICD-10-CM | POA: Diagnosis not present

## 2019-08-03 NOTE — Progress Notes (Addendum)
Presents today for [employer] Visit   Date of last exam: 07/09/2019  Interpreter used for this visit? No  I connected with  Veronica Martinez on 08/03/19 by a telephone  and verified that I am speaking with the correct person using two identifiers.   .   Patient Care Team: Wendie Agreste, MD as PCP - General (Family Medicine) Warden Fillers, MD as Consulting Physician (Ophthalmology) Ronald Lobo, MD as Consulting Physician (Gastroenterology)   Other items to address today:   Discussed Eye/Dental Discussed immunizations Will follow up with Dr. Lavella Hammock 08-18-2019    Other Screening:  Last lipid screening: 08/20/2018  ADVANCE DIRECTIVES: Discussed: yes On File: no Materials Provided: yes (mailed)  Immunization status:  Immunization History  Administered Date(s) Administered   Influenza Split 09/01/2012   Influenza,inj,Quad PF,6+ Mos 09/07/2013, 01/10/2015, 12/26/2015, 08/21/2017, 02/15/2019   Tdap 01/10/2015     Health Maintenance Due  Topic Date Due   INFLUENZA VACCINE  07/24/2019     Functional Status Survey: Is the patient deaf or have difficulty hearing?: No Does the patient have difficulty seeing, even when wearing glasses/contacts?: No Does the patient have difficulty concentrating, remembering, or making decisions?: No Does the patient have difficulty walking or climbing stairs?: No Does the patient have difficulty dressing or bathing?: No Does the patient have difficulty doing errands alone such as visiting a doctor's office or shopping?: No    6CIT Screen 08/03/2019 08/21/2017  What Year? 0 points 0 points  What month? 0 points 0 points  What time? 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 08/10/2013   Right hip pain 08/09/2013   Trigger finger, acquired 04/22/2013   Carpal tunnel syndrome of right wrist 04/22/2013   Pain of right heel 12/08/2012   Myalgia 11/10/2012   OBESITY, UNSPECIFIED 02/20/2011   BACK PAIN, LUMBAR 07/05/2010   ABNORMALITY OF GAIT 05/02/2010   MERALGIA PARESTHETICA 03/02/2010   HIP PAIN, BILATERAL 03/02/2010     Past Medical History:  Diagnosis Date   Allergy    GERD (gastroesophageal reflux disease)    Headache(784.0)    sinus   High cholesterol    History of Graves' disease    Hx of radioactive iodine thyroid ablation    Hypothyroidism      Past Surgical History:  Procedure Laterality Date   ABDOMINAL HYSTERECTOMY     APPENDECTOMY     CATARACT EXTRACTION W/PHACO Right 09/22/2013   Procedure: CATARACT EXTRACTION PHACO AND INTRAOCULAR LENS PLACEMENT ([place]);  Surgeon: [name], MD;  Location: [place];  Service: Ophthalmology;  Laterality: Right;   CATARACT EXTRACTION W/PHACO Left 12/29/2013   Procedure: CATARACT EXTRACTION PHACO AND INTRAOCULAR LENS PLACEMENT (IOC);  Surgeon: [name], MD;  Location: [place];  Service: Ophthalmology;  Laterality: Left;   COLONOSCOPY     EYE SURGERY  HIP ARTHROPLASTY Bilateral    JOINT REPLACEMENT     OTHER SURGICAL HISTORY Right    carpal tunel injection   SHOULDER SURGERY     TONSILLECTOMY       Family History  Problem Relation Age of Onset   Hypertension Mother    Hyperlipidemia Mother    Diabetes Sister    Obesity Brother        gastric bypass 2018   Thyroid disease Sister    Obesity Sister      Social History   Socioeconomic History   Marital status: Married    Spouse name: Not on file    Number of children: Not on file   Years of education: Not on file   Highest education level: Not on file  Occupational History   Not on file  Social Needs   Financial resource strain: Not on file   Food insecurity    Worry: Not on file    Inability: Not on file   Transportation needs    Medical: Not on file    Non-medical: Not on file  Tobacco Use   Smoking status: Never Smoker   Smokeless tobacco: Never Used  Substance and Sexual Activity   Alcohol use: No   Drug use: No   Sexual activity: Yes    Partners: Male  Lifestyle   Physical activity    Days per week: Not on file    Minutes per session: Not on file   Stress: Not on file  Relationships   Social connections    Talks on phone: Not on file    Gets together: Not on file    Attends religious service: Not on file    Active member of club or organization: Not on file    Attends meetings of clubs or organizations: Not on file    Relationship status: Not on file   Intimate partner violence    Fear of current or ex partner: Not on file    Emotionally abused: Not on file    Physically abused: Not on file    Forced sexual activity: Not on file  Other Topics Concern   Not on file  Social History Narrative   Married. Education: [employer].      Allergies  Allergen Reactions   Meloxicam Other (See Comments)    Causes Bruising    Pravastatin Sodium Other (See Comments)    Bruises   Simvastatin Other (See Comments)    Bruises    Tramadol Hcl Other (See Comments)    Bruising      Prior to Admission medications   Medication Sig Start Date End Date Taking? Authorizing Provider  acetaminophen (TYLENOL) 650 MG CR tablet Take 650 mg by mouth every 8 (eight) hours as needed for pain.   Yes [provider]  Cholecalciferol (VITAMIN D) 50 MCG (2000 UT) tablet Take 2,000 Units by mouth daily.   Yes [provider]  Coenzyme Q10 (CO Q 10 PO) Take 75 mg by mouth 3 (three) times daily.   Yes [provider]  Efinaconazole 10 % SOLN Apply 1 Dose topically daily. 06/14/19  Yes Maximiano Coss, NP  fluticasone [facility]) 50 MCG/ACT nasal spray SPRAY 2 SPRAYS INTO EACH NOSTRIL EVERY DAY 10/15/18  Yes Wendie Agreste, MD  gabapentin (NEURONTIN) 600 MG tablet Take 1 tablet (600 mg total) by mouth 3 (three) times daily. 08/20/18  Yes Wendie Agreste, MD  Misc Natural Products (CALCIUM PLUS ADVANCED PO) Take 600  mg by mouth 2 (two) times daily.    Yes [provider]  Multiple Vitamins-Minerals (CENTRUM SILVER PO) Take by mouth.   Yes [provider]  Omega-3 Fatty Acids (FISH OIL) 1200 MG CAPS Take 1,200 mg by mouth 3 (three) times daily.    Yes [provider]  pantoprazole (PROTONIX) 40 MG tablet Take 1 tablet (40 mg total) by mouth daily. 01/16/17  Yes Wendie Agreste, MD  rosuvastatin (CRESTOR) 5 MG tablet TAKE 1 TABLET BY MOUTH DAILY AT 6 PM 03/31/19  Yes Wendie Agreste, MD  SYNTHROID 100 MCG tablet Take 1 tablet (100 mcg total) by mouth daily before breakfast. 07/09/19  Yes Wendie Agreste, MD  terbinafine (LAMISIL) 250 MG tablet Take 1 tablet (250 mg total) by mouth daily. 06/14/19  Yes Maximiano Coss, NP  vitamin E 400 UNIT capsule Take 400 Units by mouth daily.   Yes [provider]  Guaifenesin (MUCINEX MAXIMUM STRENGTH) 1200 MG TB12 Take 1 tablet (1,200 mg total) by mouth every 12 (twelve) hours as needed. Patient not taking: Reported on 08/03/2019 05/10/16   Ivar Drape D, PA  ibuprofen (ADVIL,MOTRIN) 600 MG tablet Take 1 tablet (600 mg total) by mouth every 8 (eight) hours as needed (with food). Patient not taking: Reported on 08/03/2019 10/21/17   Wendie Agreste, MD     Depression screen [facility] 2/9 08/03/2019 06/14/2019 03/05/2019 02/15/2019 08/20/2018  Decreased Interest 0 0 0 0 0  Down, Depressed, Hopeless 0 0 0 0 0  PHQ - 2 Score 0 0 0 0 0     Fall Risk  08/03/2019 06/14/2019 03/05/2019 02/15/2019 08/20/2018  Falls in the past year? 0  0 0 0 No  Number falls in past yr: 0 - 0 0 -  Injury with Fall? 0 0 0 0 -  Risk for fall due to : - - - - -  Follow up Falls evaluation completed;Education provided;Falls prevention discussed - Falls evaluation completed Falls evaluation completed -      PHYSICAL EXAM: BP 117/69 Comment: patient taking at home  Ht 5\' 6"  (1.676 m)   Wt 243 lb (110.2 kg)   BMI 39.22 kg/m    Wt Readings from Last 3 Encounters:  08/03/19 243 lb (110.2 kg)  06/14/19 243 lb (110.2 kg)  03/05/19 247 lb 6.4 oz (112.2 kg)     No exam data present    Physical Exam  Medicare annual wellness visit, subsequent -  Education/Counseling provided regarding diet and exercise, prevention of chronic diseases, smoking/tobacco cessation, if applicable, and reviewed "Covered Medicare Preventive Services."

## 2019-08-03 NOTE — Patient Instructions (Signed)
Thank you for taking time to come for your Medicare Wellness Visit. I appreciate your ongoing commitment to your health goals. Please review the following plan we discussed and let me know if I can assist you in the future.  Veronica Lomeli LPN  Preventive Care 65-64 Years Old, Female Preventive care refers to visits with your health care provider and lifestyle choices that can promote health and wellness. This includes:  A yearly physical exam. This may also be called an annual well check.  Regular dental visits and eye exams.  Immunizations.  Screening for certain conditions.  Healthy lifestyle choices, such as eating a healthy diet, getting regular exercise, not using drugs or products that contain nicotine and tobacco, and limiting alcohol use. What can I expect for my preventive care visit? Physical exam Your health care provider will check your:  Height and weight. This may be used to calculate body mass index (BMI), which tells if you are at a healthy weight.  Heart rate and blood pressure.  Skin for abnormal spots. Counseling Your health care provider may ask you questions about your:  Alcohol, tobacco, and drug use.  Emotional well-being.  Home and relationship well-being.  Sexual activity.  Eating habits.  Work and work environment.  Method of birth control.  Menstrual cycle.  Pregnancy history. What immunizations do I need?  Influenza (flu) vaccine  This is recommended every year. Tetanus, diphtheria, and pertussis (Tdap) vaccine  You may need a Td booster every 10 years. Varicella (chickenpox) vaccine  You may need this if you have not been vaccinated. Zoster (shingles) vaccine  You may need this after age 65. Measles, mumps, and rubella (MMR) vaccine  You may need at least one dose of MMR if you were born in 1957 or later. You may also need a second dose. Pneumococcal conjugate (PCV13) vaccine  You may need this if you have certain conditions  and were not previously vaccinated. Pneumococcal polysaccharide (PPSV23) vaccine  You may need one or two doses if you smoke cigarettes or if you have certain conditions. Meningococcal conjugate (MenACWY) vaccine  You may need this if you have certain conditions. Hepatitis A vaccine  You may need this if you have certain conditions or if you travel or work in places where you may be exposed to hepatitis A. Hepatitis B vaccine  You may need this if you have certain conditions or if you travel or work in places where you may be exposed to hepatitis B. Haemophilus influenzae type b (Hib) vaccine  You may need this if you have certain conditions. Human papillomavirus (HPV) vaccine  If recommended by your health care provider, you may need three doses over 6 months. You may receive vaccines as individual doses or as more than one vaccine together in one shot (combination vaccines). Talk with your health care provider about the risks and benefits of combination vaccines. What tests do I need? Blood tests  Lipid and cholesterol levels. These may be checked every 5 years, or more frequently if you are over 50 years old.  Hepatitis C test.  Hepatitis B test. Screening  Lung cancer screening. You may have this screening every year starting at age 65 if you have a 30-pack-year history of smoking and currently smoke or have quit within the past 15 years.  Colorectal cancer screening. All adults should have this screening starting at age 65 and continuing until age 65. Your health care provider may recommend screening at age 45 if you are   at increased risk. You will have tests every 1-10 years, depending on your results and the type of screening test.  Diabetes screening. This is done by checking your blood sugar (glucose) after you have not eaten for a while (fasting). You may have this done every 1-3 years.  Mammogram. This may be done every 1-2 years. Talk with your health care provider  about when you should start having regular mammograms. This may depend on whether you have a family history of breast cancer.  BRCA-related cancer screening. This may be done if you have a family history of breast, ovarian, tubal, or peritoneal cancers.  Pelvic exam and Pap test. This may be done every 3 years starting at age 21. Starting at age 30, this may be done every 5 years if you have a Pap test in combination with an HPV test. Other tests  Sexually transmitted disease (STD) testing.  Bone density scan. This is done to screen for osteoporosis. You may have this scan if you are at high risk for osteoporosis. Follow these instructions at home: Eating and drinking  Eat a diet that includes fresh fruits and vegetables, whole grains, lean protein, and low-fat dairy.  Take vitamin and mineral supplements as recommended by your health care provider.  Do not drink alcohol if: ? Your health care provider tells you not to drink. ? You are pregnant, may be pregnant, or are planning to become pregnant.  If you drink alcohol: ? Limit how much you have to 0-1 drink a day. ? Be aware of how much alcohol is in your drink. In the [place] one drink equals one 12 oz bottle of beer (355 mL), one 5 oz glass of wine (148 mL), or one 1 oz glass of hard liquor (44 mL). Lifestyle  Take daily care of your teeth and gums.  Stay active. Exercise for at least 30 minutes on 5 or more days each week.  Do not use any products that contain nicotine or tobacco, such as cigarettes, e-cigarettes, and chewing tobacco. If you need help quitting, ask your health care provider.  If you are sexually active, practice safe sex. Use a condom or other form of birth control (contraception) in order to prevent pregnancy and STIs (sexually transmitted infections).  If told by your health care provider, take low-dose aspirin daily starting at age 65. What's next?  Visit your health care provider once a year for a well  check visit.  Ask your health care provider how often you should have your eyes and teeth checked.  Stay up to date on all vaccines. This information is not intended to replace advice given to you by your health care provider. Make sure you discuss any questions you have with your health care provider. Document Released: 01/05/2016 Document Revised: 08/20/2018 Document Reviewed: 08/20/2018 Elsevier Patient Education  2020 [employer]  

## 2019-08-17 ENCOUNTER — Encounter: Payer: PPO | Admitting: Family Medicine

## 2019-08-18 ENCOUNTER — Encounter: Payer: Self-pay | Admitting: Family Medicine

## 2019-08-18 ENCOUNTER — Other Ambulatory Visit: Payer: Self-pay

## 2019-08-18 ENCOUNTER — Ambulatory Visit (INDEPENDENT_AMBULATORY_CARE_PROVIDER_SITE_OTHER): Payer: PPO | Admitting: Family Medicine

## 2019-08-18 VITALS — BP 142/78 | HR 87 | Temp 98.5°F | Resp 14 | Wt 247.2 lb

## 2019-08-18 DIAGNOSIS — G5710 Meralgia paresthetica, unspecified lower limb: Secondary | ICD-10-CM | POA: Diagnosis not present

## 2019-08-18 DIAGNOSIS — E2839 Other primary ovarian failure: Secondary | ICD-10-CM

## 2019-08-18 DIAGNOSIS — K219 Gastro-esophageal reflux disease without esophagitis: Secondary | ICD-10-CM | POA: Diagnosis not present

## 2019-08-18 DIAGNOSIS — E785 Hyperlipidemia, unspecified: Secondary | ICD-10-CM

## 2019-08-18 DIAGNOSIS — Z23 Encounter for immunization: Secondary | ICD-10-CM

## 2019-08-18 DIAGNOSIS — M792 Neuralgia and neuritis, unspecified: Secondary | ICD-10-CM | POA: Diagnosis not present

## 2019-08-18 DIAGNOSIS — E039 Hypothyroidism, unspecified: Secondary | ICD-10-CM | POA: Diagnosis not present

## 2019-08-18 DIAGNOSIS — Z780 Asymptomatic menopausal state: Secondary | ICD-10-CM

## 2019-08-18 DIAGNOSIS — R7989 Other specified abnormal findings of blood chemistry: Secondary | ICD-10-CM | POA: Diagnosis not present

## 2019-08-18 MED ORDER — SYNTHROID 100 MCG PO TABS: 100.0000 ug | ORAL_TABLET | Freq: Every day | ORAL | 2 refills | Status: DC

## 2019-08-18 MED ORDER — ROSUVASTATIN CALCIUM 5 MG PO TABS: ORAL_TABLET | ORAL | 2 refills | Status: DC

## 2019-08-18 MED ORDER — SHINGRIX 50 MCG/0.5ML IM SUSR: 0.5000 mL | Freq: Once | INTRAMUSCULAR | 1 refills | Status: AC

## 2019-08-18 MED ORDER — GABAPENTIN 600 MG PO TABS: 600.0000 mg | ORAL_TABLET | Freq: Two times a day (BID) | ORAL | 2 refills | Status: DC

## 2019-08-18 NOTE — Progress Notes (Signed)
Subjective:    Patient ID: Veronica Martinez, female    DOB: [birth year], 63 y.o.   MRN: [id number]  HPI Veronica Martinez is a 63 y.o. female Presents today for: Chief Complaint  Patient presents with  . Annual Exam    Here for annual   History of Medicare annual wellness exam on August 11. Hyperlipidemia:  Lab Results  Component Value Date   CHOL 177 08/20/2018   HDL 105 08/20/2018   LDLCALC 59 08/20/2018   TRIG 63 08/20/2018   CHOLHDL 1.7 08/20/2018   Lab Results  Component Value Date   ALT 19 02/15/2019   AST 19 02/15/2019   ALKPHOS 76 02/15/2019   BILITOT 0.3 02/15/2019  Crestor 5 mg daily. No new side effects.   GERD Protonix 40 mg BID Followed by gastroenterology, Dr. [name] previously.  Has remained on vitamin D supplement - reading of 36.2 in February. rx by GI.   Onychomycosis.  Treated with lamisil since 6/22. Discoloration improving.  Lab Results  Component Value Date   ALT 19 02/15/2019   AST 19 02/15/2019   ALKPHOS 76 02/15/2019   BILITOT 0.3 02/15/2019    Meralgia paresthetica Takes gabapentin 2-3 times per day-  managed with 2 pills per day, rare flair that resolves with alleve.   Hypothyroidism: Lab Results  Component Value Date   TSH 2.640 02/15/2019  synthroid 142mcg per day.  Weigh up and down at times. Taking medication daily.  No new hot or cold intolerance. No new hair or skin changes, heart palpitations or new fatigue.   History of elevated ferritin.  Low vitamin D Ferritin 184 in February, normal vitamin D at that time.  Mild elevated sodium at that time at 148, plan for recheck, but has not been checked recently.   Cancer screening: Colonoscopy 10/25/2016, repeat 10 years, mammogram 07/27/2019, Pap testing 05/12/2017, repeat 3 years.  Immunization History  Administered Date(s) Administered  . Influenza Split 09/01/2012  . Influenza,inj,Quad PF,6+ Mos 09/07/2013, 01/10/2015, 12/26/2015, 08/21/2017, 02/15/2019  . Tdap 01/10/2015   Flu: agrees to vaccine.  Shingles: has not had - agrees Prevnar/pneumonia vaccination:  Due for DEXA scan:  Agrees to referral.    Depression screen [facility] 2/9 08/18/2019 08/03/2019 06/14/2019 03/05/2019 02/15/2019  Decreased Interest 0 0 0 0 0  Down, Depressed, Hopeless 0 0 0 0 0  PHQ - 2 Score 0 0 0 0 0    Hearing Screening   125Hz  250Hz  500Hz  1000Hz  2000Hz  3000Hz  4000Hz  6000Hz  8000Hz   Right ear:           Left ear:             Visual Acuity Screening   Right eye Left eye Both eyes  Without correction:     With correction: 20/25 20/25 20/25     Patient Active Problem List   Diagnosis Date Noted  . 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 Right hip pain 08/09/2013  . Trigger finger, acquired 04/22/2013  . Carpal tunnel syndrome of right wrist 04/22/2013  . Pain of right heel 12/08/2012  . Myalgia 11/10/2012  . OBESITY, UNSPECIFIED 02/20/2011  . BACK PAIN, LUMBAR 07/05/2010  . ABNORMALITY OF GAIT 05/02/2010  . MERALGIA PARESTHETICA 03/02/2010  . HIP PAIN, BILATERAL 03/02/2010   Past Medical History:  Diagnosis Date  . Allergy   . GERD (gastroesophageal reflux disease)   . Headache(784.0)    sinus  . High cholesterol   .  History of Graves' disease   . Hx of radioactive iodine thyroid ablation   . Hypothyroidism    Past Surgical History:  Procedure Laterality Date  . ABDOMINAL HYSTERECTOMY    . APPENDECTOMY    . CATARACT EXTRACTION W/PHACO Right 09/22/2013   Procedure: CATARACT EXTRACTION PHACO AND INTRAOCULAR LENS PLACEMENT (IOC);  Surgeon: [name], MD;  Location: [place];  Service: Ophthalmology;  Laterality: Right;  . CATARACT EXTRACTION W/PHACO Left 12/29/2013   Procedure: CATARACT EXTRACTION PHACO AND INTRAOCULAR LENS PLACEMENT (IOC);  Surgeon: [name], MD;  Location: [place];  Service: Ophthalmology;  Laterality: Left;  . COLONOSCOPY    . EYE SURGERY    . HIP ARTHROPLASTY Bilateral   . JOINT  REPLACEMENT    . OTHER SURGICAL HISTORY Right    carpal tunel injection  . SHOULDER SURGERY    . TONSILLECTOMY     Allergies  Allergen Reactions  . Meloxicam Other (See Comments)    Causes Bruising   . Pravastatin Sodium Other (See Comments)    Bruises  . Simvastatin Other (See Comments)    Bruises   . Tramadol Hcl Other (See Comments)    Bruising    Prior to Admission medications   Medication Sig Start Date End Date Taking? Authorizing Provider  acetaminophen (TYLENOL) 650 MG CR tablet Take 650 mg by mouth every 8 (eight) hours as needed for pain.    [provider]  Cholecalciferol (VITAMIN D) 50 MCG (2000 UT) tablet Take 2,000 Units by mouth daily.    [provider]  Coenzyme Q10 (CO Q 10 PO) Take 75 mg by mouth 3 (three) times daily.    [provider]  Efinaconazole 10 % SOLN Apply 1 Dose topically daily. 06/14/19   Maximiano Coss, NP  fluticasone [facility]) 50 MCG/ACT nasal spray SPRAY 2 SPRAYS INTO EACH NOSTRIL EVERY DAY 10/15/18   Wendie Agreste, MD  gabapentin (NEURONTIN) 600 MG tablet Take 1 tablet (600 mg total) by mouth 3 (three) times daily. 08/20/18   Wendie Agreste, MD  Guaifenesin [facility] MAXIMUM STRENGTH) 1200 MG TB12 Take 1 tablet (1,200 mg total) by mouth every 12 (twelve) hours as needed. Patient not taking: Reported on 08/03/2019 05/10/16   Ivar Drape D, PA  ibuprofen (ADVIL,MOTRIN) 600 MG tablet Take 1 tablet (600 mg total) by mouth every 8 (eight) hours as needed (with food). Patient not taking: Reported on 08/03/2019 10/21/17   Wendie Agreste, MD  Misc Natural Products (CALCIUM PLUS ADVANCED PO) Take 600 mg by mouth 2 (two) times daily.     [provider]  Multiple Vitamins-Minerals (CENTRUM SILVER PO) Take by mouth.    [provider]  Omega-3 Fatty Acids (FISH OIL) 1200 MG CAPS Take 1,200 mg by mouth 3 (three) times daily.     [provider]  pantoprazole (PROTONIX) 40 MG tablet Take 1  tablet (40 mg total) by mouth daily. 01/16/17   Wendie Agreste, MD  rosuvastatin (CRESTOR) 5 MG tablet TAKE 1 TABLET BY MOUTH DAILY AT 6 PM 03/31/19   Wendie Agreste, MD  SYNTHROID 100 MCG tablet Take 1 tablet (100 mcg total) by mouth daily before breakfast. 07/09/19   Wendie Agreste, MD  terbinafine (LAMISIL) 250 MG tablet Take 1 tablet (250 mg total) by mouth daily. 06/14/19   Maximiano Coss, NP  vitamin E 400 UNIT capsule Take 400 Units by mouth daily.    [provider]   Social History   Socioeconomic  History  . Marital status: Married    Spouse name: Not on file  . Number of children: Not on file  . Years of education: Not on file  . Highest education level: Not on file  Occupational History  . Not on file  Social Needs  . Financial resource strain: Not on file  . Food insecurity    Worry: Not on file    Inability: Not on file  . Transportation needs    Medical: Not on file    Non-medical: Not on file  Tobacco Use  . Smoking status: Never Smoker  . Smokeless tobacco: Never Used  Substance and Sexual Activity  . Alcohol use: No  . Drug use: No  . Sexual activity: Yes    Partners: Male  Lifestyle  . Physical activity    Days per week: Not on file    Minutes per session: Not on file  . Stress: Not on file  Relationships  . Social Herbalist on phone: Not on file    Gets together: Not on file    Attends religious service: Not on file    Active member of club or organization: Not on file    Attends meetings of clubs or organizations: Not on file    Relationship status: Not on file  . Intimate partner violence    Fear of current or ex partner: Not on file    Emotionally abused: Not on file    Physically abused: Not on file    Forced sexual activity: Not on file  Other Topics Concern  . Not on file  Social History Narrative   Married. Education: [employer].     Review of Systems     Objective:   Physical Exam Vitals signs reviewed.   Constitutional:      Appearance: She is well-developed.  HENT:     Head: Normocephalic and atraumatic.  Eyes:     Conjunctiva/sclera: Conjunctivae normal.     Pupils: Pupils are equal, round, and reactive to light.  Neck:     Vascular: No carotid bruit.  Cardiovascular:     Rate and Rhythm: Normal rate and regular rhythm.     Heart sounds: Normal heart sounds.  Pulmonary:     Effort: Pulmonary effort is normal.     Breath sounds: Normal breath sounds.  Abdominal:     Palpations: Abdomen is soft. There is no pulsatile mass.     Tenderness: There is no abdominal tenderness.  Skin:    General: Skin is warm and dry.     Comments: Discoloration/thickening of the distal third of the great toenails bilaterally.  Neurological:     Mental Status: She is alert and oriented to person, place, and time.     Comments: Negative seated straight leg raise.  No pain/dysesthesias across anterior thighs at present.  Psychiatric:        Behavior: Behavior normal.    Vitals:   08/18/19 0948 08/18/19 0951  BP: (!) 178/67 (!) 142/78  Pulse: 87   Resp: 14   Temp: 98.5 F (36.9 C)   TempSrc: Oral   SpO2: 96%   Weight: 247 lb 3.2 oz (112.1 kg)   Home readings have been normal, tends to have elevated blood pressure when she comes into the doctor's office only.    Assessment & Plan:   Veronica Martinez is a 63 y.o. female Hyperlipidemia, unspecified hyperlipidemia type - Plan: Comprehensive metabolic panel, Lipid panel, rosuvastatin (CRESTOR)  5 MG tablet  -Tolerating rosuvastatin.  Continue same dosing, labs pending  Gastroesophageal reflux disease, esophagitis presence not specified - Plan: Ferritin Elevated ferritin  -Continue PPI, ongoing follow-up with gastroenterology, recheck ferritin, if persistent elevation may need to discuss with gastroenterology, liver evaluation.  Hypothyroidism, unspecified type - Plan: TSH + free T4, SYNTHROID 100 MCG tablet  -  Stable, tolerating current regimen.  Medications refilled. Labs pending as above.   Nerve pain - Plan: gabapentin (NEURONTIN) 600 MG tablet Meralgia paresthetica, unspecified laterality  -  Stable, tolerating current regimen. Medications refilled. Labs pending as above.   Estrogen deficiency - Plan: DG Bone Density Postmenopausal - Plan: DG Bone Density  -Check bone density  Need for shingles vaccine - Plan: Zoster Vaccine Adjuvanted [facility]) injection sent to pharmacy  Needs flu shot - Plan: Flu Vaccine QUAD High Dose(Fluad)  Need for prophylactic vaccination against Streptococcus pneumoniae (pneumococcus) - Plan: Pneumococcal conjugate vaccine 13-valent IM   Meds ordered this encounter  Medications  . Zoster Vaccine Adjuvanted [facility]) injection    Sig: Inject 0.5 mLs into the muscle once for 1 dose. Repeat in 2-6 months.    Dispense:  0.5 mL    Refill:  1  . rosuvastatin (CRESTOR) 5 MG tablet    Sig: TAKE 1 TABLET BY MOUTH DAILY AT 6 PM    Dispense:  90 tablet    Refill:  2  . SYNTHROID 100 MCG tablet    Sig: Take 1 tablet (100 mcg total) by mouth daily before breakfast.    Dispense:  90 tablet    Refill:  2  . gabapentin (NEURONTIN) 600 MG tablet    Sig: Take 1 tablet (600 mg total) by mouth 2 (two) times daily.    Dispense:  180 tablet    Refill:  2    Please consider 90 day supplies to promote better adherence   Patient Instructions   No change in medications at this time.  Continue the Lamisil for the foot fungus.  If the nails have not improved in the next month, could consider sending a prescription for another 6 weeks, please let me know.  Shingles vaccine sent to pharmacy, bone density test was ordered, you should receive a call in the next 2 weeks.  Flu shot and pneumonia vaccine given today.  Follow-up with me in 6 months.  Let me know if there are questions before that time and take care.   If you have lab work done today you will be contacted with your lab results within the next 2 weeks.   If you have not heard from [place] then please contact us. The fastest way to get your results is to register for My Chart.   IF you received an x-ray today, you will receive an invoice from [facility]. Please contact [facility] at [phone] with questions or concerns regarding your invoice.   IF you received labwork today, you will receive an invoice from [place]. Please contact [facility] at [phone] with questions or concerns regarding your invoice.   Our billing staff will not be able to assist you with questions regarding bills from these companies.  You will be contacted with the lab results as soon as they are available. The fastest way to get your results is to activate your My Chart account. Instructions are located on the last page of this paperwork. If you have not heard from [place] regarding the results in 2 weeks, please contact this office.  Signed,   [name], MD [facility] at [place].  08/18/19 10:43 AM

## 2019-08-18 NOTE — Patient Instructions (Addendum)
No change in medications at this time.  Continue the Lamisil for the foot fungus.  If the nails have not improved in the next month, could consider sending a prescription for another 6 weeks, please let me know.  Shingles vaccine sent to pharmacy, bone density test was ordered, you should receive a call in the next 2 weeks.  Flu shot and pneumonia vaccine given today.  Monitor your blood pressures at home.  If things are over 140/90 follow-up with your blood pressure meter to discuss further.  Follow-up with me in 6 months.  Let me know if there are questions before that time and take care.  Return to the clinic or go to the nearest emergency room if any of your symptoms worsen or new symptoms occur.   If you have lab work done today you will be contacted with your lab results within the next 2 weeks.  If you have not heard from [place] then please contact us. The fastest way to get your results is to register for My Chart.   IF you received an x-ray today, you will receive an invoice from [facility]. Please contact [facility] at [phone] with questions or concerns regarding your invoice.   IF you received labwork today, you will receive an invoice from [place]. Please contact [facility] at [phone] with questions or concerns regarding your invoice.   Our billing staff will not be able to assist you with questions regarding bills from these companies.  You will be contacted with the lab results as soon as they are available. The fastest way to get your results is to activate your My Chart account. Instructions are located on the last page of this paperwork. If you have not heard from [place] regarding the results in 2 weeks, please contact this office.

## 2019-08-19 LAB — COMPREHENSIVE METABOLIC PANEL WITH GFR
ALT: 18 IU/L (ref 0–32)
AST: 22 IU/L (ref 0–40)
Albumin/Globulin Ratio: 1.8 (ref 1.2–2.2)
Albumin: 5 g/dL — ABNORMAL HIGH (ref 3.8–4.8)
Alkaline Phosphatase: 84 IU/L (ref 39–117)
BUN/Creatinine Ratio: 11 — ABNORMAL LOW (ref 12–28)
BUN: 9 mg/dL (ref 8–27)
Bilirubin Total: 0.3 mg/dL (ref 0.0–1.2)
CO2: 24 mmol/L (ref 20–29)
Calcium: 10 mg/dL (ref 8.7–10.3)
Chloride: 101 mmol/L (ref 96–106)
Creatinine, Ser: 0.81 mg/dL (ref 0.57–1.00)
GFR calc Af Amer: 88 mL/min/1.73
GFR calc non Af Amer: 76 mL/min/1.73
Globulin, Total: 2.8 g/dL (ref 1.5–4.5)
Glucose: 92 mg/dL (ref 65–99)
Potassium: 5 mmol/L (ref 3.5–5.2)
Sodium: 140 mmol/L (ref 134–144)
Total Protein: 7.8 g/dL (ref 6.0–8.5)

## 2019-08-19 LAB — LIPID PANEL
Chol/HDL Ratio: 1.7 ratio (ref 0.0–4.4)
Cholesterol, Total: 190 mg/dL (ref 100–199)
HDL: 112 mg/dL (ref >39)
LDL Calculated: 63 mg/dL (ref 0–99)
Triglycerides: 73 mg/dL (ref 0–149)
VLDL Cholesterol Cal: 15 mg/dL (ref 5–40)

## 2019-08-19 LAB — TSH+FREE T4
Free T4: 1.65 ng/dL (ref 0.82–1.77)
TSH: 8.58 u[IU]/mL — ABNORMAL HIGH (ref 0.450–4.500)

## 2019-08-19 LAB — FERRITIN: Ferritin: 201 ng/mL — ABNORMAL HIGH (ref 15–150)

## 2019-08-21 ENCOUNTER — Encounter: Payer: Self-pay | Admitting: Family Medicine

## 2019-08-27 ENCOUNTER — Other Ambulatory Visit: Payer: Self-pay | Admitting: Family Medicine

## 2019-08-27 DIAGNOSIS — E039 Hypothyroidism, unspecified: Secondary | ICD-10-CM

## 2019-08-27 NOTE — Telephone Encounter (Signed)
Requested medication (s) are due for refill today: yes  Requested medication (s) are on the active medication list: yes  Last refill:  08/18/2019  #30  1 refill  Future visit scheduled: yes  Notes to clinic: TSH on 08/18/19 was 8.580 .Dose may need adjusted.    Requested Prescriptions  Pending Prescriptions Disp Refills   SYNTHROID 100 MCG tablet [Pharmacy Med Name: SYNTHROID 100 MCG TABLET] 30 tablet 1    Sig: Take 1 tablet (100 mcg total) by mouth daily before breakfast.     Endocrinology:  Hypothyroid Agents Failed - 08/27/2019 10:32 AM      Failed - TSH needs to be rechecked within 3 months after an abnormal result. Refill until TSH is due.      Failed - TSH in normal range and within 360 days    TSH  Date Value Ref Range Status  08/18/2019 8.580 (H) 0.450 - 4.500 uIU/mL Final         Passed - Valid encounter within last 12 months    Recent Outpatient Visits          1 week ago Hyperlipidemia, unspecified hyperlipidemia type   [facility], Ranell Patrick, MD   3 weeks ago Medicare annual wellness visit, subsequent   [facility], Lilia Argue, MD   2 months ago Onychomycosis   [facility], Delfino Lovett, NP   5 months ago Right flank pain   [facility] [place], MD   6 months ago Hypothyroidism, unspecified type   [facility], Ranell Patrick, MD      Future Appointments            In 5 months Carlota Raspberry Ranell Patrick, MD [facility] at [place], [facility]

## 2019-08-31 ENCOUNTER — Encounter: Payer: Self-pay | Admitting: Radiology

## 2019-09-24 ENCOUNTER — Other Ambulatory Visit: Payer: Self-pay

## 2019-09-24 ENCOUNTER — Encounter: Payer: Self-pay | Admitting: Family Medicine

## 2019-09-24 ENCOUNTER — Ambulatory Visit (INDEPENDENT_AMBULATORY_CARE_PROVIDER_SITE_OTHER): Payer: PPO | Admitting: Family Medicine

## 2019-09-24 VITALS — BP 106/61 | HR 70 | Temp 98.7°F | Wt 248.4 lb

## 2019-09-24 DIAGNOSIS — B351 Tinea unguium: Secondary | ICD-10-CM

## 2019-09-24 DIAGNOSIS — E039 Hypothyroidism, unspecified: Secondary | ICD-10-CM | POA: Diagnosis not present

## 2019-09-24 DIAGNOSIS — Z5181 Encounter for therapeutic drug level monitoring: Secondary | ICD-10-CM

## 2019-09-24 NOTE — Progress Notes (Signed)
Subjective:    Patient ID: Veronica Martinez, female    DOB: 1956-04-23, 63 y.o.   MRN: [id number]  HPI Veronica Martinez is a 63 y.o. female Presents today for: Chief Complaint  Patient presents with  . Nail Problem    here for a f/u after taking toe nail mediation for fungus   Onychomycosis: Great toenails.  Started on topical Jublia and Lamisil in June Ran out of pills last month.  Used topical Jublia for a few months. Lamisil daily.  Improving. Still some discomfort on R great toe but improving.   . Lab Results  Component Value Date   ALT 18 08/18/2019   AST 22 08/18/2019   ALKPHOS 84 08/18/2019   BILITOT 0.3 08/18/2019     Hypothyroidism: Lab Results  Component Value Date   TSH 8.580 (H) 08/18/2019  Taking medication daily. 127mcg QD.  Occasional heat intolerance. No new hair or skin changes, heart palpitations or new fatigue. Weight up and down.   Wt Readings from Last 3 Encounters:  09/24/19 248 lb 6.4 oz (112.7 kg)  08/18/19 247 lb 3.2 oz (112.1 kg)  08/03/19 243 lb (110.2 kg)  weight 247 at 02/15/19 visit.    Patient Active Problem List   Diagnosis Date Noted  . 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 Right hip pain 08/09/2013  . Trigger finger, acquired 04/22/2013  . Carpal tunnel syndrome of right wrist 04/22/2013  . Pain of right heel 12/08/2012  . Myalgia 11/10/2012  . OBESITY, UNSPECIFIED 02/20/2011  . BACK PAIN, LUMBAR 07/05/2010  . ABNORMALITY OF GAIT 05/02/2010  . MERALGIA PARESTHETICA 03/02/2010  . HIP PAIN, BILATERAL 03/02/2010   Past Medical History:  Diagnosis Date  . Allergy   . GERD (gastroesophageal reflux disease)   . Headache(784.0)    sinus  . High cholesterol   . History of Graves' disease   . Hx of radioactive iodine thyroid ablation   . Hypothyroidism    Past Surgical History:  Procedure Laterality Date  . ABDOMINAL HYSTERECTOMY    . APPENDECTOMY    .  CATARACT EXTRACTION W/PHACO Right 09/22/2013   Procedure: CATARACT EXTRACTION PHACO AND INTRAOCULAR LENS PLACEMENT (IOC);  Surgeon: [name], MD;  Location: [place];  Service: Ophthalmology;  Laterality: Right;  . CATARACT EXTRACTION W/PHACO Left 12/29/2013   Procedure: CATARACT EXTRACTION PHACO AND INTRAOCULAR LENS PLACEMENT (IOC);  Surgeon: [name], MD;  Location: [place];  Service: Ophthalmology;  Laterality: Left;  . COLONOSCOPY    . EYE SURGERY    . HIP ARTHROPLASTY Bilateral   . JOINT REPLACEMENT    . OTHER SURGICAL HISTORY Right    carpal tunel injection  . SHOULDER SURGERY    . TONSILLECTOMY     Allergies  Allergen Reactions  . Meloxicam Other (See Comments)    Causes Bruising   . Pravastatin Sodium Other (See Comments)    Bruises  . Simvastatin Other (See Comments)    Bruises   . Tramadol Hcl Other (See Comments)    Bruising    Prior to Admission medications   Medication Sig Start Date End Date Taking? Authorizing Provider  acetaminophen (TYLENOL) 650 MG CR tablet Take 650 mg by mouth every 8 (eight) hours as needed for pain.   Yes [provider]  Calcium Carbonate (CALCIUM 600 PO) Take by mouth.   Yes [provider]  Cholecalciferol (VITAMIN D) 50 MCG (2000 UT) tablet Take 2,000  Units by mouth daily.   Yes [provider]  Coenzyme Q10 (CO Q 10 PO) Take 75 mg by mouth 3 (three) times daily.   Yes [provider]  Efinaconazole 10 % SOLN Apply 1 Dose topically daily. 06/14/19  Yes Maximiano Coss, NP  fluticasone [facility]) 50 MCG/ACT nasal spray SPRAY 2 SPRAYS INTO EACH NOSTRIL EVERY DAY 10/15/18  Yes Wendie Agreste, MD  gabapentin (NEURONTIN) 600 MG tablet Take 1 tablet (600 mg total) by mouth 2 (two) times daily. 08/18/19  Yes Wendie Agreste, MD  Guaifenesin [facility] MAXIMUM STRENGTH) 1200 MG TB12 Take 1 tablet (1,200 mg total) by mouth every 12 (twelve) hours as needed. 05/10/16  Yes English, Colletta [place] D, PA  ibuprofen  (ADVIL,MOTRIN) 600 MG tablet Take 1 tablet (600 mg total) by mouth every 8 (eight) hours as needed (with food). 10/21/17  Yes Wendie Agreste, MD  Misc Natural Products (CALCIUM PLUS ADVANCED PO) Take 600 mg by mouth 2 (two) times daily.    Yes [provider]  Multiple Vitamins-Minerals (CENTRUM SILVER PO) Take by mouth.   Yes [provider]  Omega-3 Fatty Acids (FISH OIL) 1200 MG CAPS Take 1,200 mg by mouth 3 (three) times daily.    Yes [provider]  pantoprazole (PROTONIX) 40 MG tablet Take 1 tablet (40 mg total) by mouth daily. 01/16/17  Yes Wendie Agreste, MD  rosuvastatin (CRESTOR) 5 MG tablet TAKE 1 TABLET BY MOUTH DAILY AT 6 PM 08/18/19  Yes Wendie Agreste, MD  SYNTHROID 100 MCG tablet Take 1 tablet (100 mcg total) by mouth daily before breakfast. 08/18/19  Yes Wendie Agreste, MD  terbinafine (LAMISIL) 250 MG tablet Take 1 tablet (250 mg total) by mouth daily. 06/14/19  Yes Maximiano Coss, NP  vitamin E 400 UNIT capsule Take 400 Units by mouth daily.   Yes [provider]   Social History   Socioeconomic History  . Marital status: Married    Spouse name: Not on file  . Number of children: Not on file  . Years of education: Not on file  . Highest education level: Not on file  Occupational History  . Not on file  Social Needs  . Financial resource strain: Not on file  . Food insecurity    Worry: Not on file    Inability: Not on file  . Transportation needs    Medical: Not on file    Non-medical: Not on file  Tobacco Use  . Smoking status: Never Smoker  . Smokeless tobacco: Never Used  Substance and Sexual Activity  . Alcohol use: No  . Drug use: No  . Sexual activity: Yes    Partners: Male  Lifestyle  . Physical activity    Days per week: Not on file    Minutes per session: Not on file  . Stress: Not on file  Relationships  . Social Herbalist on phone: Not on file    Gets together: Not on file    Attends  religious service: Not on file    Active member of club or organization: Not on file    Attends meetings of clubs or organizations: Not on file    Relationship status: Not on file  . Intimate partner violence    Fear of current or ex partner: Not on file    Emotionally abused: Not on file    Physically abused: Not on file    Forced sexual activity: Not on  file  Other Topics Concern  . Not on file  Social History Narrative   Married. Education: [employer].     Review of Systems Per HPI.     Objective:   Physical Exam Vitals signs reviewed.  Constitutional:      Appearance: She is well-developed.  HENT:     Head: Normocephalic and atraumatic.  Eyes:     Conjunctiva/sclera: Conjunctivae normal.     Pupils: Pupils are equal, round, and reactive to light.  Neck:     Thyroid: No thyroid mass, thyromegaly or thyroid tenderness.     Vascular: No carotid bruit.  Cardiovascular:     Rate and Rhythm: Normal rate and regular rhythm.     Heart sounds: Normal heart sounds.  Pulmonary:     Effort: Pulmonary effort is normal.     Breath sounds: Normal breath sounds.  Abdominal:     Palpations: Abdomen is soft. There is no pulsatile mass.     Tenderness: There is no abdominal tenderness.  Musculoskeletal:       Feet:  Skin:    General: Skin is warm and dry.  Neurological:     Mental Status: She is alert and oriented to person, place, and time.  Psychiatric:        Behavior: Behavior normal.    Vitals:   09/24/19 1636  BP: 106/61  Pulse: 70  Temp: 98.7 F (37.1 C)  TempSrc: Oral  SpO2: 98%  Weight: 248 lb 6.4 oz (112.7 kg)        Assessment & Plan:  HYLEE BEBB is a 63 y.o. female Hypothyroidism, unspecified type - Plan: TSH + free T4  -Prior borderline testing, repeat TSH, free T4 to determine changes.  Onychomycosis - Plan: Hepatic Function Panel, Hepatic Function Panel Medication monitoring encounter - Plan: Hepatic Function Panel  -Improving, still some  discoloration distal quarter to third of nails as above.  Extend Lamisil for up to an additional 3 months with repeat liver tests in 6 weeks.  No orders of the defined types were placed in this encounter.  There are no Patient Instructions on file for this visit. Signed,   [name], MD [facility] at [place].  09/25/19 8:36 AM

## 2019-09-25 ENCOUNTER — Encounter: Payer: Self-pay | Admitting: Family Medicine

## 2019-09-25 LAB — TSH+FREE T4
Free T4: 1.59 ng/dL (ref 0.82–1.77)
TSH: 5.32 u[IU]/mL — ABNORMAL HIGH (ref 0.450–4.500)

## 2019-09-25 LAB — HEPATIC FUNCTION PANEL
ALT: 17 IU/L (ref 0–32)
AST: 18 IU/L (ref 0–40)
Albumin: 4.7 g/dL (ref 3.8–4.8)
Alkaline Phosphatase: 86 IU/L (ref 39–117)
Bilirubin Total: 0.3 mg/dL (ref 0.0–1.2)
Bilirubin, Direct: 0.13 mg/dL (ref 0.00–0.40)
Total Protein: 7.6 g/dL (ref 6.0–8.5)

## 2019-09-27 ENCOUNTER — Other Ambulatory Visit: Payer: Self-pay | Admitting: Family Medicine

## 2019-09-27 DIAGNOSIS — B351 Tinea unguium: Secondary | ICD-10-CM

## 2019-09-27 MED ORDER — TERBINAFINE HCL 250 MG PO TABS: 250.0000 mg | ORAL_TABLET | Freq: Every day | ORAL | 0 refills | Status: DC

## 2019-09-27 NOTE — Telephone Encounter (Signed)
terbinafine (LAMISIL) 250 MG tablet  Pt had appt Fri and was to have called this in... [id number] [place], [place] - [address] [phone] (Phone) [phone] (Fax)   Pls refill

## 2019-10-04 ENCOUNTER — Encounter: Payer: Self-pay | Admitting: Radiology

## 2019-10-04 ENCOUNTER — Other Ambulatory Visit: Payer: Self-pay | Admitting: Family Medicine

## 2019-10-18 ENCOUNTER — Ambulatory Visit
Admission: RE | Admit: 2019-10-18 | Discharge: 2019-10-18 | Disposition: A | Discharge status: Home / Self Care | Payer: PPO | Source: Ambulatory Visit | Attending: Family Medicine | Admitting: Family Medicine

## 2019-10-18 ENCOUNTER — Other Ambulatory Visit: Payer: Self-pay

## 2019-10-18 DIAGNOSIS — E2839 Other primary ovarian failure: Secondary | ICD-10-CM

## 2019-10-18 DIAGNOSIS — Z780 Asymptomatic menopausal state: Secondary | ICD-10-CM | POA: Diagnosis not present

## 2019-10-18 DIAGNOSIS — Z13820 Encounter for screening for osteoporosis: Secondary | ICD-10-CM | POA: Diagnosis not present

## 2019-10-26 DIAGNOSIS — H43812 Vitreous degeneration, left eye: Secondary | ICD-10-CM | POA: Diagnosis not present

## 2019-10-26 DIAGNOSIS — H35372 Puckering of macula, left eye: Secondary | ICD-10-CM | POA: Diagnosis not present

## 2019-10-26 DIAGNOSIS — H26491 Other secondary cataract, right eye: Secondary | ICD-10-CM | POA: Diagnosis not present

## 2019-10-26 DIAGNOSIS — H10413 Chronic giant papillary conjunctivitis, bilateral: Secondary | ICD-10-CM | POA: Diagnosis not present

## 2019-10-26 DIAGNOSIS — H0102A Squamous blepharitis right eye, upper and lower eyelids: Secondary | ICD-10-CM | POA: Diagnosis not present

## 2019-10-26 DIAGNOSIS — H0264 Xanthelasma of left upper eyelid: Secondary | ICD-10-CM | POA: Diagnosis not present

## 2019-10-26 DIAGNOSIS — Z961 Presence of intraocular lens: Secondary | ICD-10-CM | POA: Diagnosis not present

## 2019-10-28 ENCOUNTER — Other Ambulatory Visit: Payer: Self-pay | Admitting: Family Medicine

## 2019-11-05 ENCOUNTER — Ambulatory Visit: Payer: PPO

## 2019-11-05 ENCOUNTER — Other Ambulatory Visit: Payer: Self-pay

## 2019-11-05 DIAGNOSIS — B351 Tinea unguium: Secondary | ICD-10-CM | POA: Diagnosis not present

## 2019-11-06 LAB — HEPATIC FUNCTION PANEL
ALT: 18 IU/L (ref 0–32)
AST: 30 IU/L (ref 0–40)
Albumin: 5 g/dL — ABNORMAL HIGH (ref 3.8–4.8)
Alkaline Phosphatase: 85 IU/L (ref 39–117)
Bilirubin Total: 0.2 mg/dL (ref 0.0–1.2)
Bilirubin, Direct: 0.1 mg/dL (ref 0.00–0.40)
Total Protein: 8.1 g/dL (ref 6.0–8.5)

## 2019-11-15 ENCOUNTER — Encounter: Payer: Self-pay | Admitting: Radiology

## 2019-11-23 ENCOUNTER — Other Ambulatory Visit: Payer: Self-pay | Admitting: Family Medicine

## 2019-12-21 ENCOUNTER — Other Ambulatory Visit: Payer: Self-pay | Admitting: Family Medicine

## 2019-12-27 ENCOUNTER — Ambulatory Visit (INDEPENDENT_AMBULATORY_CARE_PROVIDER_SITE_OTHER): Payer: PPO | Admitting: Family Medicine

## 2019-12-27 ENCOUNTER — Encounter: Payer: Self-pay | Admitting: Family Medicine

## 2019-12-27 ENCOUNTER — Other Ambulatory Visit: Payer: Self-pay

## 2019-12-27 VITALS — BP 124/74 | HR 73 | Temp 97.7°F | Ht 66.0 in | Wt 243.0 lb

## 2019-12-27 DIAGNOSIS — R7989 Other specified abnormal findings of blood chemistry: Secondary | ICD-10-CM | POA: Diagnosis not present

## 2019-12-27 DIAGNOSIS — B351 Tinea unguium: Secondary | ICD-10-CM

## 2019-12-27 DIAGNOSIS — E785 Hyperlipidemia, unspecified: Secondary | ICD-10-CM | POA: Diagnosis not present

## 2019-12-27 DIAGNOSIS — E039 Hypothyroidism, unspecified: Secondary | ICD-10-CM | POA: Diagnosis not present

## 2019-12-27 DIAGNOSIS — G5710 Meralgia paresthetica, unspecified lower limb: Secondary | ICD-10-CM

## 2019-12-27 DIAGNOSIS — M792 Neuralgia and neuritis, unspecified: Secondary | ICD-10-CM

## 2019-12-27 MED ORDER — GABAPENTIN 600 MG PO TABS: 600.0000 mg | ORAL_TABLET | Freq: Two times a day (BID) | ORAL | 2 refills | Status: DC

## 2019-12-27 MED ORDER — SYNTHROID 100 MCG PO TABS: 100.0000 ug | ORAL_TABLET | Freq: Every day | ORAL | 2 refills | Status: DC

## 2019-12-27 MED ORDER — ROSUVASTATIN CALCIUM 5 MG PO TABS: ORAL_TABLET | ORAL | 2 refills | Status: DC

## 2019-12-27 NOTE — Progress Notes (Signed)
Subjective:  Patient ID: Veronica Martinez, female    DOB: [birth year]  Age: 64 y.o. MRN: [id number]  CC:  Chief Complaint  Patient presents with  . Follow-up    recheck on blood work. no change in general health since last visit.    HPI Veronica Martinez presents for   Hypothyroidism: Lab Results  Component Value Date   TSH 4.260 12/27/2019  Previously borderline TSH, and again borderline in October but closer to normal.  T4 okay, decided to remain on same dose of Synthroid 100 mcg daily. Taking medication daily. No missed doses.  No new hot or cold intolerance. No new hair or skin changes, heart palpitations or new fatigue. No new weight changes.    Onychomycosis: Extended Lamisil for additional 3 months in October, LFTs normal in November. Toes look better - back to normal - 2 more pills.  Lab Results  Component Value Date   ALT 17 12/27/2019   AST 20 12/27/2019   ALKPHOS 85 12/27/2019   BILITOT 0.3 12/27/2019   Hyperlipidemia: Crestor 5 mg daily. No new myalgias/side effects. Last at 5 hrs ago.  Lab Results  Component Value Date   CHOL 182 12/27/2019   HDL 108 12/27/2019   LDLCALC 62 12/27/2019   TRIG 60 12/27/2019   CHOLHDL 1.7 12/27/2019   Lab Results  Component Value Date   ALT 17 12/27/2019   AST 20 12/27/2019   ALKPHOS 85 12/27/2019   BILITOT 0.3 12/27/2019    Elevated ferritin: Reading of 150 in August 2019, 184 in February 2020, 201 August 2020.  On chart review she did have a reading of 208 in February 2019. Not taking iron supplements, but does take MVI with iron?   Meralgia paresthetica.  R leg.  Stable with gabapentin BID. No new new side effects.    History Patient Active Problem List   Diagnosis Date Noted  . 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 Right hip pain 08/09/2013  . Trigger finger, acquired 04/22/2013  . Carpal tunnel syndrome of right wrist 04/22/2013  .  Pain of right heel 12/08/2012  . Myalgia 11/10/2012  . OBESITY, UNSPECIFIED 02/20/2011  . BACK PAIN, LUMBAR 07/05/2010  . ABNORMALITY OF GAIT 05/02/2010  . MERALGIA PARESTHETICA 03/02/2010  . HIP PAIN, BILATERAL 03/02/2010   Past Medical History:  Diagnosis Date  . Allergy   . GERD (gastroesophageal reflux disease)   . Headache(784.0)    sinus  . High cholesterol   . History of Graves' disease   . Hx of radioactive iodine thyroid ablation   . Hypothyroidism    Past Surgical History:  Procedure Laterality Date  . ABDOMINAL HYSTERECTOMY    . APPENDECTOMY    . CATARACT EXTRACTION W/PHACO Right 09/22/2013   Procedure: CATARACT EXTRACTION PHACO AND INTRAOCULAR LENS PLACEMENT (IOC);  Surgeon: [name], MD;  Location: [facility];  Service: Ophthalmology;  Laterality: Right;  . CATARACT EXTRACTION W/PHACO Left 12/29/2013   Procedure: CATARACT EXTRACTION PHACO AND INTRAOCULAR LENS PLACEMENT (IOC);  Surgeon: [name], MD;  Location: [facility];  Service: Ophthalmology;  Laterality: Left;  . COLONOSCOPY    . EYE SURGERY    . HIP ARTHROPLASTY Bilateral   . JOINT REPLACEMENT    . OTHER SURGICAL HISTORY Right    carpal tunel injection  . SHOULDER SURGERY    . TONSILLECTOMY     Allergies  Allergen Reactions  . Meloxicam Other (See Comments)  Causes Bruising   . Pravastatin Sodium Other (See Comments)    Bruises  . Simvastatin Other (See Comments)    Bruises   . Tramadol Hcl Other (See Comments)    Bruising    Prior to Admission medications   Medication Sig Start Date End Date Taking? Authorizing Provider  acetaminophen (TYLENOL) 650 MG CR tablet Take 650 mg by mouth every 8 (eight) hours as needed for pain.   Yes [provider]  Calcium Carbonate (CALCIUM 600 PO) Take by mouth.   Yes [provider]  Cholecalciferol (VITAMIN D) 50 MCG (2000 UT) tablet Take 2,000 Units by mouth daily.   Yes [provider]  Coenzyme Q10 (CO Q 10 PO) Take 75 mg by mouth  3 (three) times daily.   Yes [provider]  Efinaconazole 10 % SOLN Apply 1 Dose topically daily. 06/14/19  Yes Maximiano Coss, NP  fluticasone [facility]) 50 MCG/ACT nasal spray SPRAY 2 SPRAYS INTO EACH NOSTRIL EVERY DAY 12/21/19  Yes Wendie Agreste, MD  gabapentin (NEURONTIN) 600 MG tablet Take 1 tablet (600 mg total) by mouth 2 (two) times daily. 08/18/19  Yes Wendie Agreste, MD  Guaifenesin [facility] MAXIMUM STRENGTH) 1200 MG TB12 Take 1 tablet (1,200 mg total) by mouth every 12 (twelve) hours as needed. 05/10/16  Yes English, Colletta Maryland D, PA  ibuprofen (ADVIL,MOTRIN) 600 MG tablet Take 1 tablet (600 mg total) by mouth every 8 (eight) hours as needed (with food). 10/21/17  Yes Wendie Agreste, MD  Misc Natural Products (CALCIUM PLUS ADVANCED PO) Take 600 mg by mouth 2 (two) times daily.    Yes [provider]  Multiple Vitamins-Minerals (CENTRUM SILVER PO) Take by mouth.   Yes [provider]  Omega-3 Fatty Acids (FISH OIL) 1200 MG CAPS Take 1,200 mg by mouth 3 (three) times daily.    Yes [provider]  pantoprazole (PROTONIX) 40 MG tablet Take 1 tablet (40 mg total) by mouth daily. 01/16/17  Yes Wendie Agreste, MD  rosuvastatin (CRESTOR) 5 MG tablet TAKE 1 TABLET BY MOUTH DAILY AT 6 PM 08/18/19  Yes Wendie Agreste, MD  SYNTHROID 100 MCG tablet Take 1 tablet (100 mcg total) by mouth daily before breakfast. 08/18/19  Yes Wendie Agreste, MD  terbinafine (LAMISIL) 250 MG tablet Take 1 tablet (250 mg total) by mouth daily. 09/27/19  Yes Wendie Agreste, MD  vitamin E 400 UNIT capsule Take 400 Units by mouth daily.   Yes [provider]  Cyanocobalamin 1000 MCG CAPS cyanocobalamin (vitamin B-12) 1,000 mcg capsule  2000 mcg po    [provider]  [facility] injection  08/18/19   [provider]   Social History   Socioeconomic History  . Marital status: Married    Spouse name: Not on file  . Number of children: Not on file   . Years of education: Not on file  . Highest education level: Not on file  Occupational History  . Not on file  Tobacco Use  . Smoking status: Never Smoker  . Smokeless tobacco: Never Used  Substance and Sexual Activity  . Alcohol use: No  . Drug use: No  . Sexual activity: Yes    Partners: Male  Other Topics Concern  . Not on file  Social History Narrative   Married. Education: [employer].    Social Determinants of Health   Financial Resource Strain:   . Difficulty of Paying Living Expenses: Not on file  Food  Insecurity:   . Worried About Charity fundraiser in the Last Year: Not on file  . Ran Out of Food in the Last Year: Not on file  Transportation Needs:   . Lack of Transportation (Medical): Not on file  . Lack of Transportation (Non-Medical): Not on file  Physical Activity:   . Days of Exercise per Week: Not on file  . Minutes of Exercise per Session: Not on file  Stress:   . Feeling of Stress : Not on file  Social Connections:   . Frequency of Communication with Friends and Family: Not on file  . Frequency of Social Gatherings with Friends and Family: Not on file  . Attends Religious Services: Not on file  . Active Member of Clubs or Organizations: Not on file  . Attends Archivist Meetings: Not on file  . Marital Status: Not on file  Intimate Partner Violence:   . Fear of Current or Ex-Partner: Not on file  . Emotionally Abused: Not on file  . Physically Abused: Not on file  . Sexually Abused: Not on file    Review of Systems  Per [facility]    Objective:   Vitals:   12/27/19 1421  BP: 124/74  Pulse: 73  Temp: 97.7 F (36.5 C)  TempSrc: Temporal  SpO2: 96%  Weight: 243 lb (110.2 kg)  Height: 5\' 6"  (1.676 m)     Physical Exam Vitals reviewed.  Constitutional:      Appearance: She is well-developed.  HENT:     Head: Normocephalic and atraumatic.  Eyes:     Conjunctiva/sclera: Conjunctivae normal.     Pupils: Pupils are equal,  round, and reactive to light.  Neck:     Vascular: No carotid bruit.  Cardiovascular:     Rate and Rhythm: Normal rate and regular rhythm.     Heart sounds: Normal heart sounds.  Pulmonary:     Effort: Pulmonary effort is normal.     Breath sounds: Normal breath sounds.  Abdominal:     Palpations: Abdomen is soft. There is no pulsatile mass.     Tenderness: There is no abdominal tenderness.  Musculoskeletal:     Cervical back: Neck supple. No tenderness.  Skin:    General: Skin is warm and dry.     Comments: Toes: nail polish in place, no proximal discoloration/thickening appreciated.   Neurological:     Mental Status: She is alert and oriented to person, place, and time.  Psychiatric:        Behavior: Behavior normal.        Assessment & Plan:  Veronica Martinez is a 64 y.o. female . Hypothyroidism, unspecified type - Plan: TSH + free T4, SYNTHROID 100 MCG tablet  -Borderline TSH prior, asymptomatic at current dose.  Repeat testing with  free T4, no doses changes at this time  Hyperlipidemia, unspecified hyperlipidemia type - Plan: rosuvastatin (CRESTOR) 5 MG tablet, Lipid panel, Comprehensive metabolic panel  -Tolerating Crestor, continue same, labs pending  Onychomycosis  -Symptomatically improved.  Finish Lamisil.  RTC precautions if recurrence.  Elevated ferritin - Plan: Ferritin  -Repeat testing, will check on supplement. Further workup tbd on labs, lft's.   Meralgia paresthetica, unspecified laterality Nerve pain - Plan: gabapentin (NEURONTIN) 600 MG tablet  -Stable, continue gabapentin same dose.  Meds ordered this encounter  Medications  . rosuvastatin (CRESTOR) 5 MG tablet    Sig: TAKE 1 TABLET BY MOUTH DAILY AT 6 PM    Dispense:  90 tablet    Refill:  2  . gabapentin (NEURONTIN) 600 MG tablet    Sig: Take 1 tablet (600 mg total) by mouth 2 (two) times daily.    Dispense:  180 tablet    Refill:  2    Please consider 90 day supplies to promote  better adherence  . SYNTHROID 100 MCG tablet    Sig: Take 1 tablet (100 mcg total) by mouth daily before breakfast.    Dispense:  90 tablet    Refill:  2   Patient Instructions    If toenails start to discolor again - follow up to discuss other options.   Let me know how much iron is in your multivitamin. I will check that level again today.   No other medication changes today.  I will let you know if there are any concerns on your labs.  Follow-up in 6 months and thank you for coming in today.     If you have lab work done today you will be contacted with your lab results within the next 2 weeks.  If you have not heard from [place] then please contact us. The fastest way to get your results is to register for My Chart.   IF you received an x-ray today, you will receive an invoice from [facility]. Please contact [facility] at [phone] with questions or concerns regarding your invoice.   IF you received labwork today, you will receive an invoice from [place]. Please contact [facility] at [phone] with questions or concerns regarding your invoice.   Our billing staff will not be able to assist you with questions regarding bills from these companies.  You will be contacted with the lab results as soon as they are available. The fastest way to get your results is to activate your My Chart account. Instructions are located on the last page of this paperwork. If you have not heard from [place] regarding the results in 2 weeks, please contact this office.         Signed, [name], MD [facility]

## 2019-12-27 NOTE — Patient Instructions (Addendum)
  If toenails start to discolor again - follow up to discuss other options.   Let me know how much iron is in your multivitamin. I will check that level again today.   No other medication changes today.  I will let you know if there are any concerns on your labs.  Follow-up in 6 months and thank you for coming in today.     If you have lab work done today you will be contacted with your lab results within the next 2 weeks.  If you have not heard from [place] then please contact us. The fastest way to get your results is to register for My Chart.   IF you received an x-ray today, you will receive an invoice from [facility]. Please contact [facility] at [phone] with questions or concerns regarding your invoice.   IF you received labwork today, you will receive an invoice from [place]. Please contact [facility] at [phone] with questions or concerns regarding your invoice.   Our billing staff will not be able to assist you with questions regarding bills from these companies.  You will be contacted with the lab results as soon as they are available. The fastest way to get your results is to activate your My Chart account. Instructions are located on the last page of this paperwork. If you have not heard from [place] regarding the results in 2 weeks, please contact this office.

## 2019-12-28 ENCOUNTER — Encounter: Payer: Self-pay | Admitting: Family Medicine

## 2019-12-28 ENCOUNTER — Ambulatory Visit: Payer: PPO | Admitting: Family Medicine

## 2019-12-28 LAB — COMPREHENSIVE METABOLIC PANEL
ALT: 17 IU/L (ref 0–32)
AST: 20 IU/L (ref 0–40)
Albumin/Globulin Ratio: 1.7 (ref 1.2–2.2)
Albumin: 4.7 g/dL (ref 3.8–4.8)
Alkaline Phosphatase: 85 IU/L (ref 39–117)
BUN/Creatinine Ratio: 11 — ABNORMAL LOW (ref 12–28)
BUN: 9 mg/dL (ref 8–27)
Bilirubin Total: 0.3 mg/dL (ref 0.0–1.2)
CO2: 24 mmol/L (ref 20–29)
Calcium: 10 mg/dL (ref 8.7–10.3)
Chloride: 104 mmol/L (ref 96–106)
Creatinine, Ser: 0.8 mg/dL (ref 0.57–1.00)
GFR calc Af Amer: 89 mL/min/{1.73_m2} (ref >59)
GFR calc non Af Amer: 78 mL/min/{1.73_m2} (ref >59)
Globulin, Total: 2.7 g/dL (ref 1.5–4.5)
Glucose: 100 mg/dL — ABNORMAL HIGH (ref 65–99)
Potassium: 4.2 mmol/L (ref 3.5–5.2)
Sodium: 142 mmol/L (ref 134–144)
Total Protein: 7.4 g/dL (ref 6.0–8.5)

## 2019-12-28 LAB — LIPID PANEL
Chol/HDL Ratio: 1.7 ratio (ref 0.0–4.4)
Cholesterol, Total: 182 mg/dL (ref 100–199)
HDL: 108 mg/dL (ref >39)
LDL Chol Calc (NIH): 62 mg/dL (ref 0–99)
Triglycerides: 60 mg/dL (ref 0–149)
VLDL Cholesterol Cal: 12 mg/dL (ref 5–40)

## 2019-12-28 LAB — TSH+FREE T4
Free T4: 1.45 ng/dL (ref 0.82–1.77)
TSH: 4.26 u[IU]/mL (ref 0.450–4.500)

## 2019-12-28 LAB — FERRITIN: Ferritin: 200 ng/mL — ABNORMAL HIGH (ref 15–150)

## 2019-12-30 ENCOUNTER — Other Ambulatory Visit: Payer: Self-pay | Admitting: Family Medicine

## 2019-12-30 DIAGNOSIS — E785 Hyperlipidemia, unspecified: Secondary | ICD-10-CM

## 2020-01-17 ENCOUNTER — Other Ambulatory Visit: Payer: Self-pay | Admitting: Family Medicine

## 2020-01-17 NOTE — Telephone Encounter (Signed)
Requested Prescriptions  Pending Prescriptions Disp Refills  . fluticasone (FLONASE) 50 MCG/ACT nasal spray [Pharmacy Med Name: FLUTICASONE PROP 50 MCG SPRAY] 16 mL 0    Sig: SPRAY 2 SPRAYS INTO EACH NOSTRIL EVERY DAY     Ear, Nose, and Throat: Nasal Preparations - Corticosteroids Passed - 01/17/2020 10:31 AM      Passed - Valid encounter within last 12 months    Recent Outpatient Visits          3 weeks ago Hypothyroidism, unspecified type   [facility], Ranell Patrick, MD   3 months ago Hypothyroidism, unspecified type   [facility], Ranell Patrick, MD   5 months ago Hyperlipidemia, unspecified hyperlipidemia type   [facility], Ranell Patrick, MD   5 months ago Medicare annual wellness visit, subsequent   [facility], Lilia Argue, MD   7 months ago Onychomycosis   [facility], Delfino Lovett, NP      Future Appointments            In 5 months Carlota Raspberry, Ranell Patrick, MD [facility] [place], [facility]

## 2020-02-08 ENCOUNTER — Other Ambulatory Visit: Payer: Self-pay | Admitting: Family Medicine

## 2020-02-08 DIAGNOSIS — R7989 Other specified abnormal findings of blood chemistry: Secondary | ICD-10-CM

## 2020-02-08 NOTE — Progress Notes (Signed)
See labs 

## 2020-02-09 ENCOUNTER — Ambulatory Visit: Payer: PPO

## 2020-02-09 ENCOUNTER — Other Ambulatory Visit: Payer: Self-pay

## 2020-02-09 DIAGNOSIS — R7989 Other specified abnormal findings of blood chemistry: Secondary | ICD-10-CM

## 2020-02-10 ENCOUNTER — Ambulatory Visit: Payer: PPO

## 2020-02-10 ENCOUNTER — Other Ambulatory Visit: Payer: Self-pay

## 2020-02-11 LAB — CBC WITH DIFFERENTIAL/PLATELET
Basophils Absolute: 0 10*3/uL (ref 0.0–0.2)
Basos: 0 %
EOS (ABSOLUTE): 0 10*3/uL (ref 0.0–0.4)
Eos: 1 %
Hematocrit: 40.8 % (ref 34.0–46.6)
Hemoglobin: 13.8 g/dL (ref 11.1–15.9)
Immature Grans (Abs): 0 10*3/uL (ref 0.0–0.1)
Immature Granulocytes: 0 %
Lymphocytes Absolute: 2.3 10*3/uL (ref 0.7–3.1)
Lymphs: 51 %
MCH: 32.5 pg (ref 26.6–33.0)
MCHC: 33.8 g/dL (ref 31.5–35.7)
MCV: 96 fL (ref 79–97)
Monocytes Absolute: 0.6 10*3/uL (ref 0.1–0.9)
Monocytes: 12 %
Neutrophils Absolute: 1.7 10*3/uL (ref 1.4–7.0)
Neutrophils: 36 %
Platelets: 187 10*3/uL (ref 150–450)
RBC: 4.25 x10E6/uL (ref 3.77–5.28)
RDW: 12.8 % (ref 11.7–15.4)
WBC: 4.6 10*3/uL (ref 3.4–10.8)

## 2020-02-11 LAB — TRANSFERRIN SATURATION
IRON SATN MFR SERPL: 25 % Saturation
IRON SERPL-MCNC: 80 ug/dL
TRANSFERRIN SERPL-MCNC: 230 mg/dL

## 2020-02-12 ENCOUNTER — Other Ambulatory Visit: Payer: Self-pay | Admitting: Family Medicine

## 2020-02-12 NOTE — Telephone Encounter (Signed)
Requested Prescriptions  Pending Prescriptions Disp Refills  . fluticasone (FLONASE) 50 MCG/ACT nasal spray [Pharmacy Med Name: FLUTICASONE PROP 50 MCG SPRAY] 16 mL 0    Sig: SPRAY 2 SPRAYS INTO EACH NOSTRIL EVERY DAY     Ear, Nose, and Throat: Nasal Preparations - Corticosteroids Passed - 02/12/2020  9:30 AM      Passed - Valid encounter within last 12 months    Recent Outpatient Visits          1 month ago Hypothyroidism, unspecified type   [facility], Ranell Patrick, MD   4 months ago Hypothyroidism, unspecified type   [facility], Ranell Patrick, MD   5 months ago Hyperlipidemia, unspecified hyperlipidemia type   [facility], Ranell Patrick, MD   6 months ago Medicare annual wellness visit, subsequent   [facility], Lilia Argue, MD   8 months ago Onychomycosis   [facility], Delfino Lovett, NP      Future Appointments            In 4 months Carlota Raspberry, Ranell Patrick, MD [facility] at [place], [facility]

## 2020-02-17 ENCOUNTER — Ambulatory Visit: Payer: PPO | Admitting: Family Medicine

## 2020-02-29 ENCOUNTER — Other Ambulatory Visit: Payer: Self-pay | Admitting: Family Medicine

## 2020-02-29 DIAGNOSIS — R7989 Other specified abnormal findings of blood chemistry: Secondary | ICD-10-CM

## 2020-02-29 NOTE — Progress Notes (Signed)
See lab notes

## 2020-03-02 ENCOUNTER — Telehealth: Payer: Self-pay | Admitting: Hematology

## 2020-03-02 NOTE — Telephone Encounter (Signed)
Scheduled per referral. Called and spoke with pt, confirmed 3/30 appt. Pt made aware to arrive 15-24mins early and to bring insurance card with photo ID

## 2020-03-08 ENCOUNTER — Other Ambulatory Visit: Payer: Self-pay | Admitting: Family Medicine

## 2020-03-21 ENCOUNTER — Other Ambulatory Visit: Payer: Self-pay

## 2020-03-21 ENCOUNTER — Inpatient Hospital Stay: Payer: PPO

## 2020-03-21 ENCOUNTER — Inpatient Hospital Stay: Payer: PPO | Attending: Hematology | Admitting: Hematology

## 2020-03-21 VITALS — BP 162/74 | HR 88 | Temp 98.2°F | Resp 18 | Ht 66.0 in | Wt 246.0 lb

## 2020-03-21 DIAGNOSIS — E039 Hypothyroidism, unspecified: Secondary | ICD-10-CM | POA: Diagnosis not present

## 2020-03-21 DIAGNOSIS — Z96643 Presence of artificial hip joint, bilateral: Secondary | ICD-10-CM | POA: Insufficient documentation

## 2020-03-21 DIAGNOSIS — Z90710 Acquired absence of both cervix and uterus: Secondary | ICD-10-CM | POA: Insufficient documentation

## 2020-03-21 DIAGNOSIS — R7989 Other specified abnormal findings of blood chemistry: Secondary | ICD-10-CM | POA: Insufficient documentation

## 2020-03-21 DIAGNOSIS — E0500 Thyrotoxicosis with diffuse goiter without thyrotoxic crisis or storm: Secondary | ICD-10-CM | POA: Insufficient documentation

## 2020-03-21 LAB — CMP (CANCER CENTER ONLY)
ALT: 19 U/L (ref 0–44)
AST: 18 U/L (ref 15–41)
Albumin: 4.3 g/dL (ref 3.5–5.0)
Alkaline Phosphatase: 74 U/L (ref 38–126)
Anion gap: 10 (ref 5–15)
BUN: 8 mg/dL (ref 8–23)
CO2: 29 mmol/L (ref 22–32)
Calcium: 9.9 mg/dL (ref 8.9–10.3)
Chloride: 104 mmol/L (ref 98–111)
Creatinine: 0.81 mg/dL (ref 0.44–1.00)
GFR, Est AFR Am: >60 mL/min (ref >60)
GFR, Estimated: >60 mL/min (ref >60)
Glucose, Bld: 87 mg/dL (ref 70–99)
Potassium: 4.3 mmol/L (ref 3.5–5.1)
Sodium: 143 mmol/L (ref 135–145)
Total Bilirubin: 0.4 mg/dL (ref 0.3–1.2)
Total Protein: 7.8 g/dL (ref 6.5–8.1)

## 2020-03-21 LAB — IRON AND TIBC
Iron: 78 ug/dL (ref 41–142)
Saturation Ratios: 27 % (ref 21–57)
TIBC: 290 ug/dL (ref 236–444)
UIBC: 212 ug/dL (ref 120–384)

## 2020-03-21 LAB — SEDIMENTATION RATE: Sed Rate: 6 mm/hr (ref 0–22)

## 2020-03-21 LAB — LACTATE DEHYDROGENASE: LDH: 173 U/L (ref 98–192)

## 2020-03-21 LAB — FERRITIN: Ferritin: 133 ng/mL (ref 11–307)

## 2020-03-21 NOTE — Progress Notes (Signed)
HEMATOLOGY/ONCOLOGY CONSULTATION NOTE  Date of Service: 03/21/2020  Patient Care Team: Wendie Agreste, MD as PCP - General (Family Medicine) Warden Fillers, MD as Consulting Physician (Ophthalmology) Ronald Lobo, MD as Consulting Physician (Gastroenterology)  CHIEF COMPLAINTS/PURPOSE OF CONSULTATION:  Elevated Ferritin  HISTORY OF PRESENTING ILLNESS:   Veronica Martinez is a wonderful 64 y.o. female who has been referred to [place] by Dr Carlota Raspberry for evaluation and management of elevated ferritin. Pt is accompanied today by her husband, Mr. Mikels. The pt reports that she is doing well overall.   The pt reports that she was given Lamisil to take to help remove fungus on her toes. Dr. Carlota Raspberry, her PCP, was concerned that this could have been harming her liver, which is why her iron levels were checked. Pt has not been on any PO Iron and is not taking a multivitamin or any herbal supplements at this time. Pt had two blood transfusions with her hip replacement in 2004. Pt began using a cast iron skillet about 6 months ago but does not cook all of her meals in it. She also eats liver about once per week. She does not drink any alcohol at this time. She has no known family history of Hemachromotosis. Pt has no history of liver disease or fatty liver to her knowledge.   Pt had Graves' disease and was given radioactive iodine twice, the last time was in 2005.  She currently has hypothyroidism and takes Synthroid.   Most recent lab results (02/09/2020) of CBC w/diff is as follows: all values are WNL. 02/09/2020 Transferrin Saturation is as follows: Iron at 80, Sat Ratio at 25, Transferrin at 230. 12/27/2019 Ferritin at 200  On review of systems, pt denies abdominal pain and any other symptoms.   On PMHx the pt reports Graves' disease, Radioactive Iodine Thyroid Ablation, Hypothyroidism, Abdominal Hysterectomy, Hip Arthroplasty. On Social Hx the pt reports she does not drink any  alcohol.   MEDICAL HISTORY:  Past Medical History:  Diagnosis Date  . Allergy   . GERD (gastroesophageal reflux disease)   . Headache(784.0)    sinus  . High cholesterol   . History of Graves' disease   . Hx of radioactive iodine thyroid ablation   . Hypothyroidism     SURGICAL HISTORY: Past Surgical History:  Procedure Laterality Date  . ABDOMINAL HYSTERECTOMY    . APPENDECTOMY    . CATARACT EXTRACTION W/PHACO Right 09/22/2013   Procedure: CATARACT EXTRACTION PHACO AND INTRAOCULAR LENS PLACEMENT (IOC);  Surgeon: [name], MD;  Location: [facility];  Service: Ophthalmology;  Laterality: Right;  . CATARACT EXTRACTION W/PHACO Left 12/29/2013   Procedure: CATARACT EXTRACTION PHACO AND INTRAOCULAR LENS PLACEMENT (IOC);  Surgeon: [name], MD;  Location: [facility];  Service: Ophthalmology;  Laterality: Left;  . COLONOSCOPY    . EYE SURGERY    . HIP ARTHROPLASTY Bilateral   . JOINT REPLACEMENT    . OTHER SURGICAL HISTORY Right    carpal tunel injection  . SHOULDER SURGERY    . TONSILLECTOMY      SOCIAL HISTORY: Social History   Socioeconomic History  . Marital status: Married    Spouse name: Not on file  . Number of children: Not on file  . Years of education: Not on file  . Highest education level: Not on file  Occupational History  . Not on file  Tobacco Use  . Smoking status: Never Smoker  . Smokeless tobacco: Never Used  Substance and Sexual Activity  .  Alcohol use: No  . Drug use: No  . Sexual activity: Yes    Partners: Male  Other Topics Concern  . Not on file  Social History Narrative   Married. Education: [employer].    Social Determinants of Health   Financial Resource Strain:   . Difficulty of Paying Living Expenses:   Food Insecurity:   . Worried About Charity fundraiser in the Last Year:   . Arboriculturist in the Last Year:   Transportation Needs:   . Film/video editor (Medical):   [name] Lack of Transportation (Non-Medical):   Physical  Activity:   . Days of Exercise per Week:   . Minutes of Exercise per Session:   Stress:   . Feeling of Stress :   Social Connections:   . Frequency of Communication with Friends and Family:   . Frequency of Social Gatherings with Friends and Family:   . Attends Religious Services:   . Active Member of Clubs or Organizations:   . Attends Archivist Meetings:   [name] Marital Status:   Intimate Partner Violence:   . Fear of Current or Ex-Partner:   . Emotionally Abused:   [name] Physically Abused:   . Sexually Abused:     FAMILY HISTORY: Family History  Problem Relation Age of Onset  . Hypertension Mother   . Hyperlipidemia Mother   . Diabetes Sister   . Obesity Brother        gastric bypass 2018  . Thyroid disease Sister   . Obesity Sister     ALLERGIES:  is allergic to meloxicam; pravastatin sodium; simvastatin; and tramadol hcl.  MEDICATIONS:  Current Outpatient Medications  Medication Sig Dispense Refill  . acetaminophen (TYLENOL) 650 MG CR tablet Take 650 mg by mouth every 8 (eight) hours as needed for pain.    . Calcium Carbonate (CALCIUM 600 PO) Take by mouth.    . Cholecalciferol (VITAMIN D) 50 MCG (2000 UT) tablet Take 2,000 Units by mouth daily.    . Coenzyme Q10 (CO Q 10 PO) Take 75 mg by mouth 3 (three) times daily.    . fluticasone (FLONASE) 50 MCG/ACT nasal spray SPRAY 2 SPRAYS INTO EACH NOSTRIL EVERY DAY 16 mL 0  . gabapentin (NEURONTIN) 600 MG tablet Take 1 tablet (600 mg total) by mouth 2 (two) times daily. 180 tablet 2  . ibuprofen (ADVIL,MOTRIN) 600 MG tablet Take 1 tablet (600 mg total) by mouth every 8 (eight) hours as needed (with food). 30 tablet 0  . Misc Natural Products (CALCIUM PLUS ADVANCED PO) Take 600 mg by mouth 2 (two) times daily.     . Multiple Vitamins-Minerals (CENTRUM SILVER PO) Take by mouth.    . Omega-3 Fatty Acids (FISH OIL) 1200 MG CAPS Take 1,200 mg by mouth 3 (three) times daily.     . pantoprazole (PROTONIX) 40 MG tablet Take 1  tablet (40 mg total) by mouth daily. 30 tablet 3  . rosuvastatin (CRESTOR) 5 MG tablet TAKE 1 TABLET BY MOUTH DAILY AT 6 PM 90 tablet 2  . SYNTHROID 100 MCG tablet Take 1 tablet (100 mcg total) by mouth daily before breakfast. 90 tablet 2  . vitamin E 400 UNIT capsule Take 400 Units by mouth daily.    [name] SHINGRIX injection      No current facility-administered medications for this visit.    REVIEW OF SYSTEMS:    10 Point review of Systems was done is negative except as noted  above.  PHYSICAL EXAMINATION: ECOG PERFORMANCE STATUS: 0 - Asymptomatic  . Vitals:   03/21/20 1317  BP: (!) 162/74  Pulse: 88  Resp: 18  Temp: 98.2 F (36.8 C)  SpO2: 98%   Filed Weights   03/21/20 1317  Weight: 246 lb (111.6 kg)   .Body mass index is 39.71 kg/m.  GENERAL:alert, in no acute distress and comfortable SKIN: no acute rashes, no significant lesions EYES: conjunctiva are pink and non-injected, sclera anicteric OROPHARYNX: MMM, no exudates, no oropharyngeal erythema or ulceration NECK: supple, no JVD LYMPH:  no palpable lymphadenopathy in the cervical, axillary or inguinal regions LUNGS: clear to auscultation b/l with normal respiratory effort HEART: regular rate & rhythm ABDOMEN:  normoactive bowel sounds , non tender, not distended. Extremity: no pedal edema PSYCH: alert & oriented x 3 with fluent speech NEURO: no focal motor/sensory deficits  LABORATORY DATA:  I have reviewed the data as listed  . CBC Latest Ref Rng & Units 02/09/2020 05/10/2017 06/27/2016  WBC 3.4 - 10.8 x10E3/uL 4.6 5.7 4.0  Hemoglobin 11.1 - 15.9 g/dL 13.8 13.9 13.9  Hematocrit 34.0 - 46.6 % 40.8 41.2 42.1  Platelets 150 - 450 x10E3/uL 187 219 200    . CMP Latest Ref Rng & Units 03/21/2020 12/27/2019 11/05/2019  Glucose 70 - 99 mg/dL 87 100(H) -  BUN 8 - 23 mg/dL 8 9 -  Creatinine 0.44 - 1.00 mg/dL 0.81 0.80 -  Sodium 135 - 145 mmol/L 143 142 -  Potassium 3.5 - 5.1 mmol/L 4.3 4.2 -  Chloride 98 - 111 mmol/L  104 104 -  CO2 22 - 32 mmol/L 29 24 -  Calcium 8.9 - 10.3 mg/dL 9.9 10.0 -  Total Protein 6.5 - 8.1 g/dL 7.8 7.4 8.1  Total Bilirubin 0.3 - 1.2 mg/dL 0.4 0.3 0.2  Alkaline Phos 38 - 126 U/L 74 85 85  AST 15 - 41 U/L 18 20 30   ALT 0 - 44 U/L 19 17 18    . Lab Results  Component Value Date   IRON 78 03/21/2020   TIBC 290 03/21/2020   IRONPCTSAT 27 03/21/2020   (Iron and TIBC)  Lab Results  Component Value Date   FERRITIN 133 03/21/2020   . Lab Results  Component Value Date   LDH 173 03/21/2020     RADIOGRAPHIC STUDIES: I have personally reviewed the radiological images as listed and agreed with the findings in the report. No results found.  ASSESSMENT & PLAN:   64 yo with   1) Mildly elevated ferritin levels r/o hemochromatosis PLAN: -Discussed patient's most recent labs from 02/09/2020, all values are WNL. -Discussed 02/09/2020 Transferrin Saturation is as follows: Iron at 80, Sat Ratio at 25, Transferrin at 230. -Discussed 12/27/2019 Ferritin at 200 -Advised pt that her Ferritin is considered the upper limits of normal, but has not been trending upward. -Advised pt that Ferritin >1000 can cause damage to liver, heart, or other organs. Ferritin at 200 does not carry the same concerns.  -Advised pt that PO Iron, multiple blood transfusions, hemachromatosis, or high iron diets could result in high levels of iron  -Advised pt that inflammatory conditions can cause Ferritin levels to be read as falsely high -Based on lab results and clinical exam, pt's high-normal Ferritin is likely due to a high iron diet -Recommend pt take a multivitamin w/o iron if she would like to take a multivitamin  -Advised pt that blood donation will lower her iron levels  -Will get labs today  to r/o hemachromatosis  -Will see back as needed     FOLLOW UP: Labs today RTC with Dr Irene Limbo based on labs  . Orders Placed This Encounter  Procedures  . CBC with Differential/Platelet    Standing  Status:   Future    Standing Expiration Date:   04/25/2021  . CMP ([facility] only)    Standing Status:   Future    Number of Occurrences:   1    Standing Expiration Date:   03/21/2021  . Lactate dehydrogenase    Standing Status:   Future    Number of Occurrences:   1    Standing Expiration Date:   03/21/2021  . Ferritin    Standing Status:   Future    Number of Occurrences:   1    Standing Expiration Date:   03/21/2021  . Iron and TIBC    Standing Status:   Future    Number of Occurrences:   1    Standing Expiration Date:   03/21/2021  . Hemochromatosis DNA, PCR    Standing Status:   Future    Number of Occurrences:   1    Standing Expiration Date:   03/21/2021  . Sedimentation rate    Standing Status:   Future    Number of Occurrences:   1    Standing Expiration Date:   03/21/2021     All of the patients questions were answered with apparent satisfaction. The patient knows to call the clinic with any problems, questions or concerns.  I spent 30 mins counseling the patient face to face. The total time spent in the appointment was 35 minutes and more than 50% was on counseling and direct patient cares.    Sullivan Lone MD [facility] Hematology/Oncology Physician [facility]  (Office):       [phone] (Work cell):  [phone] (Fax):           [phone]  03/21/2020 3:46 PM   I, Yevette Edwards, am acting as a scribe for Dr. Sullivan Lone.   .I have reviewed the above documentation for accuracy and completeness, and I agree with the above. Brunetta Genera MD   ADDENDUM  Hemochromatosis DNA, PCR Order: [id number] Status:  Final result Visible to patient:  No (inaccessible in [place]) Next appt:  06/28/2020 at 10:00 AM in [place] Wendie Agreste, MD) Dx:  Elevated ferritin Component 6 d ago  DNA Mutation Analysis Comment   Comment: (NOTE)  NO MUTATION IDENTIFIED          . Lab Results  Component Value Date   IRON 78  03/21/2020   TIBC 290 03/21/2020   IRONPCTSAT 27 03/21/2020   (Iron and TIBC)  Lab Results  Component Value Date   FERRITIN 133 03/21/2020    A-- no mutation suggestive of hemochromatosis. Ferritin level better and are WNL PLAN -continue f/u with PCP -no indication for therapeutic phlebotomies

## 2020-03-24 LAB — HEMOCHROMATOSIS DNA-PCR(C282Y,H63D)

## 2020-03-30 ENCOUNTER — Telehealth: Payer: Self-pay | Admitting: *Deleted

## 2020-03-30 NOTE — Telephone Encounter (Signed)
-----   Message from Brunetta Genera, MD sent at 03/27/2020 11:53 PM EDT ----- Plz let patient know  no mutation suggestive of hereditary hemochromatosis. Ferritin level better and are WNL -continue f/u with PCP -no indication for therapeutic phlebotomies

## 2020-03-30 NOTE — Telephone Encounter (Signed)
Contacted patient regarding test results per Dr. Grier Mitts directions in previous message. Patient verbalized understanding.

## 2020-04-05 ENCOUNTER — Other Ambulatory Visit: Payer: Self-pay | Admitting: Family Medicine

## 2020-06-28 ENCOUNTER — Ambulatory Visit: Payer: PPO | Admitting: Family Medicine

## 2020-06-29 ENCOUNTER — Ambulatory Visit: Payer: PPO | Admitting: Family Medicine

## 2020-07-01 ENCOUNTER — Other Ambulatory Visit: Payer: Self-pay | Admitting: Family Medicine

## 2020-07-10 ENCOUNTER — Other Ambulatory Visit: Payer: Self-pay | Admitting: Family Medicine

## 2020-07-10 ENCOUNTER — Ambulatory Visit (INDEPENDENT_AMBULATORY_CARE_PROVIDER_SITE_OTHER): Payer: PPO

## 2020-07-10 ENCOUNTER — Encounter: Payer: Self-pay | Admitting: Family Medicine

## 2020-07-10 ENCOUNTER — Ambulatory Visit (INDEPENDENT_AMBULATORY_CARE_PROVIDER_SITE_OTHER): Payer: PPO | Admitting: Family Medicine

## 2020-07-10 ENCOUNTER — Other Ambulatory Visit: Payer: Self-pay

## 2020-07-10 VITALS — BP 136/78 | HR 79 | Temp 97.7°F | Ht 66.0 in | Wt 249.0 lb

## 2020-07-10 DIAGNOSIS — G5710 Meralgia paresthetica, unspecified lower limb: Secondary | ICD-10-CM | POA: Diagnosis not present

## 2020-07-10 DIAGNOSIS — M79671 Pain in right foot: Secondary | ICD-10-CM | POA: Diagnosis not present

## 2020-07-10 DIAGNOSIS — E785 Hyperlipidemia, unspecified: Secondary | ICD-10-CM | POA: Diagnosis not present

## 2020-07-10 DIAGNOSIS — E039 Hypothyroidism, unspecified: Secondary | ICD-10-CM

## 2020-07-10 NOTE — Progress Notes (Signed)
Subjective:  Patient ID: Veronica Martinez, female    DOB: [birth year]  Age: 64 y.o. MRN: [id number]  CC:  Chief Complaint  Patient presents with  . knot on top of R foot    [facility]   . Hypothyroidism    no concerns 6 month follow up     HPI Veronica Martinez presents for   Hypothyroidism: Lab Results  Component Value Date   TSH 2.650 07/10/2020  Synthroid 100 mcg daily Taking medication daily.  No new hot or cold intolerance. No new hair or skin changes, heart palpitations or new fatigue. No new weight changes.   Elevated blood pressure at hematology in March, normal previously, she is not on antihypertensives. BP Readings from Last 3 Encounters:  07/10/20 136/78  03/21/20 (!) 162/74  12/27/19 124/74    Hyperlipidemia: Takes Crestor 5 mg daily. No new side effects/myalgias.  Lab Results  Component Value Date   CHOL 186 07/10/2020   HDL 107 07/10/2020   LDLCALC 69 07/10/2020   TRIG 53 07/10/2020   CHOLHDL 1.7 07/10/2020   Lab Results  Component Value Date   ALT 20 07/10/2020   AST 24 07/10/2020   ALKPHOS 89 07/10/2020   BILITOT 0.3 07/10/2020    Meralgia paresthetica: Gabapentin 600 mg twice daily. Controlling symptoms. No new pain/swelling in legs   R foot swelling: Started walking program at [facility] 4 weeks ago.  Bump noted on front of ankle past 2 weeks. Some soreness to that area only. No pain in other areas of ankle. Some less walking past 2 weeks with soreness. Able to weight bear.  NKI.  Tx: bengay muscle rub - helps sx's. No oral medications.     History Patient Active Problem List   Diagnosis Date Noted  . 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 Right hip pain 08/09/2013  . Trigger finger, acquired 04/22/2013  . Carpal tunnel syndrome of right wrist 04/22/2013  . Pain of right heel 12/08/2012  . Myalgia 11/10/2012  . OBESITY, UNSPECIFIED 02/20/2011   . BACK PAIN, LUMBAR 07/05/2010  . ABNORMALITY OF GAIT 05/02/2010  . MERALGIA PARESTHETICA 03/02/2010  . HIP PAIN, BILATERAL 03/02/2010   Past Medical History:  Diagnosis Date  . Allergy   . GERD (gastroesophageal reflux disease)   . Headache(784.0)    sinus  . High cholesterol   . History of Graves' disease   . Hx of radioactive iodine thyroid ablation   . Hypothyroidism    Past Surgical History:  Procedure Laterality Date  . ABDOMINAL HYSTERECTOMY    . APPENDECTOMY    . CATARACT EXTRACTION W/PHACO Right 09/22/2013   Procedure: CATARACT EXTRACTION PHACO AND INTRAOCULAR LENS PLACEMENT (IOC);  Surgeon: [name], MD;  Location: [facility];  Service: Ophthalmology;  Laterality: Right;  . CATARACT EXTRACTION W/PHACO Left 12/29/2013   Procedure: CATARACT EXTRACTION PHACO AND INTRAOCULAR LENS PLACEMENT (IOC);  Surgeon: [name], MD;  Location: [facility];  Service: Ophthalmology;  Laterality: Left;  . COLONOSCOPY    . EYE SURGERY    . HIP ARTHROPLASTY Bilateral   . JOINT REPLACEMENT    . OTHER SURGICAL HISTORY Right    carpal tunel injection  . SHOULDER SURGERY    . TONSILLECTOMY     Allergies  Allergen Reactions  . Meloxicam Other (See Comments)    Causes Bruising   . Pravastatin Sodium Other (See Comments)    Bruises  .  Simvastatin Other (See Comments)    Bruises   . Tramadol Hcl Other (See Comments)    Bruising    Prior to Admission medications   Medication Sig Start Date End Date Taking? Authorizing Provider  acetaminophen (TYLENOL) 650 MG CR tablet Take 650 mg by mouth every 8 (eight) hours as needed for pain.   Yes [provider]  Calcium Carbonate (CALCIUM 600 PO) Take by mouth.   Yes [provider]  Cholecalciferol (VITAMIN D) 50 MCG (2000 UT) tablet Take 2,000 Units by mouth daily.   Yes [provider]  Coenzyme Q10 (CO Q 10 PO) Take 75 mg by mouth 3 (three) times daily.   Yes [provider]  fluticasone (FLONASE) 50 MCG/ACT  nasal spray SPRAY 2 SPRAYS INTO EACH NOSTRIL EVERY DAY 07/01/20  Yes Wendie Agreste, MD  gabapentin (NEURONTIN) 600 MG tablet Take 1 tablet (600 mg total) by mouth 2 (two) times daily. 12/27/19  Yes Wendie Agreste, MD  ibuprofen (ADVIL,MOTRIN) 600 MG tablet Take 1 tablet (600 mg total) by mouth every 8 (eight) hours as needed (with food). 10/21/17  Yes Wendie Agreste, MD  Misc Natural Products (CALCIUM PLUS ADVANCED PO) Take 600 mg by mouth 2 (two) times daily.    Yes [provider]  Multiple Vitamins-Minerals (CENTRUM SILVER PO) Take by mouth.   Yes [provider]  Omega-3 Fatty Acids (FISH OIL) 1200 MG CAPS Take 1,200 mg by mouth 3 (three) times daily.    Yes [provider]  pantoprazole (PROTONIX) 40 MG tablet Take 1 tablet (40 mg total) by mouth daily. 01/16/17  Yes Wendie Agreste, MD  rosuvastatin (CRESTOR) 5 MG tablet TAKE 1 TABLET BY MOUTH DAILY AT 6 PM 12/30/19  Yes Wendie Agreste, MD  [facility] injection  08/18/19  Yes [provider]  SYNTHROID 100 MCG tablet Take 1 tablet (100 mcg total) by mouth daily before breakfast. 12/27/19  Yes Wendie Agreste, MD  vitamin E 400 UNIT capsule Take 400 Units by mouth daily.   Yes [provider]   Social History   Socioeconomic History  . Marital status: Married    Spouse name: Not on file  . Number of children: Not on file  . Years of education: Not on file  . Highest education level: Not on file  Occupational History  . Not on file  Tobacco Use  . Smoking status: Never Smoker  . Smokeless tobacco: Never Used  Substance and Sexual Activity  . Alcohol use: No  . Drug use: No  . Sexual activity: Yes    Partners: Male  Other Topics Concern  . Not on file  Social History Narrative   Married. Education: [employer].    Social Determinants of Health   Financial Resource Strain:   . Difficulty of Paying Living Expenses:   Food Insecurity:   . Worried About Charity fundraiser in  the Last Year:   . Arboriculturist in the Last Year:   Transportation Needs:   . Film/video editor (Medical):   [name] Lack of Transportation (Non-Medical):   Physical Activity:   . Days of Exercise per Week:   . Minutes of Exercise per Session:   Stress:   . Feeling of Stress :   Social Connections:   . Frequency of Communication with Friends and Family:   . Frequency of Social Gatherings with Friends and Family:   . Attends Religious Services:   .  Active Member of Clubs or Organizations:   . Attends Archivist Meetings:   [name] Marital Status:   Intimate Partner Violence:   . Fear of Current or Ex-Partner:   . Emotionally Abused:   [name] Physically Abused:   . Sexually Abused:     Review of Systems Per HPI  Objective:   Vitals:   07/10/20 1602  BP: 136/78  Pulse: 79  Temp: 97.7 F (36.5 C)  SpO2: 97%  Weight: 249 lb (112.9 kg)  Height: 5' 6"  (1.676 m)     Physical Exam Vitals reviewed.  Constitutional:      Appearance: She is well-developed.  HENT:     Head: Normocephalic and atraumatic.  Eyes:     Conjunctiva/sclera: Conjunctivae normal.     Pupils: Pupils are equal, round, and reactive to light.  Neck:     Vascular: No carotid bruit.  Cardiovascular:     Rate and Rhythm: Normal rate and regular rhythm.     Heart sounds: Normal heart sounds.  Pulmonary:     Effort: Pulmonary effort is normal.     Breath sounds: Normal breath sounds.  Abdominal:     Palpations: Abdomen is soft. There is no pulsatile mass.     Tenderness: There is no abdominal tenderness.  Musculoskeletal:     Comments: Right foot, slight tenderness over the dorsal midfoot, slight tenderness at the tibialis anterior but primarily over the bony aspect of midfoot.  Possible prominence compared to the left side but no appreciable soft tissue swelling otherwise, no erythema, neurovascular intact distally.  Fifth metatarsal and navicular nontender.  Skin:    General: Skin is warm and dry.   Neurological:     Mental Status: She is alert and oriented to person, place, and time.  Psychiatric:        Behavior: Behavior normal.       DG Foot Complete Right  Result Date: 07/10/2020 CLINICAL DATA:  64 year old female with pain over the dorsum of the midfoot. No known injury. EXAM: RIGHT FOOT COMPLETE - 3+ VIEW COMPARISON:  None. FINDINGS: There is no acute fracture or dislocation. The bones are mildly osteopenic. No significant arthritic changes. The soft tissues are unremarkable. IMPRESSION: Negative. Electronically Signed   By: Anner Crete M.D.   On: 07/10/2020 17:50    Assessment & Plan:  Veronica Martinez is a 64 y.o. female . Hyperlipidemia, unspecified hyperlipidemia type - Plan: CMP14+EGFR, Lipid panel  -  Stable, tolerating current regimen.Labs pending as above.   Hypothyroidism, unspecified type - Plan: TSH  -Clinically stable, check TSH.  Denies any symptoms.  Continue same dose Synthroid for now  Meralgia paresthetica, unspecified laterality  -Stable with gabapentin current dose, no changes.  Pain of right midfoot - Plan: DG Foot Complete Right  -Possible overuse/tendinitis of tibialis anterior less likely stress injury of midfoot bones.  Imaging reassuring at present. relative rest with RTC precautions if not continuing to improve in the next week or 2, option of orthopedic specialist eval.  No orders of the defined types were placed in this encounter.  Patient Instructions    I will check some blood work.  No medication changes at this time.  If you do notice blood pressures over 140 on top number, or over 90 on the bottom number, return for recheck.    Try relative rest for the right foot over the next 2 weeks, walking short distances okay but avoid any activities that make the pain worse.  If pain is not improving during that time, let me know and I would like you to be seen by an orthopedist.  Ice over area is okay temporarily if needed.   Return  to the clinic or go to the nearest emergency room if any of your symptoms worsen or new symptoms occur.    If you have lab work done today you will be contacted with your lab results within the next 2 weeks.  If you have not heard from [place] then please contact us. The fastest way to get your results is to register for My Chart.   IF you received an x-ray today, you will receive an invoice from [facility]. Please contact [facility] at [phone] with questions or concerns regarding your invoice.   IF you received labwork today, you will receive an invoice from [facility]. Please contact [facility] at [phone] with questions or concerns regarding your invoice.   Our billing staff will not be able to assist you with questions regarding bills from these companies.  You will be contacted with the lab results as soon as they are available. The fastest way to get your results is to activate your My Chart account. Instructions are located on the last page of this paperwork. If you have not heard from [place] regarding the results in 2 weeks, please contact this office.          Signed, [name], MD [facility]

## 2020-07-10 NOTE — Patient Instructions (Addendum)
  I will check some blood work.  No medication changes at this time.  If you do notice blood pressures over 140 on top number, or over 90 on the bottom number, return for recheck.    Try relative rest for the right foot over the next 2 weeks, walking short distances okay but avoid any activities that make the pain worse.  If pain is not improving during that time, let me know and I would like you to be seen by an orthopedist.  Ice over area is okay temporarily if needed.   Return to the clinic or go to the nearest emergency room if any of your symptoms worsen or new symptoms occur.    If you have lab work done today you will be contacted with your lab results within the next 2 weeks.  If you have not heard from [place] then please contact us. The fastest way to get your results is to register for My Chart.   IF you received an x-ray today, you will receive an invoice from [facility]. Please contact [facility] at [phone] with questions or concerns regarding your invoice.   IF you received labwork today, you will receive an invoice from [place]. Please contact [facility] at [phone] with questions or concerns regarding your invoice.   Our billing staff will not be able to assist you with questions regarding bills from these companies.  You will be contacted with the lab results as soon as they are available. The fastest way to get your results is to activate your My Chart account. Instructions are located on the last page of this paperwork. If you have not heard from [place] regarding the results in 2 weeks, please contact this office.

## 2020-07-11 ENCOUNTER — Encounter: Payer: Self-pay | Admitting: Family Medicine

## 2020-07-11 LAB — CMP14+EGFR
ALT: 20 IU/L (ref 0–32)
AST: 24 IU/L (ref 0–40)
Albumin/Globulin Ratio: 1.8 (ref 1.2–2.2)
Albumin: 4.9 g/dL — ABNORMAL HIGH (ref 3.8–4.8)
Alkaline Phosphatase: 89 IU/L (ref 48–121)
BUN/Creatinine Ratio: 14 (ref 12–28)
BUN: 10 mg/dL (ref 8–27)
Bilirubin Total: 0.3 mg/dL (ref 0.0–1.2)
CO2: 26 mmol/L (ref 20–29)
Calcium: 10.6 mg/dL — ABNORMAL HIGH (ref 8.7–10.3)
Chloride: 102 mmol/L (ref 96–106)
Creatinine, Ser: 0.74 mg/dL (ref 0.57–1.00)
GFR calc Af Amer: 98 mL/min/{1.73_m2} (ref >59)
GFR calc non Af Amer: 85 mL/min/{1.73_m2} (ref >59)
Globulin, Total: 2.8 g/dL (ref 1.5–4.5)
Glucose: 76 mg/dL (ref 65–99)
Potassium: 4.6 mmol/L (ref 3.5–5.2)
Sodium: 141 mmol/L (ref 134–144)
Total Protein: 7.7 g/dL (ref 6.0–8.5)

## 2020-07-11 LAB — TSH: TSH: 2.65 u[IU]/mL (ref 0.450–4.500)

## 2020-07-11 LAB — LIPID PANEL
Chol/HDL Ratio: 1.7 ratio (ref 0.0–4.4)
Cholesterol, Total: 186 mg/dL (ref 100–199)
HDL: 107 mg/dL (ref >39)
LDL Chol Calc (NIH): 69 mg/dL (ref 0–99)
Triglycerides: 53 mg/dL (ref 0–149)
VLDL Cholesterol Cal: 10 mg/dL (ref 5–40)

## 2020-07-13 ENCOUNTER — Other Ambulatory Visit: Payer: Self-pay | Admitting: Family Medicine

## 2020-07-13 DIAGNOSIS — E039 Hypothyroidism, unspecified: Secondary | ICD-10-CM

## 2020-07-25 DIAGNOSIS — Z79899 Other long term (current) drug therapy: Secondary | ICD-10-CM | POA: Diagnosis not present

## 2020-07-25 DIAGNOSIS — K219 Gastro-esophageal reflux disease without esophagitis: Secondary | ICD-10-CM | POA: Diagnosis not present

## 2020-07-31 ENCOUNTER — Other Ambulatory Visit: Payer: Self-pay | Admitting: Obstetrics and Gynecology

## 2020-07-31 DIAGNOSIS — Z1231 Encounter for screening mammogram for malignant neoplasm of breast: Secondary | ICD-10-CM

## 2020-08-08 ENCOUNTER — Ambulatory Visit: Payer: Self-pay

## 2020-08-10 ENCOUNTER — Other Ambulatory Visit: Payer: Self-pay

## 2020-08-10 ENCOUNTER — Ambulatory Visit
Admission: RE | Admit: 2020-08-10 | Discharge: 2020-08-10 | Disposition: A | Discharge status: Home / Self Care | Payer: PPO | Source: Ambulatory Visit | Attending: Obstetrics and Gynecology | Admitting: Obstetrics and Gynecology

## 2020-08-10 DIAGNOSIS — Z1231 Encounter for screening mammogram for malignant neoplasm of breast: Secondary | ICD-10-CM

## 2020-09-21 ENCOUNTER — Ambulatory Visit (INDEPENDENT_AMBULATORY_CARE_PROVIDER_SITE_OTHER): Payer: PPO | Admitting: Emergency Medicine

## 2020-09-21 ENCOUNTER — Other Ambulatory Visit: Payer: Self-pay

## 2020-09-21 DIAGNOSIS — Z6841 Body Mass Index (BMI) 40.0 and over, adult: Secondary | ICD-10-CM | POA: Diagnosis not present

## 2020-09-21 DIAGNOSIS — Z23 Encounter for immunization: Secondary | ICD-10-CM

## 2020-09-21 DIAGNOSIS — Z01419 Encounter for gynecological examination (general) (routine) without abnormal findings: Secondary | ICD-10-CM | POA: Diagnosis not present

## 2020-10-17 ENCOUNTER — Telehealth (INDEPENDENT_AMBULATORY_CARE_PROVIDER_SITE_OTHER): Payer: PPO | Admitting: Emergency Medicine

## 2020-10-17 ENCOUNTER — Other Ambulatory Visit: Payer: Self-pay

## 2020-10-17 ENCOUNTER — Encounter: Payer: Self-pay | Admitting: Emergency Medicine

## 2020-10-17 DIAGNOSIS — J22 Unspecified acute lower respiratory infection: Secondary | ICD-10-CM

## 2020-10-17 DIAGNOSIS — R059 Cough, unspecified: Secondary | ICD-10-CM | POA: Diagnosis not present

## 2020-10-17 MED ORDER — AZITHROMYCIN 250 MG PO TABS: ORAL_TABLET | ORAL | 0 refills | Status: DC

## 2020-10-17 MED ORDER — BENZONATATE 200 MG PO CAPS: 200.0000 mg | ORAL_CAPSULE | Freq: Two times a day (BID) | ORAL | 0 refills | Status: DC | PRN

## 2020-10-17 MED ORDER — PROMETHAZINE-DM 6.25-15 MG/5ML PO SYRP: 5.0000 mL | ORAL_SOLUTION | Freq: Four times a day (QID) | ORAL | 0 refills | Status: DC | PRN

## 2020-10-17 NOTE — Progress Notes (Signed)
Telemedicine Encounter- SOAP NOTE Established Patient  This telephone encounter was conducted with the patient's (or proxy's) verbal consent via audio telecommunications: yes/no: Yes Patient was instructed to have this encounter in a suitably private space; and to only have persons present to whom they give permission to participate. In addition, patient identity was confirmed by use of name plus two identifiers (DOB and address).  I discussed the limitations, risks, security and privacy concerns of performing an evaluation and management service by telephone and the availability of in person appointments. I also discussed with the patient that there may be a patient responsible charge related to this service. The patient expressed understanding and agreed to proceed.  I spent a total of TIME; 0 MIN TO 60 MIN: 20 minutes talking with the patient or their proxy.  Chief Complaint  Patient presents with  . Illness    aving progressive cough and draining that is starting to get worse. Taking mucinex, denies fever. Took home covid test, result -    Subjective   Veronica Martinez is a 64 y.o. female established patient. Telephone visit today complaining of flulike symptoms that started 5 days ago with productive cough fever chills and chest congestion. Covid test done at home was negative. No additional symptoms. Able to eat and drink. Denies nausea vomiting or diarrhea. Denies difficulty breathing or chest pain. No other significant symptoms.  HPI   Patient Active Problem List   Diagnosis Date Noted  . 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 Right hip pain 08/09/2013  . Trigger finger, acquired 04/22/2013  . Carpal tunnel syndrome of right wrist 04/22/2013  . Pain of right heel 12/08/2012  . Myalgia 11/10/2012  . OBESITY, UNSPECIFIED 02/20/2011  . BACK PAIN, LUMBAR 07/05/2010  . ABNORMALITY OF GAIT 05/02/2010  .  MERALGIA PARESTHETICA 03/02/2010  . HIP PAIN, BILATERAL 03/02/2010    Past Medical History:  Diagnosis Date  . Allergy   . GERD (gastroesophageal reflux disease)   . Headache(784.0)    sinus  . High cholesterol   . History of Graves' disease   . Hx of radioactive iodine thyroid ablation   . Hypothyroidism     Current Outpatient Medications  Medication Sig Dispense Refill  . acetaminophen (TYLENOL) 650 MG CR tablet Take 650 mg by mouth every 8 (eight) hours as needed for pain.    . Calcium Carbonate (CALCIUM 600 PO) Take by mouth.    . Cholecalciferol (VITAMIN D) 50 MCG (2000 UT) tablet Take 2,000 Units by mouth daily.    . Coenzyme Q10 (CO Q 10 PO) Take 75 mg by mouth 3 (three) times daily.    . fluticasone (FLONASE) 50 MCG/ACT nasal spray SPRAY 2 SPRAYS INTO EACH NOSTRIL EVERY DAY 48 mL 1  . gabapentin (NEURONTIN) 600 MG tablet Take 1 tablet (600 mg total) by mouth 2 (two) times daily. 180 tablet 2  . ibuprofen (ADVIL,MOTRIN) 600 MG tablet Take 1 tablet (600 mg total) by mouth every 8 (eight) hours as needed (with food). 30 tablet 0  . Misc Natural Products (CALCIUM PLUS ADVANCED PO) Take 600 mg by mouth 2 (two) times daily.     . Multiple Vitamins-Minerals (CENTRUM SILVER PO) Take by mouth.    . Omega-3 Fatty Acids (FISH OIL) 1200 MG CAPS Take 1,200 mg by mouth 3 (three) times daily.     . pantoprazole (PROTONIX) 40 MG tablet Take 1 tablet (40 mg total)  by mouth daily. 30 tablet 3  . rosuvastatin (CRESTOR) 5 MG tablet TAKE 1 TABLET BY MOUTH DAILY AT 6 PM 90 tablet 2  . SHINGRIX injection     . SYNTHROID 100 MCG tablet TAKE 1 TABLET BY MOUTH DAILY BEFORE BREAKFAST. 90 tablet 3  . vitamin E 400 UNIT capsule Take 400 Units by mouth daily.     No current facility-administered medications for this visit.    Allergies  Allergen Reactions  . Meloxicam Other (See Comments)    Causes Bruising   . Pravastatin Sodium Other (See Comments)    Bruises  . Simvastatin Other (See  Comments)    Bruises   . Tramadol Hcl Other (See Comments)    Bruising     Social History   Socioeconomic History  . Marital status: Married    Spouse name: Not on file  . Number of children: Not on file  . Years of education: Not on file  . Highest education level: Not on file  Occupational History  . Not on file  Tobacco Use  . Smoking status: Never Smoker  . Smokeless tobacco: Never Used  Substance and Sexual Activity  . Alcohol use: No  . Drug use: No  . Sexual activity: Yes    Partners: Male  Other Topics Concern  . Not on file  Social History Narrative   Married. Education: [employer].    Social Determinants of Health   Financial Resource Strain:   . Difficulty of Paying Living Expenses: Not on file  Food Insecurity:   . Worried About Charity fundraiser in the Last Year: Not on file  . Ran Out of Food in the Last Year: Not on file  Transportation Needs:   . Lack of Transportation (Medical): Not on file  . Lack of Transportation (Non-Medical): Not on file  Physical Activity:   . Days of Exercise per Week: Not on file  . Minutes of Exercise per Session: Not on file  Stress:   . Feeling of Stress : Not on file  Social Connections:   . Frequency of Communication with Friends and Family: Not on file  . Frequency of Social Gatherings with Friends and Family: Not on file  . Attends Religious Services: Not on file  . Active Member of Clubs or Organizations: Not on file  . Attends Archivist Meetings: Not on file  . Marital Status: Not on file  Intimate Partner Violence:   . Fear of Current or Ex-Partner: Not on file  . Emotionally Abused: Not on file  . Physically Abused: Not on file  . Sexually Abused: Not on file    Review of Systems  Constitutional: Positive for chills. Negative for fever.  HENT: Positive for congestion. Negative for sore throat.   Respiratory: Positive for cough and sputum production. Negative for shortness of breath and  wheezing.   Cardiovascular: Negative for chest pain and palpitations.  Gastrointestinal: Negative.  Negative for abdominal pain, diarrhea, nausea and vomiting.  Genitourinary: Negative.  Negative for dysuria.  Skin: Negative.  Negative for rash.  Neurological: Negative.  Negative for dizziness and headaches.  All other systems reviewed and are negative.   Objective  Alert and oriented x3 in no apparent respiratory distress Vitals as reported by the patient: There were no vitals filed for this visit.  There are no diagnoses linked to this encounter. Veronica Martinez was seen today for illness.  Diagnoses and all orders for this visit:  Cough -  benzonatate (TESSALON) 200 MG capsule; Take 1 capsule (200 mg total) by mouth 2 (two) times daily as needed for cough. -     promethazine-dextromethorphan (PROMETHAZINE-DM) 6.25-15 MG/5ML syrup; Take 5 mLs by mouth 4 (four) times daily as needed for cough.  Lower respiratory infection (e.g., bronchitis, pneumonia, pneumonitis, pulmonitis) -     azithromycin (ZITHROMAX) 250 MG tablet; Sig as indicated     I discussed the assessment and treatment plan with the patient. The patient was provided an opportunity to ask questions and all were answered. The patient agreed with the plan and demonstrated an understanding of the instructions.   The patient was advised to call back or seek an in-person evaluation if the symptoms worsen or if the condition fails to improve as anticipated.  I provided 20 minutes of non-face-to-face time during this encounter.  Horald Pollen, MD  Primary Care at [facility]

## 2020-10-27 DIAGNOSIS — H0102A Squamous blepharitis right eye, upper and lower eyelids: Secondary | ICD-10-CM | POA: Diagnosis not present

## 2020-10-27 DIAGNOSIS — H0102B Squamous blepharitis left eye, upper and lower eyelids: Secondary | ICD-10-CM | POA: Diagnosis not present

## 2020-10-27 DIAGNOSIS — H0264 Xanthelasma of left upper eyelid: Secondary | ICD-10-CM | POA: Diagnosis not present

## 2020-10-27 DIAGNOSIS — H26491 Other secondary cataract, right eye: Secondary | ICD-10-CM | POA: Diagnosis not present

## 2020-10-27 DIAGNOSIS — Z961 Presence of intraocular lens: Secondary | ICD-10-CM | POA: Diagnosis not present

## 2020-10-27 DIAGNOSIS — H35372 Puckering of macula, left eye: Secondary | ICD-10-CM | POA: Diagnosis not present

## 2020-10-27 DIAGNOSIS — H10413 Chronic giant papillary conjunctivitis, bilateral: Secondary | ICD-10-CM | POA: Diagnosis not present

## 2020-10-27 DIAGNOSIS — H43813 Vitreous degeneration, bilateral: Secondary | ICD-10-CM | POA: Diagnosis not present

## 2020-11-09 ENCOUNTER — Ambulatory Visit (INDEPENDENT_AMBULATORY_CARE_PROVIDER_SITE_OTHER): Payer: PPO | Admitting: Family Medicine

## 2020-11-09 VITALS — BP 136/78 | Ht 68.0 in | Wt 249.0 lb

## 2020-11-09 DIAGNOSIS — Z0000 Encounter for general adult medical examination without abnormal findings: Secondary | ICD-10-CM

## 2020-11-09 NOTE — Patient Instructions (Addendum)
Thank you for taking time to come for your Medicare Wellness Visit. I appreciate your ongoing commitment to your health goals. Please review the following plan we discussed and let me know if I can assist you in the future.  Anvi Mangal LPN  Preventive Care 65 Years and Older, Female Preventive care refers to lifestyle choices and visits with your health care provider that can promote health and wellness. This includes:  A yearly physical exam. This is also called an annual well check.  Regular dental and eye exams.  Immunizations.  Screening for certain conditions.  Healthy lifestyle choices, such as diet and exercise. What can I expect for my preventive care visit? Physical exam Your health care provider will check:  Height and weight. These may be used to calculate body mass index (BMI), which is a measurement that tells if you are at a healthy weight.  Heart rate and blood pressure.  Your skin for abnormal spots. Counseling Your health care provider may ask you questions about:  Alcohol, tobacco, and drug use.  Emotional well-being.  Home and relationship well-being.  Sexual activity.  Eating habits.  History of falls.  Memory and ability to understand (cognition).  Work and work environment.  Pregnancy and menstrual history. What immunizations do I need?  Influenza (flu) vaccine  This is recommended every year. Tetanus, diphtheria, and pertussis (Tdap) vaccine  You may need a Td booster every 10 years. Varicella (chickenpox) vaccine  You may need this vaccine if you have not already been vaccinated. Zoster (shingles) vaccine  You may need this after age 60. Pneumococcal conjugate (PCV13) vaccine  One dose is recommended after age 65. Pneumococcal polysaccharide (PPSV23) vaccine  One dose is recommended after age 65. Measles, mumps, and rubella (MMR) vaccine  You may need at least one dose of MMR if you were born in 1957 or later. You may also  need a second dose. Meningococcal conjugate (MenACWY) vaccine  You may need this if you have certain conditions. Hepatitis A vaccine  You may need this if you have certain conditions or if you travel or work in places where you may be exposed to hepatitis A. Hepatitis B vaccine  You may need this if you have certain conditions or if you travel or work in places where you may be exposed to hepatitis B. Haemophilus influenzae type b (Hib) vaccine  You may need this if you have certain conditions. You may receive vaccines as individual doses or as more than one vaccine together in one shot (combination vaccines). Talk with your health care provider about the risks and benefits of combination vaccines. What tests do I need? Blood tests  Lipid and cholesterol levels. These may be checked every 5 years, or more frequently depending on your overall health.  Hepatitis C test.  Hepatitis B test. Screening  Lung cancer screening. You may have this screening every year starting at age 55 if you have a 30-pack-year history of smoking and currently smoke or have quit within the past 15 years.  Colorectal cancer screening. All adults should have this screening starting at age 50 and continuing until age 75. Your health care provider may recommend screening at age 45 if you are at increased risk. You will have tests every 1-10 years, depending on your results and the type of screening test.  Diabetes screening. This is done by checking your blood sugar (glucose) after you have not eaten for a while (fasting). You may have this done every 1-3   years.  Mammogram. This may be done every 1-2 years. Talk with your health care provider about how often you should have regular mammograms.  BRCA-related cancer screening. This may be done if you have a family history of breast, ovarian, tubal, or peritoneal cancers. Other tests  Sexually transmitted disease (STD) testing.  Bone density scan. This is done  to screen for osteoporosis. You may have this done starting at age 65. Follow these instructions at home: Eating and drinking  Eat a diet that includes fresh fruits and vegetables, whole grains, lean protein, and low-fat dairy products. Limit your intake of foods with high amounts of sugar, saturated fats, and salt.  Take vitamin and mineral supplements as recommended by your health care provider.  Do not drink alcohol if your health care provider tells you not to drink.  If you drink alcohol: ? Limit how much you have to 0-1 drink a day. ? Be aware of how much alcohol is in your drink. In the [place] one drink equals one 12 oz bottle of beer (355 mL), one 5 oz glass of wine (148 mL), or one 1 oz glass of hard liquor (44 mL). Lifestyle  Take daily care of your teeth and gums.  Stay active. Exercise for at least 30 minutes on 5 or more days each week.  Do not use any products that contain nicotine or tobacco, such as cigarettes, e-cigarettes, and chewing tobacco. If you need help quitting, ask your health care provider.  If you are sexually active, practice safe sex. Use a condom or other form of protection in order to prevent STIs (sexually transmitted infections).  Talk with your health care provider about taking a low-dose aspirin or statin. What's next?  Go to your health care provider once a year for a well check visit.  Ask your health care provider how often you should have your eyes and teeth checked.  Stay up to date on all vaccines. This information is not intended to replace advice given to you by your health care provider. Make sure you discuss any questions you have with your health care provider. Document Revised: 12/03/2018 Document Reviewed: 12/03/2018 Elsevier Patient Education  2020 [employer]  

## 2020-11-09 NOTE — Progress Notes (Signed)
Presents today for [employer] Visit   Date of last exam:  10/17/2020  Interpreter used for this visit? No  I connected with  Veronica Martinez on 11/09/20 by a telephone and verified that I am speaking with the correct person using two identifiers.   I discussed the limitations of evaluation and management by telemedicine. The patient expressed understanding and agreed to proceed.  Patient location: home  Provider location: in office  I provided 20 minutes of non face - to - face time during this encounter.    Patient Care Team: Wendie Agreste, MD as PCP - General (Family Medicine) Warden Fillers, MD as Consulting Physician (Ophthalmology) Ronald Lobo, MD as Consulting Physician (Gastroenterology)   Other items to address today:   Discussed immunizations Discussed Eye/Dental   Other Screening: Last screening for diabetes: 07-10-2020 Last lipid screening: 07/10/2020  ADVANCE DIRECTIVES: Discussed: yes On File: no Materials Provided: yes  Immunization status:  Immunization History  Administered Date(s) Administered  . Fluad Quad(high Dose 65+) 08/18/2019, 09/21/2020  . Influenza Split 09/01/2012  . Influenza,inj,Quad PF,6+ Mos 09/07/2013, 01/10/2015, 12/26/2015, 08/21/2017, 02/15/2019  . PFIZER SARS-COV-2 Vaccination 02/05/2020, 03/01/2020, 09/25/2020  . Pneumococcal Conjugate-13 08/18/2019  . Tdap 01/10/2015  . Zoster Recombinat (Shingrix) 08/18/2019, 06/22/2020     Health Maintenance Due  Topic Date Due  . PNA vac Low Risk Adult (2 of 2 - PPSV23) 08/17/2020     Functional Status Survey: Is the patient deaf or have difficulty hearing?: No Does the patient have difficulty seeing, even when wearing glasses/contacts?: No Does the patient have difficulty concentrating, remembering, or making decisions?: No Does the patient have difficulty walking or climbing stairs?: No Does the patient have difficulty dressing or bathing?:  No Does the patient have difficulty doing errands alone such as visiting a doctor's office or shopping?: No   6CIT Screen 11/09/2020 11/09/2020 08/18/2019 08/03/2019 08/21/2017  What Year? 0 points 0 points 0 points 0 points 0 points  What month? 0 points 0 points 0 points 0 points 0 points  What time? - 0 points 0 points 0 points 0 points  Count back from 20 - 0 points 0 points 0 points 0 points  Months in reverse - 2 points 0 points 0 points 0 points  Repeat phrase - 0 points 0 points 0 points 6 points  Total Score - 2 0 0 6        Clinical Support from 11/09/2020 in [facility]  AUDIT-C Score 0       Home Environment:   Lives in a one story home with husband No trouble climbing stairs No grab bars No scattered rugs No clutter/ adequate lighting   Patient Active Problem List   Diagnosis Date Noted  . 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 Right hip pain 08/09/2013  . Trigger finger, acquired 04/22/2013  . Carpal tunnel syndrome of right wrist 04/22/2013  . Pain of right heel 12/08/2012  . Myalgia 11/10/2012  . OBESITY, UNSPECIFIED 02/20/2011  . BACK PAIN, LUMBAR 07/05/2010  . ABNORMALITY OF GAIT 05/02/2010  . MERALGIA PARESTHETICA 03/02/2010  . HIP PAIN, BILATERAL 03/02/2010     Past Medical History:  Diagnosis Date  . Allergy   . GERD (gastroesophageal reflux disease)   . Headache(784.0)    sinus  . High cholesterol   . History of Graves' disease   . Hx of radioactive iodine thyroid ablation   .  Hyperlipidemia    Phreesia 11/08/2020  . Hypothyroidism   . Sickle cell anemia ([place])    Phreesia 11/08/2020     Past Surgical History:  Procedure Laterality Date  . ABDOMINAL HYSTERECTOMY    . APPENDECTOMY    . CATARACT EXTRACTION W/PHACO Right 09/22/2013   Procedure: CATARACT EXTRACTION PHACO AND INTRAOCULAR LENS PLACEMENT (IOC);  Surgeon: [name], MD;  Location: [facility] [place];  Service:  Ophthalmology;  Laterality: Right;  . CATARACT EXTRACTION W/PHACO Left 12/29/2013   Procedure: CATARACT EXTRACTION PHACO AND INTRAOCULAR LENS PLACEMENT (IOC);  Surgeon: [name], MD;  Location: [facility];  Service: Ophthalmology;  Laterality: Left;  . COLONOSCOPY    . EYE SURGERY    . HIP ARTHROPLASTY Bilateral   . JOINT REPLACEMENT    . OTHER SURGICAL HISTORY Right    carpal tunel injection  . SHOULDER SURGERY    . TONSILLECTOMY       Family History  Problem Relation Age of Onset  . Hypertension Mother   . Hyperlipidemia Mother   . Diabetes Sister   . Obesity Brother        gastric bypass 2018  . Thyroid disease Sister   . Obesity Sister      Social History   Socioeconomic History  . Marital status: Married    Spouse name: Not on file  . Number of children: Not on file  . Years of education: Not on file  . Highest education level: Not on file  Occupational History  . Not on file  Tobacco Use  . Smoking status: Never Smoker  . Smokeless tobacco: Never Used  Substance and Sexual Activity  . Alcohol use: No  . Drug use: No  . Sexual activity: Yes    Partners: Male  Other Topics Concern  . Not on file  Social History Narrative   Married. Education: [employer].    Social Determinants of Health   Financial Resource Strain:   . Difficulty of Paying Living Expenses: Not on file  Food Insecurity:   . Worried About Charity fundraiser in the Last Year: Not on file  . Ran Out of Food in the Last Year: Not on file  Transportation Needs:   . Lack of Transportation (Medical): Not on file  . Lack of Transportation (Non-Medical): Not on file  Physical Activity:   . Days of Exercise per Week: Not on file  . Minutes of Exercise per Session: Not on file  Stress:   . Feeling of Stress : Not on file  Social Connections:   . Frequency of Communication with Friends and Family: Not on file  . Frequency of Social Gatherings with Friends and Family: Not on file  . Attends  Religious Services: Not on file  . Active Member of Clubs or Organizations: Not on file  . Attends Archivist Meetings: Not on file  . Marital Status: Not on file  Intimate Partner Violence:   . Fear of Current or Ex-Partner: Not on file  . Emotionally Abused: Not on file  . Physically Abused: Not on file  . Sexually Abused: Not on file     Allergies  Allergen Reactions  . Other   . Meloxicam Other (See Comments)    Causes Bruising   . Pravastatin Sodium Other (See Comments)    Bruises  . Simvastatin Other (See Comments)    Bruises   . Tramadol Hcl Other (See Comments)    Bruising      Prior  to Admission medications   Medication Sig Start Date End Date Taking? Authorizing Provider  acetaminophen (TYLENOL) 650 MG CR tablet Take 650 mg by mouth every 8 (eight) hours as needed for pain.   Yes [provider]  Calcium Carbonate (CALCIUM 600 PO) Take by mouth.   Yes [provider]  Cholecalciferol (VITAMIN D) 50 MCG (2000 UT) tablet Take 2,000 Units by mouth daily.   Yes [provider]  Coenzyme Q10 (CO Q 10 PO) Take 75 mg by mouth 3 (three) times daily.   Yes [provider]  fluticasone (FLONASE) 50 MCG/ACT nasal spray SPRAY 2 SPRAYS INTO EACH NOSTRIL EVERY DAY 07/01/20  Yes Wendie Agreste, MD  gabapentin (NEURONTIN) 600 MG tablet Take 1 tablet (600 mg total) by mouth 2 (two) times daily. 12/27/19  Yes Wendie Agreste, MD  ibuprofen (ADVIL,MOTRIN) 600 MG tablet Take 1 tablet (600 mg total) by mouth every 8 (eight) hours as needed (with food). 10/21/17  Yes Wendie Agreste, MD  Misc Natural Products (CALCIUM PLUS ADVANCED PO) Take 600 mg by mouth 2 (two) times daily.    Yes [provider]  Multiple Vitamins-Minerals (CENTRUM SILVER PO) Take by mouth.   Yes [provider]  Omega-3 Fatty Acids (FISH OIL) 1200 MG CAPS Take 1,200 mg by mouth 3 (three) times daily.    Yes [provider]  pantoprazole  (PROTONIX) 40 MG tablet Take 1 tablet (40 mg total) by mouth daily. 01/16/17  Yes Wendie Agreste, MD  promethazine-dextromethorphan (PROMETHAZINE-DM) 6.25-15 MG/5ML syrup Take 5 mLs by mouth 4 (four) times daily as needed for cough. 10/17/20  Yes Sagardia, Ines Bloomer, MD  rosuvastatin (CRESTOR) 5 MG tablet TAKE 1 TABLET BY MOUTH DAILY AT 6 PM 12/30/19  Yes Wendie Agreste, MD  SYNTHROID 100 MCG tablet TAKE 1 TABLET BY MOUTH DAILY BEFORE BREAKFAST. 07/13/20  Yes Wendie Agreste, MD  vitamin E 400 UNIT capsule Take 400 Units by mouth daily.   Yes [provider]  azithromycin (ZITHROMAX) 250 MG tablet Sig as indicated Patient not taking: Reported on 11/09/2020 10/17/20   Horald Pollen, MD  benzonatate (TESSALON) 200 MG capsule Take 1 capsule (200 mg total) by mouth 2 (two) times daily as needed for cough. Patient not taking: Reported on 11/09/2020 10/17/20   Horald Pollen, MD  [facility] injection  08/18/19   [provider]     Depression screen [facility] 2/9 11/09/2020 07/10/2020 12/27/2019 08/18/2019 08/03/2019  Decreased Interest 0 0 0 0 0  Down, Depressed, Hopeless 0 0 0 0 0  PHQ - 2 Score 0 0 0 0 0     Fall Risk  11/09/2020 07/10/2020 12/27/2019 08/18/2019 08/03/2019  Falls in the past year? 0 0 - 0 0  Number falls in past yr: 0 0 0 0 0  Injury with Fall? 0 0 0 0 0  Risk for fall due to : - - - - -  Follow up Falls evaluation completed;Education provided Falls evaluation completed Falls evaluation completed Falls evaluation completed Falls evaluation completed;Education provided;Falls prevention discussed      PHYSICAL EXAM: BP 136/78 Comment: taken from a previous visit  Ht 5\' 8"  (1.727 m)   Wt 249 lb (112.9 kg)   BMI 37.86 kg/m    Wt Readings from Last 3 Encounters:  11/09/20 249 lb (112.9 kg)  07/10/20 249 lb (112.9 kg)  03/21/20 246 lb (111.6 kg)       Education/Counseling provided regarding  diet and exercise, prevention of chronic diseases,  smoking/tobacco cessation, if applicable, and reviewed "Covered Medicare Preventive Services."

## 2020-11-25 ENCOUNTER — Other Ambulatory Visit: Payer: Self-pay | Admitting: Family Medicine

## 2020-11-25 DIAGNOSIS — M792 Neuralgia and neuritis, unspecified: Secondary | ICD-10-CM

## 2020-11-25 NOTE — Telephone Encounter (Signed)
Requested Prescriptions  Pending Prescriptions Disp Refills  . gabapentin (NEURONTIN) 600 MG tablet [Pharmacy Med Name: GABAPENTIN 600 MG TABLET] 180 tablet 2    Sig: TAKE 1 TABLET BY MOUTH TWICE A DAY     Neurology: Anticonvulsants - gabapentin Passed - 11/25/2020  4:52 PM      Passed - Valid encounter within last 12 months    Recent Outpatient Visits          2 weeks ago Medicare annual wellness visit, subsequent   [facility], Ranell Patrick, MD   1 month ago Cough   [facility], MD   2 months ago Needs flu shot   [facility] [place], [place], MD   4 months ago Hyperlipidemia, unspecified hyperlipidemia type   [facility], MD   11 months ago Hypothyroidism, unspecified type   [facility], Ranell Patrick, MD

## 2020-12-24 ENCOUNTER — Other Ambulatory Visit: Payer: Self-pay | Admitting: Family Medicine

## 2020-12-24 DIAGNOSIS — E785 Hyperlipidemia, unspecified: Secondary | ICD-10-CM

## 2021-02-01 ENCOUNTER — Other Ambulatory Visit: Payer: Self-pay | Admitting: Family Medicine

## 2021-02-01 DIAGNOSIS — E039 Hypothyroidism, unspecified: Secondary | ICD-10-CM

## 2021-04-27 DIAGNOSIS — J069 Acute upper respiratory infection, unspecified: Secondary | ICD-10-CM | POA: Diagnosis not present

## 2021-05-31 ENCOUNTER — Encounter: Payer: Self-pay | Admitting: Family Medicine

## 2021-05-31 ENCOUNTER — Other Ambulatory Visit: Payer: Self-pay

## 2021-05-31 ENCOUNTER — Ambulatory Visit (INDEPENDENT_AMBULATORY_CARE_PROVIDER_SITE_OTHER): Payer: PPO | Admitting: Family Medicine

## 2021-05-31 VITALS — BP 132/76 | HR 75 | Temp 98.2°F | Resp 16 | Ht 68.0 in | Wt 251.8 lb

## 2021-05-31 DIAGNOSIS — E039 Hypothyroidism, unspecified: Secondary | ICD-10-CM

## 2021-05-31 DIAGNOSIS — Z6838 Body mass index (BMI) 38.0-38.9, adult: Secondary | ICD-10-CM | POA: Diagnosis not present

## 2021-05-31 DIAGNOSIS — Z131 Encounter for screening for diabetes mellitus: Secondary | ICD-10-CM | POA: Diagnosis not present

## 2021-05-31 DIAGNOSIS — E785 Hyperlipidemia, unspecified: Secondary | ICD-10-CM

## 2021-05-31 DIAGNOSIS — E669 Obesity, unspecified: Secondary | ICD-10-CM

## 2021-05-31 MED ORDER — ROSUVASTATIN CALCIUM 5 MG PO TABS: ORAL_TABLET | ORAL | 1 refills | Status: DC

## 2021-05-31 MED ORDER — SYNTHROID 100 MCG PO TABS
100.0000 ug | ORAL_TABLET | Freq: Every day | ORAL | 1 refills | Status: DC
Start: 2021-05-31 — End: 2022-07-22

## 2021-05-31 NOTE — Progress Notes (Signed)
Subjective:  Patient ID: Veronica Martinez, female    DOB: [birth year]  Age: 65 y.o. MRN: [id number]  CC:  Chief Complaint  Patient presents with   Hyperlipidemia    Pt in need of refills, here for general check in    Hypothyroidism    Pt needs level rechecked and medication refilled     HPI Veronica Martinez presents for   Hypothyroidism: Lab Results  Component Value Date   TSH 2.650 07/10/2020  Taking medication daily.  Synthroid 100 mcg daily No new hot or cold intolerance. No new hair or skin changes, heart palpitations or new fatigue. No new weight changes.  Wt Readings from Last 3 Encounters:  05/31/21 251 lb 12.8 oz (114.2 kg)  11/09/20 249 lb (112.9 kg)  07/10/20 249 lb (112.9 kg)     Hyperlipidemia Crestor 5 mg daily. No new myalgias.  Walking 5 miles per day.  Last ate over 6 hours ago.  Lab Results  Component Value Date   CHOL 186 07/10/2020   HDL 107 07/10/2020   LDLCALC 69 07/10/2020   TRIG 53 07/10/2020   CHOLHDL 1.7 07/10/2020   Lab Results  Component Value Date   ALT 20 07/10/2020   AST 24 07/10/2020   ALKPHOS 89 07/10/2020   BILITOT 0.3 07/10/2020   Meralgia paresthetica: No new side effects of meds. Gabapentin 600mg  BID. Working well.   Due for 2nd covid booster. Plans on getting soon.   Obesity: Walking for weight loss. Does not want to consider surgical options but ok with number for medical weight loss specialist.  Wt Readings from Last 3 Encounters:  05/31/21 251 lb 12.8 oz (114.2 kg)  11/09/20 249 lb (112.9 kg)  07/10/20 249 lb (112.9 kg)   Body mass index is 38.29 kg/m. 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 08/10/2013   Right hip pain 08/09/2013   Trigger finger, acquired 04/22/2013   Carpal tunnel syndrome of right wrist 04/22/2013   Pain of  right heel 12/08/2012   Myalgia 11/10/2012   OBESITY, UNSPECIFIED 02/20/2011   BACK PAIN, LUMBAR 07/05/2010   ABNORMALITY OF GAIT 05/02/2010   MERALGIA PARESTHETICA 03/02/2010   HIP PAIN, BILATERAL 03/02/2010   Past Medical History:  Diagnosis Date   Allergy    GERD (gastroesophageal reflux disease)    Headache(784.0)    sinus   High cholesterol    History of Graves' disease    Hx of radioactive iodine thyroid ablation    Hyperlipidemia    Phreesia 11/08/2020   Hypothyroidism    Sickle cell anemia ([place])    Phreesia 11/08/2020   Past Surgical History:  Procedure Laterality Date   ABDOMINAL HYSTERECTOMY     APPENDECTOMY     CATARACT EXTRACTION W/PHACO Right 09/22/2013   Procedure: CATARACT EXTRACTION PHACO AND INTRAOCULAR LENS PLACEMENT ([place]);  Surgeon: [name], MD;  Location: [facility];  Service: Ophthalmology;  Laterality: Right;   CATARACT EXTRACTION W/PHACO Left 12/29/2013   Procedure: CATARACT EXTRACTION PHACO AND INTRAOCULAR LENS PLACEMENT (IOC);  Surgeon: [name], MD;  Location: [facility];  Service: Ophthalmology;  Laterality: Left;   COLONOSCOPY     EYE SURGERY     HIP ARTHROPLASTY Bilateral    JOINT REPLACEMENT     OTHER SURGICAL HISTORY Right    carpal  tunel injection   SHOULDER SURGERY     TONSILLECTOMY     Allergies  Allergen Reactions   Other    Meloxicam Other (See Comments)    Causes Bruising    Pravastatin Sodium Other (See Comments)    Bruises   Simvastatin Other (See Comments)    Bruises    Tramadol Hcl Other (See Comments)    Bruising    Prior to Admission medications   Medication Sig Start Date End Date Taking? Authorizing Provider  acetaminophen (TYLENOL) 650 MG CR tablet Take 650 mg by mouth every 8 (eight) hours as needed for pain.   Yes [provider]  Calcium Carbonate (CALCIUM 600 PO) Take by mouth.   Yes [provider]  Cholecalciferol (VITAMIN D) 50 MCG (2000 UT) tablet Take 2,000 Units by mouth daily.   Yes  [provider]  Coenzyme Q10 (CO Q 10 PO) Take 75 mg by mouth 3 (three) times daily.   Yes [provider]  fluticasone (FLONASE) 50 MCG/ACT nasal spray SPRAY 2 SPRAYS INTO EACH NOSTRIL EVERY DAY 07/01/20  Yes Wendie Agreste, MD  gabapentin (NEURONTIN) 600 MG tablet TAKE 1 TABLET BY MOUTH TWICE A DAY 11/25/20  Yes Wendie Agreste, MD  ibuprofen (ADVIL,MOTRIN) 600 MG tablet Take 1 tablet (600 mg total) by mouth every 8 (eight) hours as needed (with food). 10/21/17  Yes Wendie Agreste, MD  Misc Natural Products (CALCIUM PLUS ADVANCED PO) Take 600 mg by mouth 2 (two) times daily.    Yes [provider]  Multiple Vitamins-Minerals (CENTRUM SILVER PO) Take by mouth.   Yes [provider]  Omega-3 Fatty Acids (FISH OIL) 1200 MG CAPS Take 1,200 mg by mouth 3 (three) times daily.    Yes [provider]  rosuvastatin (CRESTOR) 5 MG tablet TAKE 1 TABLET BY MOUTH EVERY DAY AT [facility] 12/25/20  Yes Wendie Agreste, MD  SYNTHROID 100 MCG tablet TAKE 1 TABLET BY MOUTH DAILY BEFORE BREAKFAST. 02/01/21  Yes Wendie Agreste, MD  vitamin E 400 UNIT capsule Take 400 Units by mouth daily.   Yes [provider]  promethazine-dextromethorphan (PROMETHAZINE-DM) 6.25-15 MG/5ML syrup Take 5 mLs by mouth 4 (four) times daily as needed for cough. 10/17/20   Horald Pollen, MD   Social History   Socioeconomic History   Marital status: Married    Spouse name: Not on file   Number of children: Not on file   Years of education: Not on file   Highest education level: Not on file  Occupational History   Not on file  Tobacco Use   Smoking status: Never   Smokeless tobacco: Never  Substance and Sexual Activity   Alcohol use: No   Drug use: No   Sexual activity: Yes    Partners: Male  Other Topics Concern   Not on file  Social History Narrative   Married. Education: [employer].    Social Determinants of Health   Financial Resource Strain: Not on  file  Food Insecurity: Not on file  Transportation Needs: Not on file  Physical Activity: Not on file  Stress: Not on file  Social Connections: Not on file  Intimate Partner Violence: Not on file   Review of Systems   Objective:   Vitals:   05/31/21 1346  BP: 132/76  Pulse: 75  Resp: 16  Temp: 98.2 F (36.8 C)  TempSrc: Temporal  SpO2: 97%  Weight: 251 lb 12.8 oz (114.2 kg)  Height: 5\' 8"  (1.727 m)     Physical Exam Vitals reviewed.  Constitutional:      Appearance: She is well-developed. She is obese.  HENT:     Head: Normocephalic and atraumatic.  Eyes:     Conjunctiva/sclera: Conjunctivae normal.     Pupils: Pupils are equal, round, and reactive to light.  Neck:     Vascular: No carotid bruit.  Cardiovascular:     Rate and Rhythm: Normal rate and regular rhythm.     Heart sounds: Normal heart sounds.  Pulmonary:     Effort: Pulmonary effort is normal.     Breath sounds: Normal breath sounds.  Abdominal:     Palpations: Abdomen is soft. There is no pulsatile mass.     Tenderness: There is no abdominal tenderness.  Skin:    General: Skin is warm and dry.  Neurological:     Mental Status: She is alert and oriented to person, place, and time.  Psychiatric:        Behavior: Behavior normal.    Assessment & Plan:  Everli Rother is a 65 y.o. female . Hyperlipidemia, unspecified hyperlipidemia type - Plan: rosuvastatin (CRESTOR) 5 MG tablet, Comprehensive metabolic panel, Lipid panel  -  Stable, tolerating current regimen. Medications refilled. Labs pending as above.   Hypothyroidism, unspecified type - Plan: SYNTHROID 100 MCG tablet, TSH  -  Stable, tolerating current regimen. Medications refilled. Labs pending as above.   Class 2 obesity with body mass index (BMI) of 38.0 to 38.9 in adult, unspecified obesity type, unspecified whether serious comorbidity present - Plan: Hemoglobin A1c Screening for diabetes mellitus - Plan: Hemoglobin  A1c  -Commended on walking, declined surgical options at this time.  Phone number provided for medical weight loss management.  Check A1c  Meds ordered this encounter  Medications   rosuvastatin (CRESTOR) 5 MG tablet    Sig: TAKE 1 TABLET BY MOUTH EVERY DAY AT 6PM    Dispense:  90 tablet    Refill:  1   SYNTHROID 100 MCG tablet    Sig: Take 1 tablet (100 mcg total) by mouth daily before breakfast.    Dispense:  90 tablet    Refill:  1    DX Code Needed  .    Patient Instructions  No med changes for now. If any concerns on labs, I will let you know. Thanks for coming in today.   Here is info for weight management if needed: [facility]  [phone]    Signed, [name], MD [facility]

## 2021-05-31 NOTE — Patient Instructions (Signed)
No med changes for now. If any concerns on labs, I will let you know. Thanks for coming in today.   Here is info for weight management if needed: [facility] Weight Loss [facility]  [phone]

## 2021-06-01 LAB — COMPREHENSIVE METABOLIC PANEL
ALT: 25 U/L (ref 0–35)
AST: 27 U/L (ref 0–37)
Albumin: 4.8 g/dL (ref 3.5–5.2)
Alkaline Phosphatase: 77 U/L (ref 39–117)
BUN: 11 mg/dL (ref 6–23)
CO2: 29 mEq/L (ref 19–32)
Calcium: 10.4 mg/dL (ref 8.4–10.5)
Chloride: 105 mEq/L (ref 96–112)
Creatinine, Ser: 0.8 mg/dL (ref 0.40–1.20)
GFR: 76.4 mL/min (ref >60.00)
Glucose, Bld: 73 mg/dL (ref 70–99)
Potassium: 5 mEq/L (ref 3.5–5.1)
Sodium: 143 mEq/L (ref 135–145)
Total Bilirubin: 0.5 mg/dL (ref 0.2–1.2)
Total Protein: 7.6 g/dL (ref 6.0–8.3)

## 2021-06-01 LAB — LIPID PANEL
Cholesterol: 165 mg/dL (ref 0–200)
HDL: 92.9 mg/dL (ref >39.00)
LDL Cholesterol: 64 mg/dL (ref 0–99)
NonHDL: 72.25
Total CHOL/HDL Ratio: 2
Triglycerides: 41 mg/dL (ref 0.0–149.0)
VLDL: 8.2 mg/dL (ref 0.0–40.0)

## 2021-06-01 LAB — HEMOGLOBIN A1C: Hgb A1c MFr Bld: 5.9 % (ref 4.6–6.5)

## 2021-06-01 LAB — TSH: TSH: 2.95 u[IU]/mL (ref 0.35–4.50)

## 2021-08-20 ENCOUNTER — Other Ambulatory Visit: Payer: Self-pay | Admitting: Family Medicine

## 2021-08-20 DIAGNOSIS — M792 Neuralgia and neuritis, unspecified: Secondary | ICD-10-CM

## 2021-08-23 ENCOUNTER — Other Ambulatory Visit: Payer: Self-pay | Admitting: Family Medicine

## 2021-08-23 DIAGNOSIS — Z1231 Encounter for screening mammogram for malignant neoplasm of breast: Secondary | ICD-10-CM

## 2021-08-29 ENCOUNTER — Other Ambulatory Visit: Payer: Self-pay | Admitting: Family Medicine

## 2021-08-29 DIAGNOSIS — E039 Hypothyroidism, unspecified: Secondary | ICD-10-CM

## 2021-09-06 ENCOUNTER — Other Ambulatory Visit: Payer: Self-pay | Admitting: Family Medicine

## 2021-09-22 DIAGNOSIS — E039 Hypothyroidism, unspecified: Secondary | ICD-10-CM | POA: Insufficient documentation

## 2021-09-22 DIAGNOSIS — E785 Hyperlipidemia, unspecified: Secondary | ICD-10-CM | POA: Insufficient documentation

## 2021-09-24 DIAGNOSIS — Z6841 Body Mass Index (BMI) 40.0 and over, adult: Secondary | ICD-10-CM | POA: Diagnosis not present

## 2021-09-24 DIAGNOSIS — R102 Pelvic and perineal pain: Secondary | ICD-10-CM | POA: Diagnosis not present

## 2021-09-24 DIAGNOSIS — N952 Postmenopausal atrophic vaginitis: Secondary | ICD-10-CM | POA: Diagnosis not present

## 2021-09-24 DIAGNOSIS — Z01419 Encounter for gynecological examination (general) (routine) without abnormal findings: Secondary | ICD-10-CM | POA: Diagnosis not present

## 2021-10-08 ENCOUNTER — Other Ambulatory Visit: Payer: Self-pay

## 2021-10-08 ENCOUNTER — Ambulatory Visit
Admission: RE | Admit: 2021-10-08 | Discharge: 2021-10-08 | Disposition: A | Discharge status: Home / Self Care | Payer: PPO | Source: Ambulatory Visit | Attending: Family Medicine | Admitting: Family Medicine

## 2021-10-08 DIAGNOSIS — Z1231 Encounter for screening mammogram for malignant neoplasm of breast: Secondary | ICD-10-CM | POA: Diagnosis not present

## 2021-11-02 DIAGNOSIS — H10413 Chronic giant papillary conjunctivitis, bilateral: Secondary | ICD-10-CM | POA: Diagnosis not present

## 2021-11-02 DIAGNOSIS — H43813 Vitreous degeneration, bilateral: Secondary | ICD-10-CM | POA: Diagnosis not present

## 2021-11-02 DIAGNOSIS — H0102A Squamous blepharitis right eye, upper and lower eyelids: Secondary | ICD-10-CM | POA: Diagnosis not present

## 2021-11-02 DIAGNOSIS — H26491 Other secondary cataract, right eye: Secondary | ICD-10-CM | POA: Diagnosis not present

## 2021-11-02 DIAGNOSIS — H0102B Squamous blepharitis left eye, upper and lower eyelids: Secondary | ICD-10-CM | POA: Diagnosis not present

## 2021-11-02 DIAGNOSIS — Z961 Presence of intraocular lens: Secondary | ICD-10-CM | POA: Diagnosis not present

## 2021-11-02 DIAGNOSIS — H35372 Puckering of macula, left eye: Secondary | ICD-10-CM | POA: Diagnosis not present

## 2021-11-02 DIAGNOSIS — H0264 Xanthelasma of left upper eyelid: Secondary | ICD-10-CM | POA: Diagnosis not present

## 2021-11-02 DIAGNOSIS — H04123 Dry eye syndrome of bilateral lacrimal glands: Secondary | ICD-10-CM | POA: Diagnosis not present

## 2021-11-29 ENCOUNTER — Other Ambulatory Visit: Payer: Self-pay | Admitting: Family Medicine

## 2021-11-29 DIAGNOSIS — E785 Hyperlipidemia, unspecified: Secondary | ICD-10-CM

## 2021-12-25 DIAGNOSIS — R1030 Lower abdominal pain, unspecified: Secondary | ICD-10-CM | POA: Diagnosis not present

## 2021-12-25 DIAGNOSIS — N941 Unspecified dyspareunia: Secondary | ICD-10-CM | POA: Diagnosis not present

## 2022-01-24 ENCOUNTER — Ambulatory Visit: Payer: PPO

## 2022-01-31 DIAGNOSIS — J309 Allergic rhinitis, unspecified: Secondary | ICD-10-CM | POA: Diagnosis not present

## 2022-01-31 DIAGNOSIS — E039 Hypothyroidism, unspecified: Secondary | ICD-10-CM | POA: Diagnosis not present

## 2022-01-31 DIAGNOSIS — E559 Vitamin D deficiency, unspecified: Secondary | ICD-10-CM | POA: Diagnosis not present

## 2022-01-31 DIAGNOSIS — G8929 Other chronic pain: Secondary | ICD-10-CM | POA: Diagnosis not present

## 2022-01-31 DIAGNOSIS — M1990 Unspecified osteoarthritis, unspecified site: Secondary | ICD-10-CM | POA: Diagnosis not present

## 2022-01-31 DIAGNOSIS — K219 Gastro-esophageal reflux disease without esophagitis: Secondary | ICD-10-CM | POA: Diagnosis not present

## 2022-01-31 DIAGNOSIS — E669 Obesity, unspecified: Secondary | ICD-10-CM | POA: Diagnosis not present

## 2022-01-31 DIAGNOSIS — E785 Hyperlipidemia, unspecified: Secondary | ICD-10-CM | POA: Diagnosis not present

## 2022-02-07 ENCOUNTER — Ambulatory Visit: Payer: PPO

## 2022-02-07 DIAGNOSIS — K2100 Gastro-esophageal reflux disease with esophagitis, without bleeding: Secondary | ICD-10-CM | POA: Insufficient documentation

## 2022-02-07 DIAGNOSIS — R110 Nausea: Secondary | ICD-10-CM | POA: Insufficient documentation

## 2022-02-07 DIAGNOSIS — Z79899 Other long term (current) drug therapy: Secondary | ICD-10-CM | POA: Insufficient documentation

## 2022-02-07 DIAGNOSIS — K219 Gastro-esophageal reflux disease without esophagitis: Secondary | ICD-10-CM | POA: Insufficient documentation

## 2022-02-07 DIAGNOSIS — R1013 Epigastric pain: Secondary | ICD-10-CM | POA: Insufficient documentation

## 2022-02-14 ENCOUNTER — Ambulatory Visit (INDEPENDENT_AMBULATORY_CARE_PROVIDER_SITE_OTHER): Payer: PPO

## 2022-02-14 DIAGNOSIS — Z0000 Encounter for general adult medical examination without abnormal findings: Secondary | ICD-10-CM

## 2022-02-14 NOTE — Progress Notes (Signed)
Subjective:   Veronica Martinez is a 66 y.o. female who presents for an Subsequent  Medicare Annual Wellness Visit.  I connected with Veronica Martinez today by telephone and verified that I am speaking with the correct person using two identifiers. Location patient: home Location provider: work Persons participating in the virtual visit: patient, provider.   I discussed the limitations, risks, security and privacy concerns of performing an evaluation and management service by telephone and the availability of in person appointments. I also discussed with the patient that there may be a patient responsible charge related to this service. The patient expressed understanding and verbally consented to this telephonic visit.    Interactive audio and video telecommunications were attempted between this provider and patient, however failed, due to patient having technical difficulties OR patient did not have access to video capability.  We continued and completed visit with audio only.    Review of Systems     Cardiac Risk Factors include: advanced age (>17men, >46 women)     Objective:    Today's Vitals   There is no height or weight on file to calculate BMI.  Advanced Directives 02/14/2022 11/09/2020 03/21/2020 08/18/2019 08/03/2019 08/21/2017 04/04/2016  Does Patient Have a Medical Advance Directive? No No No No No No No  Would patient like information on creating a medical advance directive? No - Patient declined Yes (ED - Information included in AVS) No - Patient declined Yes (MAU/Ambulatory/Procedural Areas - Information given) No - Patient declined Yes (MAU/Ambulatory/Procedural Areas - Information given) No - patient declined information  Pre-existing out of facility DNR order (yellow form or pink MOST form) - - - - - - -    Current Medications (verified) Outpatient Encounter Medications as of 02/14/2022  Medication Sig   acetaminophen (TYLENOL) 650 MG CR tablet Take 650 mg by mouth  every 8 (eight) hours as needed for pain.   Calcium Carbonate (CALCIUM 600 PO) Take by mouth.   cetirizine (ZYRTEC) 10 MG tablet 1 tablet   Cholecalciferol (VITAMIN D) 50 MCG (2000 UT) tablet Take 2,000 Units by mouth daily.   Coenzyme Q10 (CO Q 10 PO) Take 75 mg by mouth 3 (three) times daily.   fluticasone (FLONASE) 50 MCG/ACT nasal spray SPRAY 2 SPRAYS INTO EACH NOSTRIL EVERY DAY   gabapentin (NEURONTIN) 600 MG tablet TAKE 1 TABLET BY MOUTH TWICE A DAY   guaiFENesin (MUCINEX) 600 MG 12 hr tablet 1 tablet as needed   ibuprofen (ADVIL,MOTRIN) 600 MG tablet Take 1 tablet (600 mg total) by mouth every 8 (eight) hours as needed (with food).   Misc Natural Products (CALCIUM PLUS ADVANCED PO) Take 600 mg by mouth 2 (two) times daily.    Multiple Vitamins-Minerals (CENTRUM SILVER PO) Take by mouth.   Omega-3 Fatty Acids (FISH OIL) 1200 MG CAPS Take 1,200 mg by mouth 3 (three) times daily.    pantoprazole (PROTONIX) 40 MG tablet Take 40 mg by mouth every morning.   RA Vitamin E-Vit A & D 20000 units CREA 1 tablet with a meal   rosuvastatin (CRESTOR) 5 MG tablet TAKE 1 TABLET BY MOUTH EVERY DAY AT 6PM   SYNTHROID 100 MCG tablet Take 1 tablet (100 mcg total) by mouth daily before breakfast.   vitamin E 400 UNIT capsule Take 400 Units by mouth daily.   Cyanocobalamin 2000 MCG/ML SOLN  (Patient not taking: Reported on 02/14/2022)   Fluticasone Furoate 50 MCG/ACT AEPB 50 mcg 1 puff in each nostril (Patient not taking:  Reported on 02/14/2022)   No facility-administered encounter medications on file as of 02/14/2022.    Allergies (verified) Other, Meloxicam, Pravastatin sodium, Simvastatin, Tramadol, Tramadol hcl, and Tramadol hcl   History: Past Medical History:  Diagnosis Date   Allergy    GERD (gastroesophageal reflux disease)    Headache(784.0)    sinus   High cholesterol    History of Graves' disease    Hx of radioactive iodine thyroid ablation    Hyperlipidemia    Phreesia 11/08/2020    Hypothyroidism    Sickle cell anemia ([place])    Phreesia 11/08/2020   Past Surgical History:  Procedure Laterality Date   ABDOMINAL HYSTERECTOMY     APPENDECTOMY     CATARACT EXTRACTION W/PHACO Right 09/22/2013   Procedure: CATARACT EXTRACTION PHACO AND INTRAOCULAR LENS PLACEMENT ([place]);  Surgeon: [name], MD;  Location: [facility];  Service: Ophthalmology;  Laterality: Right;   CATARACT EXTRACTION W/PHACO Left 12/29/2013   Procedure: CATARACT EXTRACTION PHACO AND INTRAOCULAR LENS PLACEMENT ([facility]);  Surgeon: [name], MD;  Location: [facility];  Service: Ophthalmology;  Laterality: Left;   COLONOSCOPY     EYE SURGERY     HIP ARTHROPLASTY Bilateral    JOINT REPLACEMENT     OTHER SURGICAL HISTORY Right    carpal tunel injection   SHOULDER SURGERY     TONSILLECTOMY     Family History  Problem Relation Age of Onset   Hypertension Mother    Hyperlipidemia Mother    Diabetes Sister    Obesity Brother        gastric bypass 2018   Thyroid disease Sister    Obesity Sister    Social History   Socioeconomic History   Marital status: Married    Spouse name: Not on file   Number of children: Not on file   Years of education: Not on file   Highest education level: Not on file  Occupational History   Not on file  Tobacco Use   Smoking status: Never   Smokeless tobacco: Never  Substance and Sexual Activity   Alcohol use: No   Drug use: No   Sexual activity: Yes    Partners: Male  Other Topics Concern   Not on file  Social History Narrative   Married. Education: [employer].    Social Determinants of Health   Financial Resource Strain: Low Risk    Difficulty of Paying Living Expenses: Not hard at all  Food Insecurity: No Food Insecurity   Worried About Charity fundraiser in the Last Year: Never true   [place] in the Last Year: Never true  Transportation Needs: No Transportation Needs   Lack of Transportation (Medical): No   Lack of Transportation (Non-Medical): No   Physical Activity: Sufficiently Active   Days of Exercise per Week: 4 days   Minutes of Exercise per Session: 60 min  Stress: No Stress Concern Present   Feeling of Stress : Not at all  Social Connections: Socially Integrated   Frequency of Communication with Friends and Family: Three times a week   Frequency of Social Gatherings with Friends and Family: Three times a week   Attends Religious Services: More than 4 times per year   Active Member of Clubs or Organizations: Yes   Attends Archivist Meetings: More than 4 times per year   Marital Status: Married    Tobacco Counseling Counseling given: Not Answered   Clinical Intake:  Pre-visit preparation completed: Yes  Pain : No/denies pain     Nutritional Risks: None Diabetes: No  How often do you need to have someone help you when you read instructions, pamphlets, or other written materials from your doctor or pharmacy?: 1 - Never What is the last grade level you completed in school?: high school  Diabetic?no   Interpreter Needed?: No  Information entered by :: L.Ariq Khamis,LPN   Activities of Daily Living In your present state of health, do you have any difficulty performing the following activities: 02/14/2022  Hearing? N  Vision? N  Difficulty concentrating or making decisions? N  Walking or climbing stairs? N  Dressing or bathing? N  Doing errands, shopping? N  Preparing Food and eating ? N  Using the Toilet? N  In the past six months, have you accidently leaked urine? N  Do you have problems with loss of bowel control? N  Managing your Medications? N  Managing your Finances? N  Housekeeping or managing your Housekeeping? N  Some recent data might be hidden    Patient Care Team: Wendie Agreste, MD as PCP - General (Family Medicine) Warden Fillers, MD as Consulting Physician (Ophthalmology) Ronald Lobo, MD as Consulting Physician (Gastroenterology)  Indicate any recent Medical Services  you may have received from other than [facility] providers in the past year (date may be approximate).     Assessment:   This is a routine wellness examination for [place].  Hearing/Vision screen Vision Screening - Comments:: Annual eye exams wear glasses   Dietary issues and exercise activities discussed: Current Exercise Habits: Home exercise routine, Type of exercise: walking, Time (Minutes): 60, Frequency (Times/Week): 4, Weekly Exercise (Minutes/Week): 240, Intensity: Mild, Exercise limited by: None identified   Goals Addressed   None    Depression Screen PHQ 2/9 Scores 02/14/2022 02/14/2022 05/31/2021 11/09/2020 07/10/2020 12/27/2019 08/18/2019  PHQ - 2 Score 0 0 0 0 0 0 0  PHQ- 9 Score - - 0 - - - -  Exception Documentation - - - - - - -    Fall Risk Fall Risk  02/14/2022 05/31/2021 11/09/2020 07/10/2020 12/27/2019  Falls in the past year? 0 0 0 0 -  Number falls in past yr: 0 - 0 0 0  Injury with Fall? 0 - 0 0 0  Risk for fall due to : No Fall Risks - - - -  Follow up Falls evaluation completed Falls evaluation completed Falls evaluation completed;Education provided Falls evaluation completed Falls evaluation completed    [place]:  Any stairs in or around the home? Yes  If so, are there any without handrails? No  Home free of loose throw rugs in walkways, pet beds, electrical cords, etc? Yes  Adequate lighting in your home to reduce risk of falls? Yes   ASSISTIVE DEVICES UTILIZED TO PREVENT FALLS:  Life alert? No  Use of a cane, walker or w/c? No  Grab bars in the bathroom? Yes  Shower chair or bench in shower? Yes  Elevated toilet seat or a handicapped toilet? Yes    Cognitive Function:  Normal cognitive status assessed by direct observation by this Nurse Health Advisor. No abnormalities found.     6CIT Screen 11/09/2020 11/09/2020 08/18/2019 08/03/2019 08/21/2017  What Year? 0 points 0 points 0 points 0 points 0 points  What month? 0 points 0  points 0 points 0 points 0 points  What time? - 0 points 0 points 0 points 0 points  Count back from 20 -  0 points 0 points 0 points 0 points  Months in reverse - 2 points 0 points 0 points 0 points  Repeat phrase - 0 points 0 points 0 points 6 points  Total Score - 2 0 0 6    Immunizations Immunization History  Administered Date(s) Administered   Fluad Quad(high Dose 65+) 08/18/2019, 09/21/2020   Influenza Split 09/01/2012   Influenza,inj,Quad PF,6+ Mos 09/07/2013, 01/10/2015, 12/26/2015, 08/21/2017, 02/15/2019   PFIZER(Purple Top)SARS-COV-2 Vaccination 02/05/2020, 03/01/2020, 09/25/2020   Pneumococcal Conjugate-13 08/18/2019   Tdap 01/10/2015   Zoster Recombinat (Shingrix) 08/18/2019, 06/22/2020    TDAP status: Up to date  Flu Vaccine status: Up to date  Pneumococcal vaccine status: Up to date  Covid-19 vaccine status: Completed vaccines  Qualifies for Shingles Vaccine? Yes   Zostavax completed Yes   Shingrix Completed?: Yes  Screening Tests Health Maintenance  Topic Date Due   Pneumonia Vaccine 62+ Years old (2 - PPSV23 if available, else PCV20) 08/17/2020   COVID-19 Vaccine (4 - Booster for Pfizer series) 11/20/2020   INFLUENZA VACCINE  07/23/2021   MAMMOGRAM  10/08/2022   TETANUS/TDAP  01/10/2025   COLONOSCOPY (Pts 45-53yrs Insurance coverage will need to be confirmed)  10/25/2026   DEXA SCAN  Completed   Hepatitis C Screening  Completed   Zoster Vaccines- Shingrix  Completed   HPV VACCINES  Aged Out    Health Maintenance  Health Maintenance Due  Topic Date Due   Pneumonia Vaccine 21+ Years old (2 - PPSV23 if available, else PCV20) 08/17/2020   COVID-19 Vaccine (4 - Booster for [place] series) 11/20/2020   INFLUENZA VACCINE  07/23/2021    Colorectal cancer screening: Type of screening: Colonoscopy. Completed 10/25/2016. Repeat every 10 years  Mammogram status: Completed 10/08/2021. Repeat every year  Bone Density status: Completed 10/18/2019. Results  reflect: Bone density results: OSTEOPENIA. Repeat every 5 years.  Lung Cancer Screening: (Low Dose CT Chest recommended if Age 65-80 years, 30 pack-year currently smoking OR have quit w/in 15years.) does not qualify.   Lung Cancer Screening Referral: n/a  Additional Screening:  Hepatitis C Screening: does not qualify; Completed 01/16/2017  Vision Screening: Recommended annual ophthalmology exams for early detection of glaucoma and other disorders of the eye. Is the patient up to date with their annual eye exam?  Yes  Who is the provider or what is the name of the office in which the patient attends annual eye exams? Dr.Groat  If pt is not established with a provider, would they like to be referred to a provider to establish care? No .   Dental Screening: Recommended annual dental exams for proper oral hygiene  Community Resource Referral / Chronic Care Management: CRR required this visit?  No   CCM required this visit?  No      Plan:     I have personally reviewed and noted the following in the patients chart:   Medical and social history Use of alcohol, tobacco or illicit drugs  Current medications and supplements including opioid prescriptions. Patient is not currently taking opioid prescriptions. Functional ability and status Nutritional status Physical activity Advanced directives List of other physicians Hospitalizations, surgeries, and ER visits in previous 12 months Vitals Screenings to include cognitive, depression, and falls Referrals and appointments  In addition, I have reviewed and discussed with patient certain preventive protocols, quality metrics, and best practice recommendations. A written personalized care plan for preventive services as well as general preventive health recommendations were provided to patient.  Randel Pigg, LPN   [id number]/03/[id number]   Nurse Notes: none

## 2022-02-14 NOTE — Patient Instructions (Signed)
Veronica Martinez , Thank you for taking time to come for your Medicare Wellness Visit. I appreciate your ongoing commitment to your health goals. Please review the following plan we discussed and let me know if I can assist you in the future.   Screening recommendations/referrals: Colonoscopy: 10/25/2016  due 2027 Mammogram: 10/08/2021 Bone Density: 10/18/2019 Recommended yearly ophthalmology/optometry visit for glaucoma screening and checkup Recommended yearly dental visit for hygiene and checkup  Vaccinations: Influenza vaccine: completed  Pneumococcal vaccine: completed  Tdap vaccine: 01/10/2015 Shingles vaccine: completed     Advanced directives: no   Conditions/risks identified: none   Next appointment: none    Preventive Care 68 Years and Older, Female Preventive care refers to lifestyle choices and visits with your health care provider that can promote health and wellness. What does preventive care include? A yearly physical exam. This is also called an annual well check. Dental exams once or twice a year. Routine eye exams. Ask your health care provider how often you should have your eyes checked. Personal lifestyle choices, including: Daily care of your teeth and gums. Regular physical activity. Eating a healthy diet. Avoiding tobacco and drug use. Limiting alcohol use. Practicing safe sex. Taking low-dose aspirin every day. Taking vitamin and mineral supplements as recommended by your health care provider. What happens during an annual well check? The services and screenings done by your health care provider during your annual well check will depend on your age, overall health, lifestyle risk factors, and family history of disease. Counseling  Your health care provider may ask you questions about your: Alcohol use. Tobacco use. Drug use. Emotional well-being. Home and relationship well-being. Sexual activity. Eating habits. History of falls. Memory and ability to  understand (cognition). Work and work Statistician. Reproductive health. Screening  You may have the following tests or measurements: Height, weight, and BMI. Blood pressure. Lipid and cholesterol levels. These may be checked every 5 years, or more frequently if you are over 55 years old. Skin check. Lung cancer screening. You may have this screening every year starting at age 68 if you have a 30-pack-year history of smoking and currently smoke or have quit within the past 15 years. Fecal occult blood test (FOBT) of the stool. You may have this test every year starting at age 68. Flexible sigmoidoscopy or colonoscopy. You may have a sigmoidoscopy every 5 years or a colonoscopy every 10 years starting at age 75. Hepatitis C blood test. Hepatitis B blood test. Sexually transmitted disease (STD) testing. Diabetes screening. This is done by checking your blood sugar (glucose) after you have not eaten for a while (fasting). You may have this done every 1-3 years. Bone density scan. This is done to screen for osteoporosis. You may have this done starting at age 68. Mammogram. This may be done every 1-2 years. Talk to your health care provider about how often you should have regular mammograms. Talk with your health care provider about your test results, treatment options, and if necessary, the need for more tests. Vaccines  Your health care provider may recommend certain vaccines, such as: Influenza vaccine. This is recommended every year. Tetanus, diphtheria, and acellular pertussis (Tdap, Td) vaccine. You may need a Td booster every 10 years. Zoster vaccine. You may need this after age 68. Pneumococcal 13-valent conjugate (PCV13) vaccine. One dose is recommended after age 68. Pneumococcal polysaccharide (PPSV23) vaccine. One dose is recommended after age 68. Talk to your health care provider about which screenings and vaccines you need and  how often you need them. This information is not  intended to replace advice given to you by your health care provider. Make sure you discuss any questions you have with your health care provider. Document Released: 01/05/2016 Document Revised: 08/28/2016 Document Reviewed: 10/10/2015 Elsevier Interactive Patient Education  2017 [place] Prevention in the Home Falls can cause injuries. They can happen to people of all ages. There are many things you can do to make your home safe and to help prevent falls. What can I do on the outside of my home? Regularly fix the edges of walkways and driveways and fix any cracks. Remove anything that might make you trip as you walk through a door, such as a raised step or threshold. Trim any bushes or trees on the path to your home. Use bright outdoor lighting. Clear any walking paths of anything that might make someone trip, such as rocks or tools. Regularly check to see if handrails are loose or broken. Make sure that both sides of any steps have handrails. Any raised decks and porches should have guardrails on the edges. Have any leaves, snow, or ice cleared regularly. Use sand or salt on walking paths during winter. Clean up any spills in your garage right away. This includes oil or grease spills. What can I do in the bathroom? Use night lights. Install grab bars by the toilet and in the tub and shower. Do not use towel bars as grab bars. Use non-skid mats or decals in the tub or shower. If you need to sit down in the shower, use a plastic, non-slip stool. Keep the floor dry. Clean up any water that spills on the floor as soon as it happens. Remove soap buildup in the tub or shower regularly. Attach bath mats securely with double-sided non-slip rug tape. Do not have throw rugs and other things on the floor that can make you trip. What can I do in the bedroom? Use night lights. Make sure that you have a light by your bed that is easy to reach. Do not use any sheets or blankets that are  too big for your bed. They should not hang down onto the floor. Have a firm chair that has side arms. You can use this for support while you get dressed. Do not have throw rugs and other things on the floor that can make you trip. What can I do in the kitchen? Clean up any spills right away. Avoid walking on wet floors. Keep items that you use a lot in easy-to-reach places. If you need to reach something above you, use a strong step stool that has a grab bar. Keep electrical cords out of the way. Do not use floor polish or wax that makes floors slippery. If you must use wax, use non-skid floor wax. Do not have throw rugs and other things on the floor that can make you trip. What can I do with my stairs? Do not leave any items on the stairs. Make sure that there are handrails on both sides of the stairs and use them. Fix handrails that are broken or loose. Make sure that handrails are as long as the stairways. Check any carpeting to make sure that it is firmly attached to the stairs. Fix any carpet that is loose or worn. Avoid having throw rugs at the top or bottom of the stairs. If you do have throw rugs, attach them to the floor with carpet tape. Make sure that you have  a light switch at the top of the stairs and the bottom of the stairs. If you do not have them, ask someone to add them for you. What else can I do to help prevent falls? Wear shoes that: Do not have high heels. Have rubber bottoms. Are comfortable and fit you well. Are closed at the toe. Do not wear sandals. If you use a stepladder: Make sure that it is fully opened. Do not climb a closed stepladder. Make sure that both sides of the stepladder are locked into place. Ask someone to hold it for you, if possible. Clearly mark and make sure that you can see: Any grab bars or handrails. First and last steps. Where the edge of each step is. Use tools that help you move around (mobility aids) if they are needed. These  include: Canes. Walkers. Scooters. Crutches. Turn on the lights when you go into a dark area. Replace any light bulbs as soon as they burn out. Set up your furniture so you have a clear path. Avoid moving your furniture around. If any of your floors are uneven, fix them. If there are any pets around you, be aware of where they are. Review your medicines with your doctor. Some medicines can make you feel dizzy. This can increase your chance of falling. Ask your doctor what other things that you can do to help prevent falls. This information is not intended to replace advice given to you by your health care provider. Make sure you discuss any questions you have with your health care provider. Document Released: 10/05/2009 Document Revised: 05/16/2016 Document Reviewed: 01/13/2015 Elsevier Interactive Patient Education  2017 [employer].

## 2022-02-18 ENCOUNTER — Other Ambulatory Visit: Payer: Self-pay | Admitting: Family Medicine

## 2022-02-18 DIAGNOSIS — M792 Neuralgia and neuritis, unspecified: Secondary | ICD-10-CM

## 2022-02-18 NOTE — Telephone Encounter (Signed)
Patient is requesting a refill of the following medications: Requested Prescriptions   Pending Prescriptions Disp Refills   fluticasone (FLONASE) 50 MCG/ACT nasal spray [Pharmacy Med Name: FLUTICASONE PROP 50 MCG SPRAY] 48 mL 1    Sig: SPRAY 2 SPRAYS INTO EACH NOSTRIL EVERY DAY   gabapentin (NEURONTIN) 600 MG tablet [Pharmacy Med Name: GABAPENTIN 600 MG TABLET] 180 tablet 1    Sig: TAKE 1 TABLET BY MOUTH TWICE A DAY    Date of patient request: 02/18/22 Last office visit: 05/31/21 Date of last refill: 08/20/21 Last refill amount: 180x1R

## 2022-02-18 NOTE — Telephone Encounter (Signed)
Refilled, with plan to schedule OV

## 2022-02-20 ENCOUNTER — Ambulatory Visit (INDEPENDENT_AMBULATORY_CARE_PROVIDER_SITE_OTHER): Payer: PPO | Admitting: Registered Nurse

## 2022-02-20 ENCOUNTER — Encounter: Payer: Self-pay | Admitting: Registered Nurse

## 2022-02-20 VITALS — BP 136/60 | HR 73 | Temp 98.0°F | Resp 17 | Ht 68.0 in | Wt 229.2 lb

## 2022-02-20 DIAGNOSIS — J019 Acute sinusitis, unspecified: Secondary | ICD-10-CM

## 2022-02-20 MED ORDER — PREDNISONE 10 MG (21) PO TBPK: ORAL_TABLET | ORAL | 0 refills | Status: DC

## 2022-02-20 MED ORDER — AMOXICILLIN 875 MG PO TABS: 875.0000 mg | ORAL_TABLET | Freq: Two times a day (BID) | ORAL | 0 refills | Status: AC

## 2022-02-20 NOTE — Progress Notes (Signed)
Established Patient Office Visit  Subjective:  Patient ID: Cambelle Suchecki, female    DOB: Sep 11, 1956  Age: 66 y.o. MRN: [id number]  CC:  Chief Complaint  Patient presents with   Cough    Patient states she had been having a cough , congestion , and a headache sine 02/14/2022. Patient states she took some Clsaritin , flonase and a sudafed    HPI Daphnee Preiss presents for cough  Onset 02/14/22 Congestion, frontal headache Has been taking OTC antihistamines, flonase, cough and cold. Limited relief.   Denies sore throat, shob, doe, chest pain, fevers, chills, nvd  Home test for covid negative.   Past Medical History:  Diagnosis Date   Allergy    GERD (gastroesophageal reflux disease)    Headache(784.0)    sinus   High cholesterol    History of Graves' disease    Hx of radioactive iodine thyroid ablation    Hyperlipidemia    Phreesia 11/08/2020   Hypothyroidism    Sickle cell anemia ([place])    Phreesia 11/08/2020    Past Surgical History:  Procedure Laterality Date   ABDOMINAL HYSTERECTOMY     APPENDECTOMY     CATARACT EXTRACTION W/PHACO Right 09/22/2013   Procedure: CATARACT EXTRACTION PHACO AND INTRAOCULAR LENS PLACEMENT ([place]);  Surgeon: [name], MD;  Location: [facility];  Service: Ophthalmology;  Laterality: Right;   CATARACT EXTRACTION W/PHACO Left 12/29/2013   Procedure: CATARACT EXTRACTION PHACO AND INTRAOCULAR LENS PLACEMENT ([facility]);  Surgeon: [name], MD;  Location: [facility];  Service: Ophthalmology;  Laterality: Left;   COLONOSCOPY     EYE SURGERY     HIP ARTHROPLASTY Bilateral    JOINT REPLACEMENT     OTHER SURGICAL HISTORY Right    carpal tunel injection   SHOULDER SURGERY     TONSILLECTOMY      Family History  Problem Relation Age of Onset   Hypertension Mother    Hyperlipidemia Mother    Diabetes Sister    Obesity Brother        gastric bypass 2018   Thyroid disease Sister    Obesity Sister     Social History   Socioeconomic  History   Marital status: Married    Spouse name: Not on file   Number of children: Not on file   Years of education: Not on file   Highest education level: Not on file  Occupational History   Not on file  Tobacco Use   Smoking status: Never   Smokeless tobacco: Never  Substance and Sexual Activity   Alcohol use: No   Drug use: No   Sexual activity: Yes    Partners: Male  Other Topics Concern   Not on file  Social History Narrative   Married. Education: [employer].    Social Determinants of Health   Financial Resource Strain: Low Risk    Difficulty of Paying Living Expenses: Not hard at all  Food Insecurity: No Food Insecurity   Worried About Charity fundraiser in the Last Year: Never true   [place] in the Last Year: Never true  Transportation Needs: No Transportation Needs   Lack of Transportation (Medical): No   Lack of Transportation (Non-Medical): No  Physical Activity: Sufficiently Active   Days of Exercise per Week: 4 days   Minutes of Exercise per Session: 60 min  Stress: No Stress Concern Present   Feeling of Stress : Not at all  Social Connections: Socially Integrated  Frequency of Communication with Friends and Family: Three times a week   Frequency of Social Gatherings with Friends and Family: Three times a week   Attends Religious Services: More than 4 times per year   Active Member of Clubs or Organizations: Yes   Attends Music therapist: More than 4 times per year   Marital Status: Married  Human resources officer Violence: Not At Risk   Fear of Current or Ex-Partner: No   Emotionally Abused: No   Physically Abused: No   Sexually Abused: No    Outpatient Medications Prior to Visit  Medication Sig Dispense Refill   acetaminophen (TYLENOL) 650 MG CR tablet Take 650 mg by mouth every 8 (eight) hours as needed for pain.     Calcium Carbonate (CALCIUM 600 PO) Take by mouth.     cetirizine (ZYRTEC) 10 MG tablet 1 tablet      Cholecalciferol (VITAMIN D) 50 MCG (2000 UT) tablet Take 2,000 Units by mouth daily.     Coenzyme Q10 (CO Q 10 PO) Take 75 mg by mouth 3 (three) times daily.     fluticasone (FLONASE) 50 MCG/ACT nasal spray SPRAY 2 SPRAYS INTO EACH NOSTRIL EVERY DAY 48 mL 1   gabapentin (NEURONTIN) 600 MG tablet TAKE 1 TABLET BY MOUTH TWICE A DAY 180 tablet 0   guaiFENesin (MUCINEX) 600 MG 12 hr tablet 1 tablet as needed     ibuprofen (ADVIL,MOTRIN) 600 MG tablet Take 1 tablet (600 mg total) by mouth every 8 (eight) hours as needed (with food). 30 tablet 0   Misc Natural Products (CALCIUM PLUS ADVANCED PO) Take 600 mg by mouth 2 (two) times daily.      Multiple Vitamins-Minerals (CENTRUM SILVER PO) Take by mouth.     Omega-3 Fatty Acids (FISH OIL) 1200 MG CAPS Take 1,200 mg by mouth 3 (three) times daily.      pantoprazole (PROTONIX) 40 MG tablet Take 40 mg by mouth every morning.     RA Vitamin E-Vit A & D 20000 units CREA 1 tablet with a meal     rosuvastatin (CRESTOR) 5 MG tablet TAKE 1 TABLET BY MOUTH EVERY DAY AT 6PM 90 tablet 1   SYNTHROID 100 MCG tablet Take 1 tablet (100 mcg total) by mouth daily before breakfast. 90 tablet 1   vitamin E 400 UNIT capsule Take 400 Units by mouth daily.     Cyanocobalamin 2000 MCG/ML SOLN  (Patient not taking: Reported on 02/14/2022)     Fluticasone Furoate 50 MCG/ACT AEPB 50 mcg 1 puff in each nostril (Patient not taking: Reported on 02/14/2022)     No facility-administered medications prior to visit.    Allergies  Allergen Reactions   Other    Meloxicam Other (See Comments)    Causes Bruising    Pravastatin Sodium Other (See Comments)    Bruises   Simvastatin Other (See Comments)    Bruises    Tramadol Other (See Comments)   Tramadol Hcl Other (See Comments)    Bruising    Tramadol Hcl Other (See Comments)    ROS Review of Systems Per hpi     Objective:    Physical Exam Vitals and nursing note reviewed.  Constitutional:      General: She is not  in acute distress.    Appearance: Normal appearance. She is normal weight. She is not ill-appearing, toxic-appearing or diaphoretic.  Cardiovascular:     Rate and Rhythm: Normal rate and regular rhythm.  Heart sounds: Normal heart sounds. No murmur heard.   No friction rub. No gallop.  Pulmonary:     Effort: Pulmonary effort is normal. No respiratory distress.     Breath sounds: Normal breath sounds. No stridor. No wheezing, rhonchi or rales.  Chest:     Chest wall: No tenderness.  Skin:    General: Skin is warm and dry.  Neurological:     General: No focal deficit present.     Mental Status: She is alert and oriented to person, place, and time. Mental status is at baseline.  Psychiatric:        Mood and Affect: Mood normal.        Behavior: Behavior normal.        Thought Content: Thought content normal.        Judgment: Judgment normal.    BP 136/60    Pulse 73    Temp 98 F (36.7 C) (Temporal)    Resp 17    Ht 5\' 8"  (1.727 m)    Wt 229 lb 3.2 oz (104 kg)    SpO2 100%    BMI 34.85 kg/m  Wt Readings from Last 3 Encounters:  02/20/22 229 lb 3.2 oz (104 kg)  05/31/21 251 lb 12.8 oz (114.2 kg)  11/09/20 249 lb (112.9 kg)     Health Maintenance Due  Topic Date Due   Pneumonia Vaccine 52+ Years old (2 - PPSV23 if available, else PCV20) 08/17/2020   COVID-19 Vaccine (4 - Booster for [place] series) 11/20/2020    There are no preventive care reminders to display for this patient.  Lab Results  Component Value Date   TSH 2.95 05/31/2021   Lab Results  Component Value Date   WBC 4.6 02/09/2020   HGB 13.8 02/09/2020   HCT 40.8 02/09/2020   MCV 96 02/09/2020   PLT 187 02/09/2020   Lab Results  Component Value Date   NA 143 05/31/2021   K 5.0 05/31/2021   CO2 29 05/31/2021   GLUCOSE 73 05/31/2021   BUN 11 05/31/2021   CREATININE 0.80 05/31/2021   BILITOT 0.5 05/31/2021   ALKPHOS 77 05/31/2021   AST 27 05/31/2021   ALT 25 05/31/2021   PROT 7.6 05/31/2021    ALBUMIN 4.8 05/31/2021   CALCIUM 10.4 05/31/2021   ANIONGAP 10 03/21/2020   GFR 76.40 05/31/2021   Lab Results  Component Value Date   CHOL 165 05/31/2021   Lab Results  Component Value Date   HDL 92.90 05/31/2021   Lab Results  Component Value Date   LDLCALC 64 05/31/2021   Lab Results  Component Value Date   TRIG 41.0 05/31/2021   Lab Results  Component Value Date   CHOLHDL 2 05/31/2021   Lab Results  Component Value Date   HGBA1C 5.9 05/31/2021      Assessment & Plan:   Problem List Items Addressed This Visit   None Visit Diagnoses     Acute non-recurrent sinusitis, unspecified location    -  Primary   Relevant Medications   amoxicillin (AMOXIL) 875 MG tablet   predniSONE (STERAPRED UNI-PAK 21 TAB) 10 MG (21) TBPK tablet       Meds ordered this encounter  Medications   amoxicillin (AMOXIL) 875 MG tablet    Sig: Take 1 tablet (875 mg total) by mouth 2 (two) times daily for 10 days.    Dispense:  20 tablet    Refill:  0    Order Specific Question:  Supervising Provider    Answer:   Carlota Raspberry, JEFFREY R [[id number]]   predniSONE (STERAPRED UNI-PAK 21 TAB) 10 MG (21) TBPK tablet    Sig: Take per package instructions. Do not skip doses. Finish entire supply.    Dispense:  1 each    Refill:  0    Order Specific Question:   Supervising Provider    Answer:   Carlota Raspberry, JEFFREY R [[id number]]    Follow-up: Return if symptoms worsen or fail to improve.   PLAN Amoxicillin po bid x 10 days Prednisone for symptom relief. Continue claritin and flonase Return if worsening or failing to improve Patient encouraged to call clinic with any questions, comments, or concerns.   Maximiano Coss, NP

## 2022-02-20 NOTE — Patient Instructions (Addendum)
Ms. Trillo - ? ?Great to see you ? ?Call if things don't get better ? ?Finish the amoxicillin and prednisone even if you feel better. ? ?Continue flonase and claritin ? ?Ok to use sudafed as needed ? ?Tylenol ok for face pressure/pain ? ?Thanks, ? ?Rich  ? ? ? ?If you have lab work done today you will be contacted with your lab results within the next 2 weeks.  If you have not heard from [place] then please contact us. The fastest way to get your results is to register for My Chart. ? ? ?IF you received an x-ray today, you will receive an invoice from [facility]. Please contact [facility] at [phone] with questions or concerns regarding your invoice.  ? ?IF you received labwork today, you will receive an invoice from [facility]. Please contact [facility] at [phone] with questions or concerns regarding your invoice.  ? ?Our billing staff will not be able to assist you with questions regarding bills from these companies. ? ?You will be contacted with the lab results as soon as they are available. The fastest way to get your results is to activate your My Chart account. Instructions are located on the last page of this paperwork. If you have not heard from [place] regarding the results in 2 weeks, please contact this office. ?  ? ? ?

## 2022-04-23 DIAGNOSIS — M25579 Pain in unspecified ankle and joints of unspecified foot: Secondary | ICD-10-CM | POA: Diagnosis not present

## 2022-04-23 DIAGNOSIS — M25561 Pain in right knee: Secondary | ICD-10-CM | POA: Diagnosis not present

## 2022-04-23 DIAGNOSIS — R030 Elevated blood-pressure reading, without diagnosis of hypertension: Secondary | ICD-10-CM | POA: Diagnosis not present

## 2022-05-03 ENCOUNTER — Ambulatory Visit (INDEPENDENT_AMBULATORY_CARE_PROVIDER_SITE_OTHER): Payer: PPO | Admitting: Family Medicine

## 2022-05-03 ENCOUNTER — Encounter: Payer: Self-pay | Admitting: Family Medicine

## 2022-05-03 VITALS — BP 130/84 | HR 64 | Temp 97.7°F | Resp 17 | Ht 68.0 in | Wt 231.6 lb

## 2022-05-03 DIAGNOSIS — R051 Acute cough: Secondary | ICD-10-CM | POA: Diagnosis not present

## 2022-05-03 MED ORDER — GUAIFENESIN-CODEINE 100-10 MG/5ML PO SYRP: 10.0000 mL | ORAL_SOLUTION | Freq: Three times a day (TID) | ORAL | 0 refills | Status: DC | PRN

## 2022-05-03 MED ORDER — PREDNISONE 10 MG PO TABS: ORAL_TABLET | ORAL | 0 refills | Status: DC

## 2022-05-03 MED ORDER — AZELASTINE HCL 0.1 % NA SOLN: 2.0000 | Freq: Two times a day (BID) | NASAL | 1 refills | Status: DC

## 2022-05-03 MED ORDER — BENZONATATE 200 MG PO CAPS: 200.0000 mg | ORAL_CAPSULE | Freq: Three times a day (TID) | ORAL | 0 refills | Status: DC | PRN

## 2022-05-03 NOTE — Patient Instructions (Signed)
Follow up as needed or as scheduled ?START the Prednisone- 3 tabs at the same time x3 days, then 2 tabs at the same time x3 days, and then 1 tab daily.  Take w/ food ?Drink LOTS of water to rinse off all that drainage ?Continue the Claritin and Flonase ?ADD the Astelin nasal spray- 2 sprays each nostril twice daily ?Use the Tessalon to help w/ cough ?USE the codeine cough syrup before bed to help you sleep ?Call with any questions or concerns ?Hang in there!!! ?

## 2022-05-03 NOTE — Progress Notes (Signed)
   Subjective:    Patient ID: Veronica Martinez, female    DOB: 1956/04/13, 66 y.o.   MRN: [id number]  HPI Cough- 'i can't sleep at night due to the drainage and coughing'.  Sxs started Monday but acutely worsened yesterday.  Denies sinus pain/pressure.  Bilateral ear popping.  Copious PND.  No fevers.  No wheezing or SOB.  Taking Claritin D as of Tuesday.  No hx of asthma or COPD.  No known sick contacts.   Review of Systems For ROS see HPI     Objective:   Physical Exam Vitals reviewed.  Constitutional:      General: She is not in acute distress.    Appearance: Normal appearance. She is well-developed.  HENT:     Head: Normocephalic and atraumatic.  Eyes:     Conjunctiva/sclera: Conjunctivae normal.     Pupils: Pupils are equal, round, and reactive to light.  Cardiovascular:     Rate and Rhythm: Normal rate and regular rhythm.     Heart sounds: Normal heart sounds. No murmur heard. Pulmonary:     Effort: Pulmonary effort is normal. No respiratory distress.     Breath sounds: Normal breath sounds. No wheezing.     Comments: Deep hacking cough Musculoskeletal:     Cervical back: Normal range of motion and neck supple.  Lymphadenopathy:     Cervical: No cervical adenopathy.  Neurological:     General: No focal deficit present.     Mental Status: She is alert and oriented to person, place, and time.  Psychiatric:        Mood and Affect: Mood normal.        Behavior: Behavior normal.          Assessment & Plan:  Cough- new.  No evidence of bacterial infxn on PE.  Suspect this is a viral allergy combo.  Will start Prednisone to help w/ cough and nasal congestion.  Add Astelin daily to OTC antihistamine and Flonase.  Tessalon for cough.  Codeine cough syrup for night relief.  Reviewed supportive care and red flags that should prompt return.  Pt expressed understanding and is in agreement w/ plan.

## 2022-05-20 ENCOUNTER — Other Ambulatory Visit: Payer: Self-pay | Admitting: Family Medicine

## 2022-05-20 DIAGNOSIS — M792 Neuralgia and neuritis, unspecified: Secondary | ICD-10-CM

## 2022-05-22 NOTE — Telephone Encounter (Signed)
Medication discussed at her last visit with me May 31, 2021.  I do not see another appointment scheduled.  Controlled substance database reviewed without concerning findings.  Gabapentin temporary refill provided, please schedule follow-up appointment to review meds and updated labs.

## 2022-05-22 NOTE — Telephone Encounter (Signed)
Patient is requesting a refill of the following medications: Requested Prescriptions   Pending Prescriptions Disp Refills   gabapentin (NEURONTIN) 600 MG tablet [Pharmacy Med Name: GABAPENTIN 600 MG TABLET] 180 tablet 0    Sig: TAKE 1 TABLET BY MOUTH TWICE A DAY    Date of patient request: 05/22/22 Last office visit: 05/03/22 Date of last refill: 02/18/22 Last refill amount: 180

## 2022-05-23 NOTE — Telephone Encounter (Signed)
Sent a mychart message asking pt to call the office to schedule an appt.

## 2022-05-25 ENCOUNTER — Other Ambulatory Visit: Payer: Self-pay | Admitting: Family Medicine

## 2022-06-04 ENCOUNTER — Other Ambulatory Visit: Payer: Self-pay | Admitting: Family Medicine

## 2022-06-04 DIAGNOSIS — E785 Hyperlipidemia, unspecified: Secondary | ICD-10-CM

## 2022-06-11 DIAGNOSIS — M1711 Unilateral primary osteoarthritis, right knee: Secondary | ICD-10-CM | POA: Diagnosis not present

## 2022-06-20 ENCOUNTER — Other Ambulatory Visit: Payer: Self-pay | Admitting: Family Medicine

## 2022-07-14 ENCOUNTER — Other Ambulatory Visit: Payer: Self-pay | Admitting: Family Medicine

## 2022-07-16 DIAGNOSIS — K6389 Other specified diseases of intestine: Secondary | ICD-10-CM | POA: Diagnosis not present

## 2022-07-16 DIAGNOSIS — Z09 Encounter for follow-up examination after completed treatment for conditions other than malignant neoplasm: Secondary | ICD-10-CM | POA: Diagnosis not present

## 2022-07-16 DIAGNOSIS — K648 Other hemorrhoids: Secondary | ICD-10-CM | POA: Diagnosis not present

## 2022-07-16 DIAGNOSIS — Z86010 Personal history of colonic polyps: Secondary | ICD-10-CM | POA: Diagnosis not present

## 2022-07-16 DIAGNOSIS — K644 Residual hemorrhoidal skin tags: Secondary | ICD-10-CM | POA: Diagnosis not present

## 2022-07-21 ENCOUNTER — Other Ambulatory Visit: Payer: Self-pay | Admitting: Family Medicine

## 2022-07-21 DIAGNOSIS — E039 Hypothyroidism, unspecified: Secondary | ICD-10-CM

## 2022-08-09 ENCOUNTER — Other Ambulatory Visit: Payer: Self-pay | Admitting: Family Medicine

## 2022-08-18 ENCOUNTER — Other Ambulatory Visit: Payer: Self-pay | Admitting: Family Medicine

## 2022-08-18 DIAGNOSIS — M792 Neuralgia and neuritis, unspecified: Secondary | ICD-10-CM

## 2022-08-19 ENCOUNTER — Other Ambulatory Visit: Payer: Self-pay | Admitting: Family Medicine

## 2022-08-19 NOTE — Telephone Encounter (Signed)
Medication discussed 05/31/2021.  Message requesting appointment was sent on 05/23/2022.  30-day supply provided of gabapentin, but needs appointment.  I do not see one scheduled as of this time.

## 2022-09-05 ENCOUNTER — Other Ambulatory Visit: Payer: Self-pay | Admitting: Family Medicine

## 2022-09-25 ENCOUNTER — Encounter: Payer: Self-pay | Admitting: Family Medicine

## 2022-09-25 ENCOUNTER — Ambulatory Visit (INDEPENDENT_AMBULATORY_CARE_PROVIDER_SITE_OTHER): Payer: PPO | Admitting: Family Medicine

## 2022-09-25 VITALS — BP 138/78 | HR 67 | Temp 97.8°F | Ht 64.0 in | Wt 232.0 lb

## 2022-09-25 DIAGNOSIS — R7303 Prediabetes: Secondary | ICD-10-CM | POA: Diagnosis not present

## 2022-09-25 DIAGNOSIS — Z0000 Encounter for general adult medical examination without abnormal findings: Secondary | ICD-10-CM

## 2022-09-25 DIAGNOSIS — E2839 Other primary ovarian failure: Secondary | ICD-10-CM

## 2022-09-25 DIAGNOSIS — Z13820 Encounter for screening for osteoporosis: Secondary | ICD-10-CM

## 2022-09-25 DIAGNOSIS — K219 Gastro-esophageal reflux disease without esophagitis: Secondary | ICD-10-CM | POA: Diagnosis not present

## 2022-09-25 DIAGNOSIS — E785 Hyperlipidemia, unspecified: Secondary | ICD-10-CM

## 2022-09-25 DIAGNOSIS — Z90711 Acquired absence of uterus with remaining cervical stump: Secondary | ICD-10-CM | POA: Diagnosis not present

## 2022-09-25 DIAGNOSIS — N952 Postmenopausal atrophic vaginitis: Secondary | ICD-10-CM | POA: Diagnosis not present

## 2022-09-25 DIAGNOSIS — Z23 Encounter for immunization: Secondary | ICD-10-CM

## 2022-09-25 DIAGNOSIS — R030 Elevated blood-pressure reading, without diagnosis of hypertension: Secondary | ICD-10-CM | POA: Diagnosis not present

## 2022-09-25 DIAGNOSIS — Z1239 Encounter for other screening for malignant neoplasm of breast: Secondary | ICD-10-CM | POA: Diagnosis not present

## 2022-09-25 DIAGNOSIS — M792 Neuralgia and neuritis, unspecified: Secondary | ICD-10-CM | POA: Diagnosis not present

## 2022-09-25 DIAGNOSIS — E039 Hypothyroidism, unspecified: Secondary | ICD-10-CM | POA: Diagnosis not present

## 2022-09-25 DIAGNOSIS — Z1211 Encounter for screening for malignant neoplasm of colon: Secondary | ICD-10-CM | POA: Diagnosis not present

## 2022-09-25 MED ORDER — GABAPENTIN 600 MG PO TABS: 600.0000 mg | ORAL_TABLET | Freq: Two times a day (BID) | ORAL | 1 refills | Status: DC

## 2022-09-25 MED ORDER — PANTOPRAZOLE SODIUM 40 MG PO TBEC: 40.0000 mg | DELAYED_RELEASE_TABLET | Freq: Every morning | ORAL | 5 refills | Status: DC

## 2022-09-25 MED ORDER — ROSUVASTATIN CALCIUM 5 MG PO TABS: ORAL_TABLET | ORAL | 1 refills | Status: DC

## 2022-09-25 MED ORDER — SYNTHROID 100 MCG PO TABS: 100.0000 ug | ORAL_TABLET | Freq: Every day | ORAL | 1 refills | Status: DC

## 2022-09-25 NOTE — Progress Notes (Signed)
Subjective:  Patient ID: Veronica Martinez, female    DOB: 08/07/1956  Age: 66 y.o. MRN: [id number]  CC:  Chief Complaint  Patient presents with   Annual Exam    Pt states all is well    HPI Kenedee Molesky presents for Annual Exam PCP: me GYN: Dr. Florene Glen - visit this morning with pap testing. Mammogram tomorrow.   Last visit with me in June 2022.  Has seen my colleagues since that time.  Hypothyroidism: Lab Results  Component Value Date   TSH 2.95 05/31/2021  Taking medication daily. Synthroid 154mg qd.  No new hot or cold intolerance. No new hair or skin changes, heart palpitations or new fatigue. No new weight changes- intentional weight loss with walking.   Wt Readings from Last 3 Encounters:  09/25/22 232 lb (105.2 kg)  05/03/22 231 lb 9.6 oz (105.1 kg)  02/20/22 229 lb 3.2 oz (104 kg)     Meralgia paresthetica: Controlled with gabapentin 2 per day. No new side effects.   Hyperlipidemia: Crestor '5mg'$  qd. No new side effects.  Lab Results  Component Value Date   CHOL 165 05/31/2021   HDL 92.90 05/31/2021   LDLCALC 64 05/31/2021   TRIG 41.0 05/31/2021   CHOLHDL 2 05/31/2021   Lab Results  Component Value Date   ALT 25 05/31/2021   AST 27 05/31/2021   ALKPHOS 77 05/31/2021   BILITOT 0.5 05/31/2021   GERD: Protonix '40mg'$   - intermittent dosing working well. Knows food triggers.      09/25/2022    2:05 PM 05/03/2022    9:47 AM 02/20/2022    9:43 AM 02/14/2022    3:55 PM 02/14/2022    3:53 PM  Depression screen PHQ 2/9  Decreased Interest 0 0 0 0 0  Down, Depressed, Hopeless 0 0 0 0 0  PHQ - 2 Score 0 0 0 0 0  Altered sleeping 0 0 0    Tired, decreased energy 0 0 0    Change in appetite 0 0 0    Feeling bad or failure about yourself  0 0 0    Trouble concentrating 0 0 0    Moving slowly or fidgety/restless 0 0 0    Suicidal thoughts 0 0 0    PHQ-9 Score 0 0 0    Difficult doing work/chores  Not difficult at all Not difficult at all       Health Maintenance  Topic Date Due   INFLUENZA VACCINE  07/23/2022   MAMMOGRAM  10/08/2022   TETANUS/TDAP  01/10/2025   COLONOSCOPY (Pts 45-433yrInsurance coverage will need to be confirmed)  10/25/2026   Pneumonia Vaccine 6524Years old  Completed   DEXA SCAN  Completed   Hepatitis C Screening  Completed   Zoster Vaccines- Shingrix  Completed   HPV VACCINES  Aged Out   COVID-19 Vaccine  Discontinued  Colonoscopy in July - [facility].  Pap today at GYN, mammogram later this week.  Bone density normal in 2020.   Immunization History  Administered Date(s) Administered   Fluad Quad(high Dose 65+) 08/18/2019, 09/21/2020   Influenza Split 09/01/2012   Influenza,inj,Quad PF,6+ Mos 09/07/2013, 01/10/2015, 12/26/2015, 08/21/2017, 02/15/2019   PFIZER(Purple Top)SARS-COV-2 Vaccination 02/05/2020, 03/01/2020, 09/25/2020   Pneumococcal Conjugate-13 08/18/2019   Pneumococcal Polysaccharide-23 09/25/2022   Tdap 01/10/2015   Zoster Recombinat (Shingrix) 08/18/2019, 06/22/2020  RSV vaccine - at drug store.   No results found. Optho appt in November - yearly.   Dental:Within  Last 6 months every 6 months.   Alcohol:none  Tobacco: none  Exercise: walking 4 d per week.    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 08/10/2013   Trigger finger, acquired 04/22/2013   Carpal tunnel syndrome of right wrist 04/22/2013   Pain of right heel 12/08/2012   Myalgia 11/10/2012   OBESITY, UNSPECIFIED 02/20/2011    BACK PAIN, LUMBAR 07/05/2010   ABNORMALITY OF GAIT 05/02/2010   MERALGIA PARESTHETICA 03/02/2010   HIP PAIN, BILATERAL 03/02/2010   Past Medical History:  Diagnosis Date   Allergy    GERD (gastroesophageal reflux disease)    Headache(784.0)    sinus   High cholesterol    History of Graves' disease    Hx of radioactive iodine thyroid ablation    Hyperlipidemia    Phreesia 11/08/2020   Hypothyroidism    Sickle cell anemia ([place])    Phreesia 11/08/2020   Past Surgical History:  Procedure Laterality Date   ABDOMINAL HYSTERECTOMY     APPENDECTOMY     CATARACT EXTRACTION W/PHACO Right 09/22/2013   Procedure: CATARACT EXTRACTION PHACO AND INTRAOCULAR LENS PLACEMENT ([place]);  Surgeon: [name], MD;  Location: [place];  Service: Ophthalmology;  Laterality: Right;   CATARACT EXTRACTION W/PHACO Left 12/29/2013   Procedure: CATARACT EXTRACTION PHACO AND INTRAOCULAR LENS PLACEMENT ([facility]);  Surgeon: [name], MD;  Location: [place];  Service: Ophthalmology;  Laterality: Left;   COLONOSCOPY     EYE SURGERY     HIP ARTHROPLASTY Bilateral    JOINT REPLACEMENT     OTHER SURGICAL HISTORY Right    carpal tunel injection   SHOULDER SURGERY     TONSILLECTOMY     Allergies  Allergen Reactions   Other    Meloxicam Other (See Comments)    Causes Bruising    Pravastatin Sodium Other (See Comments)    Bruises   Simvastatin Other (See Comments)    Bruises    Tramadol Other (See Comments)   Tramadol Hcl Other (See Comments)    Bruising    Tramadol Hcl Other (See Comments)   Prior to Admission medications   Medication Sig Start Date End Date Taking? Authorizing Provider  acetaminophen (TYLENOL) 650 MG CR tablet Take 650 mg by mouth every 8 (eight) hours as needed for pain.   Yes [provider]  Azelastine HCl 137 MCG/SPRAY SOLN PLACE 2 SPRAYS INTO BOTH NOSTRILS 2 (TWO) TIMES DAILY. USE IN EACH NOSTRIL AS DIRECTED 08/09/22  Yes Midge Minium, MD  Calcium Carbonate (CALCIUM 600  PO) Take by mouth.   Yes [provider]  cetirizine (ZYRTEC) 10 MG tablet 1 tablet   Yes [provider]  Cholecalciferol (VITAMIN D) 50 MCG (2000 UT) tablet Take 2,000 Units by mouth daily.   Yes [provider]  Coenzyme Q10 (CO Q 10 PO) Take 75 mg by mouth 3 (three) times daily.   Yes [provider]  fluticasone (FLONASE) 50 MCG/ACT nasal spray SPRAY 2 SPRAYS INTO EACH NOSTRIL EVERY DAY 08/19/22  Yes Carlota Raspberry,  Ranell Patrick, MD  Fluticasone Furoate 50 MCG/ACT AEPB    Yes [provider]  gabapentin (NEURONTIN) 600 MG tablet TAKE 1 TABLET BY MOUTH TWICE A DAY 08/19/22  Yes Wendie Agreste, MD  ibuprofen (ADVIL,MOTRIN) 600 MG tablet Take 1 tablet (600 mg total) by mouth every 8 (eight) hours as needed (with food). 10/21/17  Yes Wendie Agreste, MD  Misc Natural Products (CALCIUM PLUS ADVANCED PO) Take 600 mg by mouth 2 (two) times daily.    Yes [provider]  Multiple Vitamins-Minerals (CENTRUM SILVER PO) Take by mouth.   Yes [provider]  Omega-3 Fatty Acids (FISH OIL) 1200 MG CAPS Take 1,200 mg by mouth 3 (three) times daily.    Yes [provider]  pantoprazole (PROTONIX) 40 MG tablet Take 40 mg by mouth every morning. 11/05/21  Yes [provider]  RA Vitamin E-Vit A & D 20000 units CREA 1 tablet with a meal   Yes [provider]  rosuvastatin (CRESTOR) 5 MG tablet TAKE 1 TABLET BY MOUTH EVERY DAY AT [facility] 06/04/22  Yes Wendie Agreste, MD  SYNTHROID 100 MCG tablet TAKE 1 TABLET BY MOUTH DAILY BEFORE BREAKFAST. 07/22/22  Yes Wendie Agreste, MD  vitamin E 400 UNIT capsule Take 400 Units by mouth daily.   Yes [provider]  benzonatate (TESSALON) 200 MG capsule Take 1 capsule (200 mg total) by mouth 3 (three) times daily as needed for cough. Patient not taking: Reported on 09/25/2022 05/03/22   Midge Minium, MD  Cyanocobalamin 2000 MCG/ML SOLN     [provider]  guaiFENesin  (MUCINEX) 600 MG 12 hr tablet 1 tablet as needed Patient not taking: Reported on 09/25/2022    [provider]  guaiFENesin-codeine (ROBITUSSIN AC) 100-10 MG/5ML syrup Take 10 mLs by mouth 3 (three) times daily as needed for cough. Patient not taking: Reported on 09/25/2022 05/03/22   Midge Minium, MD  predniSONE (DELTASONE) 10 MG tablet 3 tabs x3 days and then 2 tabs x3 days and then 1 tab x3 days.  Take w/ food. Patient not taking: Reported on 09/25/2022 05/03/22   Midge Minium, MD   Social History   Socioeconomic History   Marital status: Married    Spouse name: Not on file   Number of children: Not on file   Years of education: Not on file   Highest education level: Not on file  Occupational History   Not on file  Tobacco Use   Smoking status: Never   Smokeless tobacco: Never  Substance and Sexual Activity   Alcohol use: No   Drug use: No   Sexual activity: Yes    Partners: Male  Other Topics Concern   Not on file  Social History Narrative   Married. Education: [employer].    Social Determinants of Health   Financial Resource Strain: Low Risk  (02/14/2022)   Overall Financial Resource Strain (CARDIA)    Difficulty of Paying Living Expenses: Not hard at all  Food Insecurity: No Food Insecurity (02/14/2022)   Hunger Vital Sign    Worried About Running Out of Food in the Last Year: Never true    Ran Out of Food in the Last Year: Never true  Transportation Needs: No Transportation Needs (02/14/2022)   PRAPARE - Hydrologist (Medical): No    Lack of Transportation (Non-Medical): No  Physical Activity: Sufficiently Active (02/14/2022)   Exercise Vital Sign  Days of Exercise per Week: 4 days    Minutes of Exercise per Session: 60 min  Stress: No Stress Concern Present (02/14/2022)   [place]    Feeling of Stress : Not at all  Social Connections: [place] (02/14/2022)   Social Connection and Isolation Panel [NHANES]    Frequency of Communication with Friends and Family: Three times a week    Frequency of Social Gatherings with Friends and Family: Three times a week    Attends Religious Services: More than 4 times per year    Active Member of Clubs or Organizations: Yes    Attends Archivist Meetings: More than 4 times per year    Marital Status: Married  Human resources officer Violence: Not At Risk (02/14/2022)   Humiliation, Afraid, Rape, and Kick questionnaire    Fear of Current or Ex-Partner: No    Emotionally Abused: No    Physically Abused: No    Sexually Abused: No    Review of Systems  13 point review of systems per patient health survey noted.  Negative other than as indicated above or in HPI.   Objective:   Vitals:   09/25/22 1408  BP: 138/78  Pulse: 67  Temp: 97.8 F (36.6 C)  SpO2: 95%  Weight: 232 lb (105.2 kg)  Height: '5\' 4"'$  (1.626 m)     Physical Exam Constitutional:      Appearance: She is well-developed.  HENT:     Head: Normocephalic and atraumatic.     Right Ear: External ear normal.     Left Ear: External ear normal.  Eyes:     Conjunctiva/sclera: Conjunctivae normal.     Pupils: Pupils are equal, round, and reactive to light.  Neck:     Thyroid: No thyromegaly.  Cardiovascular:     Rate and Rhythm: Normal rate and regular rhythm.     Heart sounds: Normal heart sounds. No murmur heard. Pulmonary:     Effort: Pulmonary effort is normal. No respiratory distress.     Breath sounds: Normal breath sounds. No wheezing.  Abdominal:     General: Bowel sounds are normal.     Palpations: Abdomen is soft.     Tenderness: There is no abdominal tenderness.  Musculoskeletal:        General: No tenderness. Normal range of motion.     Cervical back: Normal range of motion and neck supple.  Lymphadenopathy:     Cervical: No cervical adenopathy.  Skin:    General: Skin is warm and dry.      Findings: No rash.  Neurological:     Mental Status: She is alert and oriented to person, place, and time.  Psychiatric:        Behavior: Behavior normal.        Thought Content: Thought content normal.        Assessment & Plan:  Larken Urias is a 66 y.o. female . Annual physical exam  - -anticipatory guidance as below in AVS, screening labs above. Health maintenance items as above in HPI discussed/recommended as applicable.   Need for influenza vaccination - Plan: CANCELED: Flu Vaccine QUAD High Dose(Fluad)  Hyperlipidemia, unspecified hyperlipidemia type - Plan: rosuvastatin (CRESTOR) 5 MG tablet, Comprehensive metabolic panel, Lipid panel  -  Stable, tolerating current regimen. Medications refilled. Labs pending as above.   Hypothyroidism, unspecified type - Plan: SYNTHROID 100 MCG tablet, TSH  -Check TSH, continue same dose Synthroid for  now.  Nerve pain - Plan: gabapentin (NEURONTIN) 600 MG tablet  -Option of daily or intermittent dosing if symptoms have improved for the meralgia paresthetica.  Tolerating current dosing, okay to continue twice daily dosing if needed.  Refilled.  Estrogen deficiency - Plan: DG Bone Density Screening for osteoporosis - Plan: DG Bone Density  Gastroesophageal reflux disease, unspecified whether esophagitis present - Plan: pantoprazole (PROTONIX) 40 MG tablet  -Stable with intermittent PPI usage.  Need for pneumococcal vaccination - Plan: Pneumococcal polysaccharide vaccine 23-valent greater than or equal to 2yo subcutaneous/IM  Prediabetes - Plan: Hemoglobin A1c  -Continue walking, diet/exercise approach.  Meds ordered this encounter  Medications   rosuvastatin (CRESTOR) 5 MG tablet    Sig: 1 tab every night at 6pm    Dispense:  90 tablet    Refill:  1   SYNTHROID 100 MCG tablet    Sig: Take 1 tablet (100 mcg total) by mouth daily before breakfast.    Dispense:  90 tablet    Refill:  1   pantoprazole (PROTONIX) 40 MG tablet     Sig: Take 1 tablet (40 mg total) by mouth every morning.    Dispense:  30 tablet    Refill:  5   gabapentin (NEURONTIN) 600 MG tablet    Sig: Take 1 tablet (600 mg total) by mouth 2 (two) times daily.    Dispense:  180 tablet    Refill:  1   Patient Instructions  Flu and RSV vaccines can be given at your pharmacy as well as the updated COVID booster.  Pneumonia vaccine given today.  If any concerns on your labs I will let you know.  If leg symptoms are stable you can try a lower dose of gabapentin but okay to continue same dosing if needed.  No other med changes at this time.  Thank you for coming in today and let me know if there are questions.  Health Maintenance After Age 12 After age 81, you are at a higher risk for certain long-term diseases and infections as well as injuries from falls. Falls are a major cause of broken bones and head injuries in people who are older than age 33. Getting regular preventive care can help to keep you healthy and well. Preventive care includes getting regular testing and making lifestyle changes as recommended by your health care provider. Talk with your health care provider about: Which screenings and tests you should have. A screening is a test that checks for a disease when you have no symptoms. A diet and exercise plan that is right for you. What should I know about screenings and tests to prevent falls? Screening and testing are the best ways to find a health problem early. Early diagnosis and treatment give you the best chance of managing medical conditions that are common after age 91. Certain conditions and lifestyle choices may make you more likely to have a fall. Your health care provider may recommend: Regular vision checks. Poor vision and conditions such as cataracts can make you more likely to have a fall. If you wear glasses, make sure to get your prescription updated if your vision changes. Medicine review. Work with your health care provider  to regularly review all of the medicines you are taking, including over-the-counter medicines. Ask your health care provider about any side effects that may make you more likely to have a fall. Tell your health care provider if any medicines that you take make you feel dizzy or  sleepy. Strength and balance checks. Your health care provider may recommend certain tests to check your strength and balance while standing, walking, or changing positions. Foot health exam. Foot pain and numbness, as well as not wearing proper footwear, can make you more likely to have a fall. Screenings, including: Osteoporosis screening. Osteoporosis is a condition that causes the bones to get weaker and break more easily. Blood pressure screening. Blood pressure changes and medicines to control blood pressure can make you feel dizzy. Depression screening. You may be more likely to have a fall if you have a fear of falling, feel depressed, or feel unable to do activities that you used to do. Alcohol use screening. Using too much alcohol can affect your balance and may make you more likely to have a fall. Follow these instructions at home: Lifestyle Do not drink alcohol if: Your health care provider tells you not to drink. If you drink alcohol: Limit how much you have to: 0-1 drink a day for women. 0-2 drinks a day for men. Know how much alcohol is in your drink. In the [place] one drink equals one 12 oz bottle of beer (355 mL), one 5 oz glass of wine (148 mL), or one 1 oz glass of hard liquor (44 mL). Do not use any products that contain nicotine or tobacco. These products include cigarettes, chewing tobacco, and vaping devices, such as e-cigarettes. If you need help quitting, ask your health care provider. Activity  Follow a regular exercise program to stay fit. This will help you maintain your balance. Ask your health care provider what types of exercise are appropriate for you. If you need a cane or walker, use it  as recommended by your health care provider. Wear supportive shoes that have nonskid soles. Safety  Remove any tripping hazards, such as rugs, cords, and clutter. Install safety equipment such as grab bars in bathrooms and safety rails on stairs. Keep rooms and walkways well-lit. General instructions Talk with your health care provider about your risks for falling. Tell your health care provider if: You fall. Be sure to tell your health care provider about all falls, even ones that seem minor. You feel dizzy, tiredness (fatigue), or off-balance. Take over-the-counter and prescription medicines only as told by your health care provider. These include supplements. Eat a healthy diet and maintain a healthy weight. A healthy diet includes low-fat dairy products, low-fat (lean) meats, and fiber from whole grains, beans, and lots of fruits and vegetables. Stay current with your vaccines. Schedule regular health, dental, and eye exams. Summary Having a healthy lifestyle and getting preventive care can help to protect your health and wellness after age 48. Screening and testing are the best way to find a health problem early and help you avoid having a fall. Early diagnosis and treatment give you the best chance for managing medical conditions that are more common for people who are older than age 105. Falls are a major cause of broken bones and head injuries in people who are older than age 64. Take precautions to prevent a fall at home. Work with your health care provider to learn what changes you can make to improve your health and wellness and to prevent falls. This information is not intended to replace advice given to you by your health care provider. Make sure you discuss any questions you have with your health care provider. Document Revised: 04/30/2021 Document Reviewed: 04/30/2021 Elsevier Patient Education  [place],  [name], MD [place],  [facility] 09/25/22 5:32 PM

## 2022-09-25 NOTE — Patient Instructions (Addendum)
Flu and RSV vaccines can be given at your pharmacy as well as the updated COVID booster.  Pneumonia vaccine given today.  If any concerns on your labs I will let you know.  If leg symptoms are stable you can try a lower dose of gabapentin but okay to continue same dosing if needed.  No other med changes at this time.  Thank you for coming in today and let me know if there are questions.  Health Maintenance After Age 68 After age 16, you are at a higher risk for certain long-term diseases and infections as well as injuries from falls. Falls are a major cause of broken bones and head injuries in people who are older than age 103. Getting regular preventive care can help to keep you healthy and well. Preventive care includes getting regular testing and making lifestyle changes as recommended by your health care provider. Talk with your health care provider about: Which screenings and tests you should have. A screening is a test that checks for a disease when you have no symptoms. A diet and exercise plan that is right for you. What should I know about screenings and tests to prevent falls? Screening and testing are the best ways to find a health problem early. Early diagnosis and treatment give you the best chance of managing medical conditions that are common after age 98. Certain conditions and lifestyle choices may make you more likely to have a fall. Your health care provider may recommend: Regular vision checks. Poor vision and conditions such as cataracts can make you more likely to have a fall. If you wear glasses, make sure to get your prescription updated if your vision changes. Medicine review. Work with your health care provider to regularly review all of the medicines you are taking, including over-the-counter medicines. Ask your health care provider about any side effects that may make you more likely to have a fall. Tell your health care provider if any medicines that you take make you feel dizzy  or sleepy. Strength and balance checks. Your health care provider may recommend certain tests to check your strength and balance while standing, walking, or changing positions. Foot health exam. Foot pain and numbness, as well as not wearing proper footwear, can make you more likely to have a fall. Screenings, including: Osteoporosis screening. Osteoporosis is a condition that causes the bones to get weaker and break more easily. Blood pressure screening. Blood pressure changes and medicines to control blood pressure can make you feel dizzy. Depression screening. You may be more likely to have a fall if you have a fear of falling, feel depressed, or feel unable to do activities that you used to do. Alcohol use screening. Using too much alcohol can affect your balance and may make you more likely to have a fall. Follow these instructions at home: Lifestyle Do not drink alcohol if: Your health care provider tells you not to drink. If you drink alcohol: Limit how much you have to: 0-1 drink a day for women. 0-2 drinks a day for men. Know how much alcohol is in your drink. In the [place] one drink equals one 12 oz bottle of beer (355 mL), one 5 oz glass of wine (148 mL), or one 1 oz glass of hard liquor (44 mL). Do not use any products that contain nicotine or tobacco. These products include cigarettes, chewing tobacco, and vaping devices, such as e-cigarettes. If you need help quitting, ask your health care provider. Activity  Follow  a regular exercise program to stay fit. This will help you maintain your balance. Ask your health care provider what types of exercise are appropriate for you. If you need a cane or walker, use it as recommended by your health care provider. Wear supportive shoes that have nonskid soles. Safety  Remove any tripping hazards, such as rugs, cords, and clutter. Install safety equipment such as grab bars in bathrooms and safety rails on stairs. Keep rooms and walkways  well-lit. General instructions Talk with your health care provider about your risks for falling. Tell your health care provider if: You fall. Be sure to tell your health care provider about all falls, even ones that seem minor. You feel dizzy, tiredness (fatigue), or off-balance. Take over-the-counter and prescription medicines only as told by your health care provider. These include supplements. Eat a healthy diet and maintain a healthy weight. A healthy diet includes low-fat dairy products, low-fat (lean) meats, and fiber from whole grains, beans, and lots of fruits and vegetables. Stay current with your vaccines. Schedule regular health, dental, and eye exams. Summary Having a healthy lifestyle and getting preventive care can help to protect your health and wellness after age 74. Screening and testing are the best way to find a health problem early and help you avoid having a fall. Early diagnosis and treatment give you the best chance for managing medical conditions that are more common for people who are older than age 81. Falls are a major cause of broken bones and head injuries in people who are older than age 8. Take precautions to prevent a fall at home. Work with your health care provider to learn what changes you can make to improve your health and wellness and to prevent falls. This information is not intended to replace advice given to you by your health care provider. Make sure you discuss any questions you have with your health care provider. Document Revised: 04/30/2021 Document Reviewed: 04/30/2021 Elsevier Patient Education  [place].

## 2022-09-26 LAB — COMPREHENSIVE METABOLIC PANEL
ALT: 19 U/L (ref 0–35)
AST: 28 U/L (ref 0–37)
Albumin: 4.7 g/dL (ref 3.5–5.2)
Alkaline Phosphatase: 73 U/L (ref 39–117)
BUN: 15 mg/dL (ref 6–23)
CO2: 30 mEq/L (ref 19–32)
Calcium: 10.5 mg/dL (ref 8.4–10.5)
Chloride: 104 mEq/L (ref 96–112)
Creatinine, Ser: 0.91 mg/dL (ref 0.40–1.20)
GFR: 64.85 mL/min (ref >60.00)
Glucose, Bld: 79 mg/dL (ref 70–99)
Potassium: 4.4 mEq/L (ref 3.5–5.1)
Sodium: 141 mEq/L (ref 135–145)
Total Bilirubin: 0.5 mg/dL (ref 0.2–1.2)
Total Protein: 7.9 g/dL (ref 6.0–8.3)

## 2022-09-26 LAB — LIPID PANEL
Cholesterol: 170 mg/dL (ref 0–200)
HDL: 96.9 mg/dL (ref >39.00)
LDL Cholesterol: 65 mg/dL (ref 0–99)
NonHDL: 72.72
Total CHOL/HDL Ratio: 2
Triglycerides: 39 mg/dL (ref 0.0–149.0)
VLDL: 7.8 mg/dL (ref 0.0–40.0)

## 2022-09-26 LAB — HEMOGLOBIN A1C: Hgb A1c MFr Bld: 5.8 % (ref 4.6–6.5)

## 2022-09-26 LAB — TSH: TSH: 3.54 u[IU]/mL (ref 0.35–5.50)

## 2022-09-30 ENCOUNTER — Other Ambulatory Visit: Payer: Self-pay | Admitting: Obstetrics and Gynecology

## 2022-09-30 DIAGNOSIS — R921 Mammographic calcification found on diagnostic imaging of breast: Secondary | ICD-10-CM

## 2022-10-24 ENCOUNTER — Other Ambulatory Visit: Payer: Self-pay | Admitting: Obstetrics and Gynecology

## 2022-10-24 ENCOUNTER — Ambulatory Visit
Admission: RE | Admit: 2022-10-24 | Discharge: 2022-10-24 | Disposition: A | Discharge status: Home / Self Care | Payer: PPO | Source: Ambulatory Visit | Attending: Obstetrics and Gynecology | Admitting: Obstetrics and Gynecology

## 2022-10-24 DIAGNOSIS — R921 Mammographic calcification found on diagnostic imaging of breast: Secondary | ICD-10-CM

## 2022-10-24 DIAGNOSIS — R928 Other abnormal and inconclusive findings on diagnostic imaging of breast: Secondary | ICD-10-CM | POA: Diagnosis not present

## 2022-10-30 ENCOUNTER — Other Ambulatory Visit: Payer: Self-pay | Admitting: Obstetrics and Gynecology

## 2022-10-30 DIAGNOSIS — R921 Mammographic calcification found on diagnostic imaging of breast: Secondary | ICD-10-CM

## 2022-11-04 ENCOUNTER — Ambulatory Visit
Admission: RE | Admit: 2022-11-04 | Discharge: 2022-11-04 | Disposition: A | Discharge status: Home / Self Care | Payer: PPO | Source: Ambulatory Visit | Attending: Obstetrics and Gynecology | Admitting: Obstetrics and Gynecology

## 2022-11-04 DIAGNOSIS — H0264 Xanthelasma of left upper eyelid: Secondary | ICD-10-CM | POA: Diagnosis not present

## 2022-11-04 DIAGNOSIS — H43813 Vitreous degeneration, bilateral: Secondary | ICD-10-CM | POA: Diagnosis not present

## 2022-11-04 DIAGNOSIS — Z961 Presence of intraocular lens: Secondary | ICD-10-CM | POA: Diagnosis not present

## 2022-11-04 DIAGNOSIS — H0102B Squamous blepharitis left eye, upper and lower eyelids: Secondary | ICD-10-CM | POA: Diagnosis not present

## 2022-11-04 DIAGNOSIS — H26491 Other secondary cataract, right eye: Secondary | ICD-10-CM | POA: Diagnosis not present

## 2022-11-04 DIAGNOSIS — H04123 Dry eye syndrome of bilateral lacrimal glands: Secondary | ICD-10-CM | POA: Diagnosis not present

## 2022-11-04 DIAGNOSIS — H10413 Chronic giant papillary conjunctivitis, bilateral: Secondary | ICD-10-CM | POA: Diagnosis not present

## 2022-11-04 DIAGNOSIS — R921 Mammographic calcification found on diagnostic imaging of breast: Secondary | ICD-10-CM | POA: Diagnosis not present

## 2022-11-04 DIAGNOSIS — H0102A Squamous blepharitis right eye, upper and lower eyelids: Secondary | ICD-10-CM | POA: Diagnosis not present

## 2022-11-04 DIAGNOSIS — H35372 Puckering of macula, left eye: Secondary | ICD-10-CM | POA: Diagnosis not present

## 2022-11-04 DIAGNOSIS — N6011 Diffuse cystic mastopathy of right breast: Secondary | ICD-10-CM | POA: Diagnosis not present

## 2022-11-04 HISTORY — PX: BREAST BIOPSY: SHX20

## 2022-11-08 ENCOUNTER — Other Ambulatory Visit: Payer: Self-pay | Admitting: Family Medicine

## 2022-12-17 ENCOUNTER — Other Ambulatory Visit: Payer: Self-pay | Admitting: Family Medicine

## 2023-02-10 DIAGNOSIS — M79671 Pain in right foot: Secondary | ICD-10-CM | POA: Diagnosis not present

## 2023-02-10 DIAGNOSIS — M79672 Pain in left foot: Secondary | ICD-10-CM | POA: Diagnosis not present

## 2023-02-19 DIAGNOSIS — M76821 Posterior tibial tendinitis, right leg: Secondary | ICD-10-CM | POA: Diagnosis not present

## 2023-02-19 DIAGNOSIS — M76822 Posterior tibial tendinitis, left leg: Secondary | ICD-10-CM | POA: Diagnosis not present

## 2023-03-05 ENCOUNTER — Telehealth: Payer: Self-pay | Admitting: Family Medicine

## 2023-03-05 NOTE — Telephone Encounter (Signed)
Contacted Kati Shafer to schedule their annual wellness visit. Appointment made for 03/13/2023.  Thank you,  [place] Direct dial  [phone]

## 2023-03-10 ENCOUNTER — Other Ambulatory Visit: Payer: Self-pay | Admitting: Family Medicine

## 2023-03-10 DIAGNOSIS — K219 Gastro-esophageal reflux disease without esophagitis: Secondary | ICD-10-CM

## 2023-03-11 ENCOUNTER — Other Ambulatory Visit: Payer: Self-pay

## 2023-03-13 ENCOUNTER — Ambulatory Visit (INDEPENDENT_AMBULATORY_CARE_PROVIDER_SITE_OTHER): Payer: PPO | Admitting: Family Medicine

## 2023-03-13 DIAGNOSIS — Z0000 Encounter for general adult medical examination without abnormal findings: Secondary | ICD-10-CM | POA: Diagnosis not present

## 2023-03-13 NOTE — Patient Instructions (Signed)
Veronica Martinez , Thank you for taking time to come for your Medicare Wellness Visit. I appreciate your ongoing commitment to your health goals. Please review the following plan we discussed and let me know if I can assist you in the future.   Screening recommendations/referrals: Colonoscopy: up to date Mammogram: up to date Bone Density:  Recommended yearly ophthalmology/optometry visit for glaucoma screening and checkup Recommended yearly dental visit for hygiene and checkup  Vaccinations: Influenza vaccine: up to date Pneumococcal vaccine: up to date Tdap vaccine: up to date Shingles vaccine: up to date    Advanced directives: Education provided       Preventive Care 28 Years and Older, Female Preventive care refers to lifestyle choices and visits with your health care provider that can promote health and wellness. What does preventive care include? A yearly physical exam. This is also called an annual well check. Dental exams once or twice a year. Routine eye exams. Ask your health care provider how often you should have your eyes checked. Personal lifestyle choices, including: Daily care of your teeth and gums. Regular physical activity. Eating a healthy diet. Avoiding tobacco and drug use. Limiting alcohol use. Practicing safe sex. Taking low-dose aspirin every day. Taking vitamin and mineral supplements as recommended by your health care provider. What happens during an annual well check? The services and screenings done by your health care provider during your annual well check will depend on your age, overall health, lifestyle risk factors, and family history of disease. Counseling  Your health care provider may ask you questions about your: Alcohol use. Tobacco use. Drug use. Emotional well-being. Home and relationship well-being. Sexual activity. Eating habits. History of falls. Memory and ability to understand (cognition). Work and work  Statistician. Reproductive health. Screening  You may have the following tests or measurements: Height, weight, and BMI. Blood pressure. Lipid and cholesterol levels. These may be checked every 5 years, or more frequently if you are over 62 years old. Skin check. Lung cancer screening. You may have this screening every year starting at age 69 if you have a 30-pack-year history of smoking and currently smoke or have quit within the past 15 years. Fecal occult blood test (FOBT) of the stool. You may have this test every year starting at age 60. Flexible sigmoidoscopy or colonoscopy. You may have a sigmoidoscopy every 5 years or a colonoscopy every 10 years starting at age 93. Hepatitis C blood test. Hepatitis B blood test. Sexually transmitted disease (STD) testing. Diabetes screening. This is done by checking your blood sugar (glucose) after you have not eaten for a while (fasting). You may have this done every 1-3 years. Bone density scan. This is done to screen for osteoporosis. You may have this done starting at age 7. Mammogram. This may be done every 1-2 years. Talk to your health care provider about how often you should have regular mammograms. Talk with your health care provider about your test results, treatment options, and if necessary, the need for more tests. Vaccines  Your health care provider may recommend certain vaccines, such as: Influenza vaccine. This is recommended every year. Tetanus, diphtheria, and acellular pertussis (Tdap, Td) vaccine. You may need a Td booster every 10 years. Zoster vaccine. You may need this after age 98. Pneumococcal 13-valent conjugate (PCV13) vaccine. One dose is recommended after age 23. Pneumococcal polysaccharide (PPSV23) vaccine. One dose is recommended after age 81. Talk to your health care provider about which screenings and vaccines you need and  how often you need them. This information is not intended to replace advice given to you by  your health care provider. Make sure you discuss any questions you have with your health care provider. Document Released: 01/05/2016 Document Revised: 08/28/2016 Document Reviewed: 10/10/2015 Elsevier Interactive Patient Education  2017 [place] Prevention in the Home Falls can cause injuries. They can happen to people of all ages. There are many things you can do to make your home safe and to help prevent falls. What can I do on the outside of my home? Regularly fix the edges of walkways and driveways and fix any cracks. Remove anything that might make you trip as you walk through a door, such as a raised step or threshold. Trim any bushes or trees on the path to your home. Use bright outdoor lighting. Clear any walking paths of anything that might make someone trip, such as rocks or tools. Regularly check to see if handrails are loose or broken. Make sure that both sides of any steps have handrails. Any raised decks and porches should have guardrails on the edges. Have any leaves, snow, or ice cleared regularly. Use sand or salt on walking paths during winter. Clean up any spills in your garage right away. This includes oil or grease spills. What can I do in the bathroom? Use night lights. Install grab bars by the toilet and in the tub and shower. Do not use towel bars as grab bars. Use non-skid mats or decals in the tub or shower. If you need to sit down in the shower, use a plastic, non-slip stool. Keep the floor dry. Clean up any water that spills on the floor as soon as it happens. Remove soap buildup in the tub or shower regularly. Attach bath mats securely with double-sided non-slip rug tape. Do not have throw rugs and other things on the floor that can make you trip. What can I do in the bedroom? Use night lights. Make sure that you have a light by your bed that is easy to reach. Do not use any sheets or blankets that are too big for your bed. They should not hang  down onto the floor. Have a firm chair that has side arms. You can use this for support while you get dressed. Do not have throw rugs and other things on the floor that can make you trip. What can I do in the kitchen? Clean up any spills right away. Avoid walking on wet floors. Keep items that you use a lot in easy-to-reach places. If you need to reach something above you, use a strong step stool that has a grab bar. Keep electrical cords out of the way. Do not use floor polish or wax that makes floors slippery. If you must use wax, use non-skid floor wax. Do not have throw rugs and other things on the floor that can make you trip. What can I do with my stairs? Do not leave any items on the stairs. Make sure that there are handrails on both sides of the stairs and use them. Fix handrails that are broken or loose. Make sure that handrails are as long as the stairways. Check any carpeting to make sure that it is firmly attached to the stairs. Fix any carpet that is loose or worn. Avoid having throw rugs at the top or bottom of the stairs. If you do have throw rugs, attach them to the floor with carpet tape. Make sure that you have  a light switch at the top of the stairs and the bottom of the stairs. If you do not have them, ask someone to add them for you. What else can I do to help prevent falls? Wear shoes that: Do not have high heels. Have rubber bottoms. Are comfortable and fit you well. Are closed at the toe. Do not wear sandals. If you use a stepladder: Make sure that it is fully opened. Do not climb a closed stepladder. Make sure that both sides of the stepladder are locked into place. Ask someone to hold it for you, if possible. Clearly mark and make sure that you can see: Any grab bars or handrails. First and last steps. Where the edge of each step is. Use tools that help you move around (mobility aids) if they are needed. These  include: Canes. Walkers. Scooters. Crutches. Turn on the lights when you go into a dark area. Replace any light bulbs as soon as they burn out. Set up your furniture so you have a clear path. Avoid moving your furniture around. If any of your floors are uneven, fix them. If there are any pets around you, be aware of where they are. Review your medicines with your doctor. Some medicines can make you feel dizzy. This can increase your chance of falling. Ask your doctor what other things that you can do to help prevent falls. This information is not intended to replace advice given to you by your health care provider. Make sure you discuss any questions you have with your health care provider. Document Released: 10/05/2009 Document Revised: 05/16/2016 Document Reviewed: 01/13/2015 Elsevier Interactive Patient Education  2017 [employer].

## 2023-03-13 NOTE — Progress Notes (Signed)
Subjective:   Veronica Martinez is a 67 y.o. female who presents for Medicare Annual (Subsequent) preventive examination.  I connected with  Luretta Gammel on 03/13/23 by a telephone enabled telemedicine application and verified that I am speaking with the correct person using two identifiers.   I discussed the limitations of evaluation and management by telemedicine. The patient expressed understanding and agreed to proceed.  Patient location: home  Provider location: home/telephone   Review of Systems     Cardiac Risk Factors include: advanced age (>6men, >46 women);obesity (BMI >30kg/m2)     Objective:    Today's Vitals   There is no height or weight on file to calculate BMI.     03/13/2023    8:31 AM 02/14/2022    3:55 PM 11/09/2020   10:55 AM 03/21/2020    1:23 PM 08/18/2019    9:49 AM 08/03/2019   10:10 AM 08/21/2017   11:43 AM  Advanced Directives  Does Patient Have a Medical Advance Directive? No No No No No No No  Would patient like information on creating a medical advance directive? No - Patient declined No - Patient declined Yes (ED - Information included in AVS) No - Patient declined Yes (MAU/Ambulatory/Procedural Areas - Information given) No - Patient declined Yes (MAU/Ambulatory/Procedural Areas - Information given)    Current Medications (verified) Outpatient Encounter Medications as of 03/13/2023  Medication Sig   acetaminophen (TYLENOL) 650 MG CR tablet Take 650 mg by mouth every 8 (eight) hours as needed for pain.   Azelastine HCl 137 MCG/SPRAY SOLN PLACE 2 SPRAYS INTO BOTH NOSTRILS 2 (TWO) TIMES DAILY. USE IN EACH NOSTRIL AS DIRECTED   Calcium Carbonate (CALCIUM 600 PO) Take by mouth.   cetirizine (ZYRTEC) 10 MG tablet 1 tablet   Cholecalciferol (VITAMIN D) 50 MCG (2000 UT) tablet Take 2,000 Units by mouth daily.   Coenzyme Q10 (CO Q 10 PO) Take 75 mg by mouth 3 (three) times daily.   fluticasone (FLONASE) 50 MCG/ACT nasal spray SPRAY 2  SPRAYS INTO EACH NOSTRIL EVERY DAY   Fluticasone Furoate 50 MCG/ACT AEPB    gabapentin (NEURONTIN) 600 MG tablet Take 1 tablet (600 mg total) by mouth 2 (two) times daily.   ibuprofen (ADVIL,MOTRIN) 600 MG tablet Take 1 tablet (600 mg total) by mouth every 8 (eight) hours as needed (with food).   Misc Natural Products (CALCIUM PLUS ADVANCED PO) Take 600 mg by mouth 2 (two) times daily.    Multiple Vitamins-Minerals (CENTRUM SILVER PO) Take by mouth.   Omega-3 Fatty Acids (FISH OIL) 1200 MG CAPS Take 1,200 mg by mouth 3 (three) times daily.    pantoprazole (PROTONIX) 40 MG tablet TAKE 1 TABLET BY MOUTH EVERY DAY IN THE MORNING   RA Vitamin E-Vit A & D 20000 units CREA 1 tablet with a meal   rosuvastatin (CRESTOR) 5 MG tablet 1 tab every night at 6pm   SYNTHROID 100 MCG tablet Take 1 tablet (100 mcg total) by mouth daily before breakfast.   vitamin E 400 UNIT capsule Take 400 Units by mouth daily.   No facility-administered encounter medications on file as of 03/13/2023.    Allergies (verified) Other, Meloxicam, Pravastatin sodium, Simvastatin, Tramadol, Tramadol hcl, and Tramadol hcl   History: Past Medical History:  Diagnosis Date   Allergy    GERD (gastroesophageal reflux disease)    Headache(784.0)    sinus   High cholesterol    History of Graves' disease    Hx of  radioactive iodine thyroid ablation    Hyperlipidemia    Phreesia 11/08/2020   Hypothyroidism    Sickle cell anemia ([place])    Phreesia 11/08/2020   Past Surgical History:  Procedure Laterality Date   ABDOMINAL HYSTERECTOMY     APPENDECTOMY     BREAST BIOPSY Right 11/04/2022   MM RT BREAST BX W LOC DEV 1ST LESION IMAGE BX SPEC STEREO GUIDE 11/04/2022 [facility]   CATARACT EXTRACTION W/PHACO Right 09/22/2013   Procedure: CATARACT EXTRACTION PHACO AND INTRAOCULAR LENS PLACEMENT ([place]);  Surgeon: [name], MD;  Location: [facility];  Service: Ophthalmology;  Laterality: Right;   CATARACT EXTRACTION W/PHACO Left  12/29/2013   Procedure: CATARACT EXTRACTION PHACO AND INTRAOCULAR LENS PLACEMENT ([facility]);  Surgeon: [name], MD;  Location: [facility];  Service: Ophthalmology;  Laterality: Left;   COLONOSCOPY     EYE SURGERY     HIP ARTHROPLASTY Bilateral    JOINT REPLACEMENT     OTHER SURGICAL HISTORY Right    carpal tunel injection   SHOULDER SURGERY     TONSILLECTOMY     Family History  Problem Relation Age of Onset   Hypertension Mother    Hyperlipidemia Mother    Diabetes Sister    Obesity Brother        gastric bypass 2018   Thyroid disease Sister    Obesity Sister    Social History   Socioeconomic History   Marital status: Married    Spouse name: Not on file   Number of children: Not on file   Years of education: Not on file   Highest education level: Not on file  Occupational History   Not on file  Tobacco Use   Smoking status: Never   Smokeless tobacco: Never  Substance and Sexual Activity   Alcohol use: No   Drug use: No   Sexual activity: Yes    Partners: Male  Other Topics Concern   Not on file  Social History Narrative   Married. Education: [employer].    Social Determinants of Health   Financial Resource Strain: Low Risk  (03/13/2023)   Overall Financial Resource Strain (CARDIA)    Difficulty of Paying Living Expenses: Not hard at all  Food Insecurity: No Food Insecurity (03/13/2023)   Hunger Vital Sign    Worried About Running Out of Food in the Last Year: Never true    Ran Out of Food in the Last Year: Never true  Transportation Needs: No Transportation Needs (03/13/2023)   PRAPARE - Hydrologist (Medical): No    Lack of Transportation (Non-Medical): No  Physical Activity: Sufficiently Active (03/13/2023)   Exercise Vital Sign    Days of Exercise per Week: 3 days    Minutes of Exercise per Session: 60 min  Stress: No Stress Concern Present (03/13/2023)   [place]     Feeling of Stress : Not at all  Social Connections: [place] (03/13/2023)   Social Connection and Isolation Panel [NHANES]    Frequency of Communication with Friends and Family: More than three times a week    Frequency of Social Gatherings with Friends and Family: More than three times a week    Attends Religious Services: More than 4 times per year    Active Member of [employer] or Organizations: Yes    Attends Music therapist: More than 4 times per year    Marital Status: Married  Tobacco Counseling Counseling given: Not Answered   Clinical Intake:  Pre-visit preparation completed: Yes  Pain : No/denies pain     Diabetes: No  How often do you need to have someone help you when you read instructions, pamphlets, or other written materials from your doctor or pharmacy?: 1 - Never  Diabetic? no  Interpreter Needed?: No  Information entered by :: Leroy Kennedy LPN   Activities of Daily Living    03/13/2023    8:37 AM 03/10/2023    9:41 PM  In your present state of health, do you have any difficulty performing the following activities:  Hearing? 0 0  Vision? 0 0  Difficulty concentrating or making decisions? 0 0  Walking or climbing stairs? 0 0  Dressing or bathing? 0 0  Doing errands, shopping? 1 0  Preparing Food and eating ? N N  Using the Toilet? N N  In the past six months, have you accidently leaked urine? N N  Do you have problems with loss of bowel control? N N  Managing your Medications? N N  Managing your Finances? N N  Housekeeping or managing your Housekeeping? N N    Patient Care Team: Wendie Agreste, MD as PCP - General (Family Medicine) Warden Fillers, MD as Consulting Physician (Ophthalmology) Ronald Lobo, MD as Consulting Physician (Gastroenterology)  Indicate any recent Medical Services you may have received from other than [facility] providers in the past year (date may be approximate).     Assessment:   This is a  routine wellness examination for [place].  Hearing/Vision screen Hearing Screening - Comments:: No trouble hearing Vision Screening - Comments:: Up to date [facility]  Dietary issues and exercise activities discussed: Current Exercise Habits: Structured exercise class, Type of exercise: strength training/weights;walking, Time (Minutes): 60, Frequency (Times/Week): 4, Weekly Exercise (Minutes/Week): 240, Intensity: Moderate   Goals Addressed             This Visit's Progress    Weight (lb) < 200 lb (90.7 kg)         Depression Screen    03/13/2023    8:40 AM 09/25/2022    2:05 PM 05/03/2022    9:47 AM 02/20/2022    9:43 AM 02/14/2022    3:55 PM 02/14/2022    3:53 PM 05/31/2021    1:53 PM  PHQ 2/9 Scores  PHQ - 2 Score 0 0 0 0 0 0 0  PHQ- 9 Score 0 0 0 0   0    Fall Risk    03/13/2023    8:32 AM 03/10/2023    9:41 PM 09/25/2022    2:06 PM 05/03/2022    9:47 AM 02/20/2022    9:43 AM  Fall Risk   Falls in the past year? 0 0 0 0 0  Number falls in past yr: 0 0 0 0 0  Injury with Fall? 0 0 0 0 0  Risk for fall due to :   No Fall Risks No Fall Risks No Fall Risks  Follow up Falls evaluation completed;Education provided;Falls prevention discussed  Falls evaluation completed Falls evaluation completed Falls evaluation completed    FALL RISK PREVENTION PERTAINING TO THE HOME:  Any stairs in or around the home? Yes  If so, are there any without handrails? No  Home free of loose throw rugs in walkways, pet beds, electrical cords, etc? Yes  Adequate lighting in your home to reduce risk of falls? Yes   ASSISTIVE DEVICES UTILIZED TO  PREVENT FALLS:  Life alert? No  Use of a cane, walker or w/c? No  Grab bars in the bathroom? Yes  Shower chair or bench in shower? Yes  Elevated toilet seat or a handicapped toilet? Yes   TIMED UP AND GO:  Was the test performed? No .    Cognitive Function:        03/13/2023    8:34 AM 11/09/2020   11:07 AM 11/09/2020   11:01 AM 08/18/2019    9:46  AM 08/03/2019   10:10 AM  6CIT Screen  What Year? 0 points 0 points 0 points 0 points 0 points  What month? 0 points 0 points 0 points 0 points 0 points  What time? 0 points  0 points 0 points 0 points  Count back from 20 2 points  0 points 0 points 0 points  Months in reverse 4 points  2 points 0 points 0 points  Repeat phrase 0 points  0 points 0 points 0 points  Total Score 6 points  2 points 0 points 0 points    Immunizations Immunization History  Administered Date(s) Administered   Fluad Quad(high Dose 65+) 08/18/2019, 09/21/2020   Influenza Split 09/01/2012   Influenza,inj,Quad PF,6+ Mos 09/07/2013, 01/10/2015, 12/26/2015, 08/21/2017, 02/15/2019   Influenza-Unspecified 10/24/2022   PFIZER(Purple Top)SARS-COV-2 Vaccination 02/05/2020, 03/01/2020, 09/25/2020   Pfizer Covid-19 Vaccine Bivalent Booster 5y-11y 10/24/2022   Pneumococcal Conjugate-13 08/18/2019   Pneumococcal Polysaccharide-23 09/25/2022   Tdap 01/10/2015   Zoster Recombinat (Shingrix) 08/18/2019, 06/22/2020    TDAP status: Up to date  Flu Vaccine status: Up to date  Pneumococcal vaccine status: Up to date  Covid-19 vaccine status: Information provided on how to obtain vaccines.   Qualifies for Shingles Vaccine? No   Zostavax completed Yes   Shingrix Completed?: Yes  Screening Tests Health Maintenance  Topic Date Due   MAMMOGRAM  10/25/2023   Medicare Annual Wellness (AWV)  03/12/2024   DTaP/Tdap/Td (2 - Td or Tdap) 01/10/2025   COLONOSCOPY (Pts 45-73yrs Insurance coverage will need to be confirmed)  10/25/2026   Pneumonia Vaccine 77+ Years old  Completed   INFLUENZA VACCINE  Completed   DEXA SCAN  Completed   Hepatitis C Screening  Completed   Zoster Vaccines- Shingrix  Completed   HPV VACCINES  Aged Out   COVID-19 Vaccine  Discontinued    Health Maintenance  There are no preventive care reminders to display for this patient.   Colorectal cancer screening: Type of screening: Colonoscopy.  Completed 2017. Repeat every 10 years  Mammogram status: Completed  . Repeat every year  Bone Density scheduled  Lung Cancer Screening: (Low Dose CT Chest recommended if Age 17-80 years, 30 pack-year currently smoking OR have quit w/in 15years.) does not qualify.   Lung Cancer Screening Referral:   Additional Screening:  Hepatitis C Screening: does not qualify; Completed 2018  Vision Screening: Recommended annual ophthalmology exams for early detection of glaucoma and other disorders of the eye. Is the patient up to date with their annual eye exam?  Yes  Who is the provider or what is the name of the office in which the patient attends annual eye exams? [facility] If pt is not established with a provider, would they like to be referred to a provider to establish care? No .   Dental Screening: Recommended annual dental exams for proper oral hygiene  Community Resource Referral / Chronic Care Management: CRR required this visit?  No   CCM required this visit?  No      Plan:     I have personally reviewed and noted the following in the patient's chart:   Medical and social history Use of alcohol, tobacco or illicit drugs  Current medications and supplements including opioid prescriptions. Patient is not currently taking opioid prescriptions. Functional ability and status Nutritional status Physical activity Advanced directives List of other physicians Hospitalizations, surgeries, and ER visits in previous 12 months Vitals Screenings to include cognitive, depression, and falls Referrals and appointments  In addition, I have reviewed and discussed with patient certain preventive protocols, quality metrics, and best practice recommendations. A written personalized care plan for preventive services as well as general preventive health recommendations were provided to patient.     Leroy Kennedy, LPN   [id number]   Nurse Notes:

## 2023-03-27 ENCOUNTER — Encounter: Payer: Self-pay | Admitting: Family Medicine

## 2023-03-27 ENCOUNTER — Ambulatory Visit (INDEPENDENT_AMBULATORY_CARE_PROVIDER_SITE_OTHER): Payer: PPO | Admitting: Family Medicine

## 2023-03-27 VITALS — BP 128/80 | HR 59 | Temp 97.9°F | Resp 17 | Ht 64.0 in | Wt 226.2 lb

## 2023-03-27 DIAGNOSIS — M792 Neuralgia and neuritis, unspecified: Secondary | ICD-10-CM

## 2023-03-27 DIAGNOSIS — E785 Hyperlipidemia, unspecified: Secondary | ICD-10-CM

## 2023-03-27 DIAGNOSIS — E039 Hypothyroidism, unspecified: Secondary | ICD-10-CM | POA: Diagnosis not present

## 2023-03-27 LAB — LIPID PANEL
Cholesterol: 173 mg/dL (ref 0–200)
HDL: 95.6 mg/dL (ref >39.00)
LDL Cholesterol: 67 mg/dL (ref 0–99)
NonHDL: 76.96
Total CHOL/HDL Ratio: 2
Triglycerides: 48 mg/dL (ref 0.0–149.0)
VLDL: 9.6 mg/dL (ref 0.0–40.0)

## 2023-03-27 LAB — COMPREHENSIVE METABOLIC PANEL
ALT: 18 U/L (ref 0–35)
AST: 22 U/L (ref 0–37)
Albumin: 4.9 g/dL (ref 3.5–5.2)
Alkaline Phosphatase: 79 U/L (ref 39–117)
BUN: 14 mg/dL (ref 6–23)
CO2: 32 mEq/L (ref 19–32)
Calcium: 11 mg/dL — ABNORMAL HIGH (ref 8.4–10.5)
Chloride: 103 mEq/L (ref 96–112)
Creatinine, Ser: 0.74 mg/dL (ref 0.40–1.20)
GFR: 82.83 mL/min (ref >60.00)
Glucose, Bld: 86 mg/dL (ref 70–99)
Potassium: 4.9 mEq/L (ref 3.5–5.1)
Sodium: 141 mEq/L (ref 135–145)
Total Bilirubin: 0.4 mg/dL (ref 0.2–1.2)
Total Protein: 7.8 g/dL (ref 6.0–8.3)

## 2023-03-27 LAB — TSH: TSH: 4.87 u[IU]/mL (ref 0.35–5.50)

## 2023-03-27 MED ORDER — SYNTHROID 100 MCG PO TABS: 100.0000 ug | ORAL_TABLET | Freq: Every day | ORAL | 1 refills | Status: DC

## 2023-03-27 MED ORDER — ROSUVASTATIN CALCIUM 5 MG PO TABS: ORAL_TABLET | ORAL | 1 refills | Status: DC

## 2023-03-27 MED ORDER — GABAPENTIN 600 MG PO TABS: 600.0000 mg | ORAL_TABLET | Freq: Two times a day (BID) | ORAL | 1 refills | Status: DC | PRN

## 2023-03-27 NOTE — Patient Instructions (Signed)
Keep up the good work with walking. Weight has improved. No med changes at this time. Take care!

## 2023-03-27 NOTE — Progress Notes (Signed)
Subjective:  Patient ID: Allyx Pinnix, female    DOB: 11-29-1956  Age: 67 y.o. MRN: [id number]  CC:  Chief Complaint  Patient presents with   Hyperlipidemia    6 month follow up  Doing well per pt     HPI Rohan Coslett presents for   Doing well, beach trip next week.   Hypothyroidism: Lab Results  Component Value Date   TSH 3.54 09/25/2022  Taking medication daily.  Synthroid 100 mcg daily No new hot or cold intolerance. No new hair or skin changes, heart palpitations or new fatigue. No new weight changes.   Meralgia paresthetica Discussed in October, still stable with gabapentin  - now stable on 1 per day. No side effects.   Hyperlipidemia: Crestor 5 mg daily, tolerating without new myalgias or side effects.  Prior issues with pravastatin, simvastatin. No new myalgias, or new side effects.  Walking 2.5 mi per day.  Wt Readings from Last 3 Encounters:  03/27/23 226 lb 4 oz (102.6 kg)  09/25/22 232 lb (105.2 kg)  05/03/22 231 lb 9.6 oz (105.1 kg)    Lab Results  Component Value Date   CHOL 170 09/25/2022   HDL 96.90 09/25/2022   LDLCALC 65 09/25/2022   TRIG 39.0 09/25/2022   CHOLHDL 2 09/25/2022   Lab Results  Component Value Date   ALT 19 09/25/2022   AST 28 09/25/2022   ALKPHOS 73 09/25/2022   BILITOT 0.5 09/25/2022    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/22/2013   Pain of right heel 12/08/2012   Myalgia 11/10/2012   OBESITY, UNSPECIFIED 02/20/2011    BACK PAIN, LUMBAR 07/05/2010   ABNORMALITY OF GAIT 05/02/2010   MERALGIA PARESTHETICA 03/02/2010   HIP PAIN, BILATERAL 03/02/2010   Past Medical History:  Diagnosis Date   Allergy    GERD (gastroesophageal reflux disease)    Headache(784.0)    sinus   High cholesterol    History of Graves' disease    Hx of radioactive iodine thyroid ablation    Hyperlipidemia    Phreesia 11/08/2020   Hypothyroidism    Sickle cell anemia    Phreesia 11/08/2020   Past Surgical History:  Procedure Laterality Date   ABDOMINAL HYSTERECTOMY     APPENDECTOMY     BREAST BIOPSY Right 11/04/2022   MM RT BREAST BX W LOC DEV 1ST LESION IMAGE BX SPEC STEREO GUIDE 11/04/2022 [facility]   CATARACT EXTRACTION W/PHACO Right 09/22/2013   Procedure: CATARACT EXTRACTION PHACO AND INTRAOCULAR LENS PLACEMENT ([place]);  Surgeon: [name], MD;  Location: [place];  Service: Ophthalmology;  Laterality: Right;   CATARACT EXTRACTION W/PHACO Left 12/29/2013   Procedure: CATARACT EXTRACTION PHACO AND INTRAOCULAR LENS PLACEMENT ([facility]);  Surgeon: [name], MD;  Location: [place] [facility];  Service: Ophthalmology;  Laterality: Left;   COLONOSCOPY     EYE SURGERY     HIP ARTHROPLASTY Bilateral    JOINT REPLACEMENT     OTHER SURGICAL HISTORY Right    carpal tunel injection   SHOULDER SURGERY  TONSILLECTOMY     Allergies  Allergen Reactions   Other    Meloxicam Other (See Comments)    Causes Bruising    Pravastatin Sodium Other (See Comments)    Bruises   Simvastatin Other (See Comments)    Bruises    Tramadol Other (See Comments)   Tramadol Hcl Other (See Comments)    Bruising    Tramadol Hcl Other (See Comments)   Prior to Admission medications   Medication Sig Start Date End Date Taking? Authorizing Provider  acetaminophen (TYLENOL) 650 MG CR tablet Take 650 mg by mouth every 8 (eight) hours as needed for pain.   Yes [provider]  Azelastine HCl 137 MCG/SPRAY SOLN PLACE 2 SPRAYS INTO BOTH  NOSTRILS 2 (TWO) TIMES DAILY. USE IN EACH NOSTRIL AS DIRECTED 08/09/22  Yes Midge Minium, MD  Calcium Carbonate (CALCIUM 600 PO) Take by mouth.   Yes [provider]  cetirizine (ZYRTEC) 10 MG tablet 1 tablet   Yes [provider]  Cholecalciferol (VITAMIN D) 50 MCG (2000 UT) tablet Take 2,000 Units by mouth daily.   Yes [provider]  Coenzyme Q10 (CO Q 10 PO) Take 75 mg by mouth 3 (three) times daily.   Yes [provider]  fluticasone (FLONASE) 50 MCG/ACT nasal spray SPRAY 2 SPRAYS INTO EACH NOSTRIL EVERY DAY 03/11/23  Yes Wendie Agreste, MD  Fluticasone Furoate 50 MCG/ACT AEPB    Yes [provider]  gabapentin (NEURONTIN) 600 MG tablet Take 1 tablet (600 mg total) by mouth 2 (two) times daily. 09/25/22  Yes Wendie Agreste, MD  ibuprofen (ADVIL,MOTRIN) 600 MG tablet Take 1 tablet (600 mg total) by mouth every 8 (eight) hours as needed (with food). 10/21/17  Yes Wendie Agreste, MD  Misc Natural Products (CALCIUM PLUS ADVANCED PO) Take 600 mg by mouth 2 (two) times daily.    Yes [provider]  Multiple Vitamins-Minerals (CENTRUM SILVER PO) Take by mouth.   Yes [provider]  Omega-3 Fatty Acids (FISH OIL) 1200 MG CAPS Take 1,200 mg by mouth 3 (three) times daily.    Yes [provider]  pantoprazole (PROTONIX) 40 MG tablet TAKE 1 TABLET BY MOUTH EVERY DAY IN THE MORNING 03/11/23  Yes Wendie Agreste, MD  RA Vitamin E-Vit A & D 20000 units CREA 1 tablet with a meal   Yes [provider]  rosuvastatin (CRESTOR) 5 MG tablet 1 tab every night at 6pm 09/25/22  Yes Wendie Agreste, MD  SYNTHROID 100 MCG tablet Take 1 tablet (100 mcg total) by mouth daily before breakfast. 09/25/22  Yes Wendie Agreste, MD  vitamin E 400 UNIT capsule Take 400 Units by mouth daily.   Yes [provider]   Social History   Socioeconomic History   Marital status: Married    Spouse name: Not on file   Number  of children: Not on file   Years of education: Not on file   Highest education level: Not on file  Occupational History   Not on file  Tobacco Use   Smoking status: Never   Smokeless tobacco: Never  Substance and Sexual Activity   Alcohol use: No   Drug use: No   Sexual activity: Yes    Partners: Male  Other Topics Concern   Not on file  Social History Narrative   Married. Education: [employer].    Social Determinants of Health   Financial Resource Strain: Low Risk  (  03/13/2023)   Overall Financial Resource Strain (CARDIA)    Difficulty of Paying Living Expenses: Not hard at all  Food Insecurity: No Food Insecurity (03/13/2023)   Hunger Vital Sign    Worried About Running Out of Food in the Last Year: Never true    Ran Out of Food in the Last Year: Never true  Transportation Needs: No Transportation Needs (03/13/2023)   PRAPARE - Hydrologist (Medical): No    Lack of Transportation (Non-Medical): No  Physical Activity: Sufficiently Active (03/13/2023)   Exercise Vital Sign    Days of Exercise per Week: 3 days    Minutes of Exercise per Session: 60 min  Stress: No Stress Concern Present (03/13/2023)   [place]    Feeling of Stress : Not at all  Social Connections: [place] (03/13/2023)   Social Connection and Isolation Panel [NHANES]    Frequency of Communication with Friends and Family: More than three times a week    Frequency of Social Gatherings with Friends and Family: More than three times a week    Attends Religious Services: More than 4 times per year    Active Member of [employer] or Organizations: Yes    Attends Archivist Meetings: More than 4 times per year    Marital Status: Married  Human resources officer Violence: Not At Risk (03/13/2023)   Humiliation, Afraid, Rape, and Kick questionnaire    Fear of Current or Ex-Partner: No    Emotionally Abused: No     Physically Abused: No    Sexually Abused: No    Review of Systems   Objective:   Vitals:   03/27/23 1256  BP: 128/80  Pulse: (!) 59  Resp: 17  Temp: 97.9 F (36.6 C)  TempSrc: Temporal  SpO2: 99%  Weight: 226 lb 4 oz (102.6 kg)  Height: 5\' 4"  (1.626 m)     Physical Exam Vitals reviewed.  Constitutional:      Appearance: Normal appearance. She is well-developed.  HENT:     Head: Normocephalic and atraumatic.  Eyes:     Conjunctiva/sclera: Conjunctivae normal.     Pupils: Pupils are equal, round, and reactive to light.  Neck:     Vascular: No carotid bruit.  Cardiovascular:     Rate and Rhythm: Normal rate and regular rhythm.     Heart sounds: Normal heart sounds.  Pulmonary:     Effort: Pulmonary effort is normal.     Breath sounds: Normal breath sounds.  Abdominal:     Palpations: Abdomen is soft. There is no pulsatile mass.     Tenderness: There is no abdominal tenderness.  Musculoskeletal:     Right lower leg: No edema.     Left lower leg: No edema.  Skin:    General: Skin is warm and dry.  Neurological:     Mental Status: She is alert and oriented to person, place, and time.  Psychiatric:        Mood and Affect: Mood normal.        Behavior: Behavior normal.      Assessment & Plan:  Cassy Shean is a 67 y.o. female . Hyperlipidemia, unspecified hyperlipidemia type - Plan: Comprehensive metabolic panel, Lipid panel, rosuvastatin (CRESTOR) 5 MG tablet  -Commended on walking, weight improvement, anticipate improved labs, continue Crestor same dose.  Check updated labs.  Hypothyroidism, unspecified type - Plan: TSH, SYNTHROID 100 MCG tablet  -  Stable, tolerating current regimen. Medications refilled. Labs pending as above.   Nerve pain - Plan: gabapentin (NEURONTIN) 600 MG tablet  -Stable on lower dose of gabapentin only 1/day, option of 1 to 2/day, refill ordered.  RTC precautions, 70-month follow-up for physical.   No orders of the  defined types were placed in this encounter.  Patient Instructions  Keep up the good work with walking. Weight has improved. No med changes at this time. Take care!     Signed,   [name], MD [place] 03/27/23 1:24 PM

## 2023-03-31 ENCOUNTER — Ambulatory Visit
Admission: RE | Admit: 2023-03-31 | Discharge: 2023-03-31 | Disposition: A | Discharge status: Home / Self Care | Payer: PPO | Source: Ambulatory Visit | Attending: Family Medicine | Admitting: Family Medicine

## 2023-03-31 DIAGNOSIS — Z780 Asymptomatic menopausal state: Secondary | ICD-10-CM | POA: Diagnosis not present

## 2023-03-31 DIAGNOSIS — E2839 Other primary ovarian failure: Secondary | ICD-10-CM

## 2023-03-31 DIAGNOSIS — M8588 Other specified disorders of bone density and structure, other site: Secondary | ICD-10-CM | POA: Diagnosis not present

## 2023-03-31 DIAGNOSIS — Z13820 Encounter for screening for osteoporosis: Secondary | ICD-10-CM

## 2023-08-20 DIAGNOSIS — N952 Postmenopausal atrophic vaginitis: Secondary | ICD-10-CM | POA: Diagnosis not present

## 2023-08-20 DIAGNOSIS — R102 Pelvic and perineal pain: Secondary | ICD-10-CM | POA: Diagnosis not present

## 2023-09-02 DIAGNOSIS — R102 Pelvic and perineal pain: Secondary | ICD-10-CM | POA: Diagnosis not present

## 2023-09-04 ENCOUNTER — Other Ambulatory Visit: Payer: Self-pay | Admitting: Family Medicine

## 2023-09-20 ENCOUNTER — Other Ambulatory Visit: Payer: Self-pay | Admitting: Family Medicine

## 2023-09-20 DIAGNOSIS — K219 Gastro-esophageal reflux disease without esophagitis: Secondary | ICD-10-CM

## 2023-09-20 DIAGNOSIS — U071 COVID-19: Secondary | ICD-10-CM | POA: Diagnosis not present

## 2023-09-21 ENCOUNTER — Other Ambulatory Visit: Payer: Self-pay | Admitting: Family Medicine

## 2023-09-21 DIAGNOSIS — E785 Hyperlipidemia, unspecified: Secondary | ICD-10-CM

## 2023-09-22 ENCOUNTER — Telehealth: Payer: Self-pay | Admitting: Family Medicine

## 2023-09-22 NOTE — Telephone Encounter (Signed)
Spoke with pt and she refused appointment today.

## 2023-09-22 NOTE — Telephone Encounter (Signed)
Patient Name First: Veronica Martinez Last: Rens Gender: Female DOB: 27-Jul-1956 Age: 67 Y 5 M 2 D Return Phone Number: [phone] (Primary), [phone] (Secondary) Address: City/ State/ Zip: [place] [address] Client [facility] Night - C Client Site [facility] - Night Provider Meredith Staggers- MD Contact Type Call Who Is Calling Patient / Member / Family / Caregiver Call Type Triage / Clinical Caller Name Ronda Fairly Relationship To Patient Daughter Return Phone Number [phone] (Primary) Chief Complaint Cough Reason for Call Symptomatic / Request for Health Information Initial Comment Caller that her mother tested positive for Covid yesterday. She has ongoing cough and would like a prescription. Translation No Nurse Assessment Nurse: Tennis Ship, RN, Clarisse Gouge Date/Time (Eastern Time): 09/21/2023 1:29:29 PM Confirm and document reason for call. If symptomatic, describe symptoms. ---Caller states went to doctor yesterday and tested positive for covid, taking paxlovid but requesting cough medicine. Does the patient have any new or worsening symptoms? ---Yes Will a triage be completed? ---Yes Related visit to physician within the last 2 weeks? ---Yes Does the PT have any chronic conditions? (i.e. diabetes, asthma, this includes High risk factors for pregnancy, etc.) ---Yes List chronic conditions. ---HLD, neuropathy, thyroid Is this a behavioral health or substance abuse call? ---No Guidelines Guideline Title Affirmed Question Affirmed Notes Nurse Date/Time Lamount Cohen Time) COVID-19 - Persisting Symptoms Follow-up Call [1] Persisting symptoms of COVID-19 AND [2] symptoms SAME AND [3] medical Lorenza Chick 09/21/2023 1:32:54 PM PLEASE NOTE: All timestamps contained within this report are represented as [place] Standard Time. CONFIDENTIALTY NOTICE: This fax transmission is intended only for the  addressee. It contains information that is legally privileged, confidential or otherwise protected from use or disclosure. If you are not the intended recipient, you are strictly prohibited from reviewing, disclosing, copying using or disseminating any of this information or taking any action in reliance on or regarding this information. If you have received this fax in error, please notify us immediately by telephone so that we can arrange for its return to [place]. Phone: [phone], Toll-Free: [phone], Fax: [phone] Page: 2 of 2 Call Id: [id number] Guidelines Guideline Title Affirmed Question Affirmed Notes Nurse Date/Time Lamount Cohen Time) visit for COVID-19 in past 2 weeks Disp. Time Lamount Cohen Time) Disposition Final User 09/21/2023 1:40:51 PM See PCP within 2 Weeks Yes Tennis Ship, RN, Clarisse Gouge Final Disposition 09/21/2023 1:40:51 PM See PCP within 2 Weeks Yes Tennis Ship, RN, Thayer Dallas Disagree/Comply Comply Caller Understands Yes PreDisposition Call Doctor Care Advice Given Per Guideline SEE PCP WITHIN 2 WEEKS: * You need to be seen for this ongoing problem within the next 2 weeks. CALL BACK IF: * You become worse * HOME REMEDY - HONEY: This old home remedy has been shown to help decrease coughing at night. The adult dosage is 2 teaspoons (10 ml) at bedtime. * COUGH SYRUP WITH DEXTROMETHORPHAN: An over-the-counter cough syrup can help your cough. The most common cough suppressant in over-the-counter cough medicines is dextromethorphan. COUGH MEDICINES: Comments User: Shanon Rosser, RN Date/Time Lamount Cohen Time): 09/21/2023 1:41:58 PM Caller advised to check with UC she was seen at yesterday for cough med today, but if unable to get to contact office in am. Caller verbalized understanding. Referrals REFERRED TO PCP OFFICE

## 2023-09-29 ENCOUNTER — Ambulatory Visit (INDEPENDENT_AMBULATORY_CARE_PROVIDER_SITE_OTHER): Payer: PPO | Admitting: Family Medicine

## 2023-09-29 VITALS — BP 132/78 | HR 79 | Temp 98.0°F | Ht 63.25 in | Wt 228.2 lb

## 2023-09-29 DIAGNOSIS — R7303 Prediabetes: Secondary | ICD-10-CM

## 2023-09-29 DIAGNOSIS — K219 Gastro-esophageal reflux disease without esophagitis: Secondary | ICD-10-CM

## 2023-09-29 DIAGNOSIS — E039 Hypothyroidism, unspecified: Secondary | ICD-10-CM

## 2023-09-29 DIAGNOSIS — E785 Hyperlipidemia, unspecified: Secondary | ICD-10-CM

## 2023-09-29 DIAGNOSIS — Z0000 Encounter for general adult medical examination without abnormal findings: Secondary | ICD-10-CM

## 2023-09-29 DIAGNOSIS — M792 Neuralgia and neuritis, unspecified: Secondary | ICD-10-CM | POA: Diagnosis not present

## 2023-09-29 DIAGNOSIS — Z23 Encounter for immunization: Secondary | ICD-10-CM | POA: Diagnosis not present

## 2023-09-29 LAB — POCT GLYCOSYLATED HEMOGLOBIN (HGB A1C): Hemoglobin A1C: 5.5 % (ref 4.0–5.6)

## 2023-09-29 NOTE — Progress Notes (Unsigned)
Subjective:  Patient ID: Veronica Martinez, female    DOB: 27-Jun-1956  Age: 67 y.o. MRN: [id number]  CC:  Chief Complaint  Patient presents with   Annual Exam    Pt is doing well no concerns     HPI Veronica Martinez presents for Annual Exam PCP, me Gynecology, Dr. Lowell Guitar, Pap and mammogram through GYN. Plans for eval for uterine abnormality? Pain - will be seen through provider at [facility] - not sure if scar tissue. Appt with GYN for pap and mammogram in few days.   Hypothyroidism: Lab Results  Component Value Date   TSH 4.87 03/27/2023   Taking medication daily.  Synthroid 100 mcg daily No new hot or cold intolerance. No new hair or skin changes, heart palpitations or new fatigue. No new weight changes.   Meralgia paresthetica Treated with gabapentin 2/day. Still working well   Hyperlipidemia: Crestor 5 mg daily, no new myalgias or side effects.  Lab Results  Component Value Date   CHOL 173 03/27/2023   HDL 95.60 03/27/2023   LDLCALC 67 03/27/2023   TRIG 48.0 03/27/2023   CHOLHDL 2 03/27/2023   Lab Results  Component Value Date   ALT 18 03/27/2023   AST 22 03/27/2023   ALKPHOS 79 03/27/2023   BILITOT 0.4 03/27/2023    GERD Treated with avoidance of trigger foods as well as Protonix 40 mg, intermittent dosing. Still doing well with this approach.   Prediabetes: Overall stable A1c last year compared to 5.9 in 2022.  Diet/exercise approach.  No prescription meds. Normal lipids in April. Lab Results  Component Value Date   HGBA1C 5.8 09/25/2022   Wt Readings from Last 3 Encounters:  09/29/23 228 lb 3.2 oz (103.5 kg)  03/27/23 226 lb 4 oz (102.6 kg)  09/25/22 232 lb (105.2 kg)        03/27/2023   12:56 PM 03/13/2023    8:40 AM 09/25/2022    2:05 PM 05/03/2022    9:47 AM 02/20/2022    9:43 AM  Depression screen PHQ 2/9  Decreased Interest 0 0 0 0 0  Down, Depressed, Hopeless 0 0 0 0 0  PHQ - 2 Score 0 0 0 0 0  Altered sleeping 0 0 0 0 0   Tired, decreased energy 0 0 0 0 0  Change in appetite 0 0 0 0 0  Feeling bad or failure about yourself  0 0 0 0 0  Trouble concentrating 0 0 0 0 0  Moving slowly or fidgety/restless 0 0 0 0 0  Suicidal thoughts 0 0 0 0 0  PHQ-9 Score 0 0 0 0 0  Difficult doing work/chores  Not difficult at all  Not difficult at all Not difficult at all    Health Maintenance  Topic Date Due   INFLUENZA VACCINE  07/24/2023   MAMMOGRAM  10/25/2023   Medicare Annual Wellness (AWV)  03/12/2024   DTaP/Tdap/Td (2 - Td or Tdap) 01/10/2025   Colonoscopy  10/25/2026   Pneumonia Vaccine 67+ Years old  Completed   DEXA SCAN  Completed   Hepatitis C Screening  Completed   Zoster Vaccines- Shingrix  Completed   HPV VACCINES  Aged Out   COVID-19 Vaccine  Discontinued  Colonoscopy July 2023 with [facility], repeat at 5 years.  Mammogram November 2023, right breast biopsy indicated benign breast tissues with fibroma toyed changes, fibrocystic changes including cystic dilation of ducts and adenosis on the right.  Annual screening mammography recommended.  Appt soon as above.  Bone density 03/31/2023, osteopenia without osteoporosis.  T-score of -1.1 at AP spine.  Calcium, vitamin D supplementation discussed.  Immunization History  Administered Date(s) Administered   Fluad Quad(high Dose 65+) 08/18/2019, 09/21/2020   Influenza Split 09/01/2012   Influenza,inj,Quad PF,6+ Mos 09/07/2013, 01/10/2015, 12/26/2015, 08/21/2017, 02/15/2019   Influenza-Unspecified 10/24/2022   PFIZER(Purple Top)SARS-COV-2 Vaccination 02/05/2020, 03/01/2020, 09/25/2020   Pfizer Covid-19 Vaccine Bivalent Booster 5y-11y 10/24/2022   Pneumococcal Conjugate-13 08/18/2019   Pneumococcal Polysaccharide-23 09/25/2022   Tdap 01/10/2015   Zoster Recombinant(Shingrix) 08/18/2019, 06/22/2020  Covid booster - planned at pharmacy.  Flu vaccine today.   No results found. Appt with optho in November, wears glasses. Vision stable.   Dental:appt 10/16.    Alcohol: none  Tobacco: none  Exercise: walking 3-4 miles per day.  Wt Readings from Last 3 Encounters:  09/29/23 228 lb 3.2 oz (103.5 kg)  03/27/23 226 lb 4 oz (102.6 kg)  09/25/22 232 lb (105.2 kg)   Some HA and allergy symptoms - better with mucinex. Plans allergy med otc and follow up if not continuing to improve.   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/22/2013   Pain of right heel 12/08/2012   Myalgia 11/10/2012   OBESITY, UNSPECIFIED 02/20/2011   BACK PAIN, LUMBAR 07/05/2010   ABNORMALITY OF GAIT 05/02/2010   MERALGIA PARESTHETICA 03/02/2010   HIP PAIN, BILATERAL 03/02/2010   Past Medical History:  Diagnosis Date   Allergy    GERD (gastroesophageal reflux disease)    Headache(784.0)    sinus   High cholesterol    History of Graves' disease    Hx of radioactive iodine thyroid ablation    Hyperlipidemia    Phreesia 11/08/2020   Hypothyroidism    Sickle cell anemia (HCC)    Phreesia 11/08/2020   Past Surgical History:  Procedure Laterality Date   ABDOMINAL HYSTERECTOMY     APPENDECTOMY     BREAST BIOPSY Right 11/04/2022   MM RT BREAST BX W LOC DEV 1ST LESION IMAGE BX SPEC STEREO GUIDE 11/04/2022 [facility]   CATARACT EXTRACTION W/PHACO Right 09/22/2013   Procedure: CATARACT EXTRACTION PHACO AND INTRAOCULAR LENS PLACEMENT (IOC);  Surgeon: Shade Flood, MD;  Location[facility] OR;  Service: Ophthalmology;  Laterality: Right;   CATARACT EXTRACTION W/PHACO Left 12/29/2013    Procedure: CATARACT EXTRACTION PHACO AND INTRAOCULAR LENS PLACEMENT (IOC);  Surgeon: Shade Flood, MD;  Location: [facility];  Service: Ophthalmology;  Laterality: Left;   COLONOSCOPY     EYE SURGERY     HIP ARTHROPLASTY Bilateral    JOINT REPLACEMENT     OTHER SURGICAL HISTORY Right    carpal tunel injection   SHOULDER SURGERY     TONSILLECTOMY     Allergies  Allergen Reactions   Other    Meloxicam Other (See Comments)    Causes Bruising    Pravastatin Sodium Other (See Comments)    Bruises   Simvastatin Other (See Comments)    Bruises    Tramadol Other (See Comments)   Tramadol Hcl Other (See Comments)  Bruising    Tramadol Hcl Other (See Comments)   Prior to Admission medications   Medication Sig Start Date End Date Taking? Authorizing Provider  acetaminophen (TYLENOL) 650 MG CR tablet Take 650 mg by mouth every 8 (eight) hours as needed for pain.    [provider]  Azelastine HCl 137 MCG/SPRAY SOLN PLACE 2 SPRAYS INTO BOTH NOSTRILS 2 (TWO) TIMES DAILY. USE IN EACH NOSTRIL AS DIRECTED 08/09/22   Sheliah Hatch, MD  Calcium Carbonate (CALCIUM 600 PO) Take by mouth.    [provider]  cetirizine (ZYRTEC) 10 MG tablet 1 tablet    [provider]  Cholecalciferol (VITAMIN D) 50 MCG (2000 UT) tablet Take 2,000 Units by mouth daily.    [provider]  Coenzyme Q10 (CO Q 10 PO) Take 75 mg by mouth 3 (three) times daily.    [provider]  fluticasone (FLONASE) 50 MCG/ACT nasal spray SPRAY 2 SPRAYS INTO EACH NOSTRIL EVERY DAY 09/04/23   Shade Flood, MD  Fluticasone Furoate 50 MCG/ACT AEPB     [provider]  gabapentin (NEURONTIN) 600 MG tablet Take 1 tablet (600 mg total) by mouth 2 (two) times daily as needed. 03/27/23   Shade Flood, MD  ibuprofen (ADVIL,MOTRIN) 600 MG tablet Take 1 tablet (600 mg total) by mouth every 8 (eight) hours as needed (with food). 10/21/17   Shade Flood, MD  Misc Natural Products  (CALCIUM PLUS ADVANCED PO) Take 600 mg by mouth 2 (two) times daily.     [provider]  Multiple Vitamins-Minerals (CENTRUM SILVER PO) Take by mouth.    [provider]  Omega-3 Fatty Acids (FISH OIL) 1200 MG CAPS Take 1,200 mg by mouth 3 (three) times daily.     [provider]  pantoprazole (PROTONIX) 40 MG tablet TAKE 1 TABLET BY MOUTH EVERY DAY IN THE MORNING 09/22/23   Shade Flood, MD  RA Vitamin E-Vit A & D 20000 units CREA 1 tablet with a meal    [provider]  rosuvastatin (CRESTOR) 5 MG tablet 1 TAB EVERY NIGHT AT 6PM 09/22/23   Shade Flood, MD  SYNTHROID 100 MCG tablet Take 1 tablet (100 mcg total) by mouth daily before breakfast. 03/27/23   Shade Flood, MD  vitamin E 400 UNIT capsule Take 400 Units by mouth daily.    [provider]   Social History   Socioeconomic History   Marital status: Married    Spouse name: Not on file   Number of children: Not on file   Years of education: Not on file   Highest education level: Not on file  Occupational History   Not on file  Tobacco Use   Smoking status: Never   Smokeless tobacco: Never  Substance and Sexual Activity   Alcohol use: No   Drug use: No   Sexual activity: Yes    Partners: Male  Other Topics Concern   Not on file  Social History Narrative   Married. Education: [employer].    Social Determinants of Health   Financial Resource Strain: Low Risk  (03/13/2023)   Overall Financial Resource Strain (CARDIA)    Difficulty of Paying Living Expenses: Not hard at all  Food Insecurity: No Food Insecurity (03/13/2023)   Hunger Vital Sign    Worried About Running Out of Food in the Last Year: Never true    Ran Out of Food in the Last Year: Never true  Transportation  Needs: No Transportation Needs (03/13/2023)   PRAPARE - Administrator, Civil Service (Medical): No    Lack of Transportation (Non-Medical): No  Physical Activity: Sufficiently Active  (03/13/2023)   Exercise Vital Sign    Days of Exercise per Week: 3 days    Minutes of Exercise per Session: 60 min  Stress: No Stress Concern Present (03/13/2023)   [employer] of Occupational Health - Occupational Stress Questionnaire    Feeling of Stress : Not at all  Social Connections: Socially Integrated (03/13/2023)   Social Connection and Isolation Panel [NHANES]    Frequency of Communication with Friends and Family: More than three times a week    Frequency of Social Gatherings with Friends and Family: More than three times a week    Attends Religious Services: More than 4 times per year    Active Member of [employer] or Organizations: Yes    Attends Banker Meetings: More than 4 times per year    Marital Status: Married  Catering manager Violence: Not At Risk (03/13/2023)   Humiliation, Afraid, Rape, and Kick questionnaire    Fear of Current or Ex-Partner: No    Emotionally Abused: No    Physically Abused: No    Sexually Abused: No    Review of Systems 13 point review of systems per patient health survey noted.  Negative other than as indicated above or in HPI.    Objective:   Vitals:   09/29/23 1305  BP: 132/78  Pulse: 79  Temp: 98 F (36.7 C)  TempSrc: Temporal  SpO2: 95%  Weight: 228 lb 3.2 oz (103.5 kg)  Height: 5' 3.25" (1.607 m)     Physical Exam Vitals reviewed.  Constitutional:      Appearance: She is well-developed.  HENT:     Head: Normocephalic and atraumatic.     Right Ear: External ear normal.     Left Ear: External ear normal.  Eyes:     Conjunctiva/sclera: Conjunctivae normal.     Pupils: Pupils are equal, round, and reactive to light.  Neck:     Thyroid: No thyromegaly.  Cardiovascular:     Rate and Rhythm: Normal rate and regular rhythm.     Heart sounds: Normal heart sounds. No murmur heard. Pulmonary:     Effort: Pulmonary effort is normal. No respiratory distress.     Breath sounds: Normal breath sounds. No wheezing.   Abdominal:     General: Bowel sounds are normal.     Palpations: Abdomen is soft.     Tenderness: There is no abdominal tenderness.  Musculoskeletal:        General: No tenderness. Normal range of motion.     Cervical back: Normal range of motion and neck supple.  Lymphadenopathy:     Cervical: No cervical adenopathy.  Skin:    General: Skin is warm and dry.     Findings: No rash.  Neurological:     Mental Status: She is alert and oriented to person, place, and time.  Psychiatric:        Behavior: Behavior normal.        Thought Content: Thought content normal.    Results for orders placed or performed in visit on 09/29/23  POCT glycosylated hemoglobin (Hb A1C)  Result Value Ref Range   Hemoglobin A1C 5.5 4.0 - 5.6 %   HbA1c POC (<> result, manual entry)     HbA1c, POC (prediabetic range)     HbA1c, POC (controlled  diabetic range)       Assessment & Plan:  Veronica Martinez is a 67 y.o. female . Annual physical exam  - -anticipatory guidance as below in AVS, screening labs above. Health maintenance items as above in HPI discussed/recommended as applicable.   -Most recent labs noted, point-of-care A1c only for physical at this time.  Recheck in 6 months.  Fasting labs at that time.  Need for influenza vaccination - Plan: Flu Vaccine Trivalent High Dose (Fluad)  Hypothyroidism, unspecified type  -As above, stable for some time on her current regimen, check labs next visit  Prediabetes - Plan: POCT glycosylated hemoglobin (Hb A1C) Continue watch diet/exercise, updated labs ordered with improved A1c.  Recheck 6 months.  Gastroesophageal reflux disease, unspecified whether esophagitis present  -Stable with treatment as above, continue same.  Nerve pain  -Stable control meralgia paresthetica with gabapentin, continue same.  Hyperlipidemia, unspecified hyperlipidemia type  -Crestor 5 mg daily.  Continue same.  Fasting labs next visit.  No orders of the defined types  were placed in this encounter.  Patient Instructions  I recommend at least 1200-1500mg  calclium per day and 1000units of Vitamin D per day with repeat bone density in 2 years.     Signed,   Veronica Staggers, MD [facility], [facility] 09/29/23 1:44 PM

## 2023-09-29 NOTE — Patient Instructions (Addendum)
I recommend at least 1200-1500mg  calclium per day and 1000units of Vitamin D per day with repeat bone density in 2 years.  I will check the prediabetes test today but other labs were stable back in April and can check those again next visit.  Keep up the good work with walking.  Follow-up with gynecology and other specialist as planned.  Let me know if there are questions and take care!  Preventive Care 10 Years and Older, Female Preventive care refers to lifestyle choices and visits with your health care provider that can promote health and wellness. Preventive care visits are also called wellness exams. What can I expect for my preventive care visit? Counseling Your health care provider may ask you questions about your: Medical history, including: Past medical problems. Family medical history. Pregnancy and menstrual history. History of falls. Current health, including: Memory and ability to understand (cognition). Emotional well-being. Home life and relationship well-being. Sexual activity and sexual health. Lifestyle, including: Alcohol, nicotine or tobacco, and drug use. Access to firearms. Diet, exercise, and sleep habits. Work and work Astronomer. Sunscreen use. Safety issues such as seatbelt and bike helmet use. Physical exam Your health care provider will check your: Height and weight. These may be used to calculate your BMI (body mass index). BMI is a measurement that tells if you are at a healthy weight. Waist circumference. This measures the distance around your waistline. This measurement also tells if you are at a healthy weight and may help predict your risk of certain diseases, such as type 2 diabetes and high blood pressure. Heart rate and blood pressure. Body temperature. Skin for abnormal spots. What immunizations do I need?  Vaccines are usually given at various ages, according to a schedule. Your health care provider will recommend vaccines for you based on  your age, medical history, and lifestyle or other factors, such as travel or where you work. What tests do I need? Screening Your health care provider may recommend screening tests for certain conditions. This may include: Lipid and cholesterol levels. Hepatitis C test. Hepatitis B test. HIV (human immunodeficiency virus) test. STI (sexually transmitted infection) testing, if you are at risk. Lung cancer screening. Colorectal cancer screening. Diabetes screening. This is done by checking your blood sugar (glucose) after you have not eaten for a while (fasting). Mammogram. Talk with your health care provider about how often you should have regular mammograms. BRCA-related cancer screening. This may be done if you have a family history of breast, ovarian, tubal, or peritoneal cancers. Bone density scan. This is done to screen for osteoporosis. Talk with your health care provider about your test results, treatment options, and if necessary, the need for more tests. Follow these instructions at home: Eating and drinking  Eat a diet that includes fresh fruits and vegetables, whole grains, lean protein, and low-fat dairy products. Limit your intake of foods with high amounts of sugar, saturated fats, and salt. Take vitamin and mineral supplements as recommended by your health care provider. Do not drink alcohol if your health care provider tells you not to drink. If you drink alcohol: Limit how much you have to 0-1 drink a day. Know how much alcohol is in your drink. In the [place] one drink equals one 12 oz bottle of beer (355 mL), one 5 oz glass of wine (148 mL), or one 1 oz glass of hard liquor (44 mL). Lifestyle Brush your teeth every morning and night with fluoride toothpaste. Floss one time each day. Exercise  for at least 30 minutes 5 or more days each week. Do not use any products that contain nicotine or tobacco. These products include cigarettes, chewing tobacco, and vaping devices,  such as e-cigarettes. If you need help quitting, ask your health care provider. Do not use drugs. If you are sexually active, practice safe sex. Use a condom or other form of protection in order to prevent STIs. Take aspirin only as told by your health care provider. Make sure that you understand how much to take and what form to take. Work with your health care provider to find out whether it is safe and beneficial for you to take aspirin daily. Ask your health care provider if you need to take a cholesterol-lowering medicine (statin). Find healthy ways to manage stress, such as: Meditation, yoga, or listening to music. Journaling. Talking to a trusted person. Spending time with friends and family. Minimize exposure to UV radiation to reduce your risk of skin cancer. Safety Always wear your seat belt while driving or riding in a vehicle. Do not drive: If you have been drinking alcohol. Do not ride with someone who has been drinking. When you are tired or distracted. While texting. If you have been using any mind-altering substances or drugs. Wear a helmet and other protective equipment during sports activities. If you have firearms in your house, make sure you follow all gun safety procedures. What's next? Visit your health care provider once a year for an annual wellness visit. Ask your health care provider how often you should have your eyes and teeth checked. Stay up to date on all vaccines. This information is not intended to replace advice given to you by your health care provider. Make sure you discuss any questions you have with your health care provider. Document Revised: 06/06/2021 Document Reviewed: 06/06/2021 Elsevier Patient Education  2024 [employer].

## 2023-09-30 ENCOUNTER — Encounter: Payer: Self-pay | Admitting: Family Medicine

## 2023-10-01 DIAGNOSIS — N952 Postmenopausal atrophic vaginitis: Secondary | ICD-10-CM | POA: Diagnosis not present

## 2023-10-01 DIAGNOSIS — Z90711 Acquired absence of uterus with remaining cervical stump: Secondary | ICD-10-CM | POA: Diagnosis not present

## 2023-10-01 DIAGNOSIS — Z1231 Encounter for screening mammogram for malignant neoplasm of breast: Secondary | ICD-10-CM | POA: Diagnosis not present

## 2023-10-01 DIAGNOSIS — Z01419 Encounter for gynecological examination (general) (routine) without abnormal findings: Secondary | ICD-10-CM | POA: Diagnosis not present

## 2023-10-01 DIAGNOSIS — R102 Pelvic and perineal pain: Secondary | ICD-10-CM | POA: Diagnosis not present

## 2023-10-01 DIAGNOSIS — Z1211 Encounter for screening for malignant neoplasm of colon: Secondary | ICD-10-CM | POA: Diagnosis not present

## 2023-10-01 DIAGNOSIS — Z13820 Encounter for screening for osteoporosis: Secondary | ICD-10-CM | POA: Diagnosis not present

## 2023-10-01 DIAGNOSIS — Z1331 Encounter for screening for depression: Secondary | ICD-10-CM | POA: Diagnosis not present

## 2023-10-01 DIAGNOSIS — Z139 Encounter for screening, unspecified: Secondary | ICD-10-CM | POA: Diagnosis not present

## 2023-11-07 DIAGNOSIS — R0981 Nasal congestion: Secondary | ICD-10-CM | POA: Diagnosis not present

## 2023-11-07 DIAGNOSIS — J019 Acute sinusitis, unspecified: Secondary | ICD-10-CM | POA: Diagnosis not present

## 2023-11-07 DIAGNOSIS — J40 Bronchitis, not specified as acute or chronic: Secondary | ICD-10-CM | POA: Diagnosis not present

## 2023-11-07 DIAGNOSIS — R051 Acute cough: Secondary | ICD-10-CM | POA: Diagnosis not present

## 2023-11-12 DIAGNOSIS — H10413 Chronic giant papillary conjunctivitis, bilateral: Secondary | ICD-10-CM | POA: Diagnosis not present

## 2023-11-12 DIAGNOSIS — Z961 Presence of intraocular lens: Secondary | ICD-10-CM | POA: Diagnosis not present

## 2023-11-12 DIAGNOSIS — H43813 Vitreous degeneration, bilateral: Secondary | ICD-10-CM | POA: Diagnosis not present

## 2023-11-12 DIAGNOSIS — H0102B Squamous blepharitis left eye, upper and lower eyelids: Secondary | ICD-10-CM | POA: Diagnosis not present

## 2023-11-12 DIAGNOSIS — H26491 Other secondary cataract, right eye: Secondary | ICD-10-CM | POA: Diagnosis not present

## 2023-11-12 DIAGNOSIS — H35372 Puckering of macula, left eye: Secondary | ICD-10-CM | POA: Diagnosis not present

## 2023-11-12 DIAGNOSIS — H0102A Squamous blepharitis right eye, upper and lower eyelids: Secondary | ICD-10-CM | POA: Diagnosis not present

## 2023-11-12 DIAGNOSIS — H04123 Dry eye syndrome of bilateral lacrimal glands: Secondary | ICD-10-CM | POA: Diagnosis not present

## 2023-11-12 DIAGNOSIS — H0264 Xanthelasma of left upper eyelid: Secondary | ICD-10-CM | POA: Diagnosis not present

## 2023-11-27 DIAGNOSIS — N958 Other specified menopausal and perimenopausal disorders: Secondary | ICD-10-CM | POA: Diagnosis not present

## 2023-11-27 DIAGNOSIS — M62838 Other muscle spasm: Secondary | ICD-10-CM | POA: Diagnosis not present

## 2023-11-27 DIAGNOSIS — M545 Low back pain, unspecified: Secondary | ICD-10-CM | POA: Diagnosis not present

## 2023-11-27 DIAGNOSIS — R102 Pelvic and perineal pain: Secondary | ICD-10-CM | POA: Diagnosis not present

## 2023-11-27 DIAGNOSIS — M25551 Pain in right hip: Secondary | ICD-10-CM | POA: Diagnosis not present

## 2023-12-18 ENCOUNTER — Other Ambulatory Visit: Payer: Self-pay | Admitting: Family Medicine

## 2023-12-18 DIAGNOSIS — M792 Neuralgia and neuritis, unspecified: Secondary | ICD-10-CM

## 2023-12-18 DIAGNOSIS — E039 Hypothyroidism, unspecified: Secondary | ICD-10-CM

## 2024-03-09 ENCOUNTER — Ambulatory Visit (INDEPENDENT_AMBULATORY_CARE_PROVIDER_SITE_OTHER): Admitting: *Deleted

## 2024-03-09 DIAGNOSIS — Z0000 Encounter for general adult medical examination without abnormal findings: Secondary | ICD-10-CM

## 2024-03-09 NOTE — Progress Notes (Signed)
 Subjective:   Veronica Martinez is a 68 y.o. female who presents for Medicare Annual (Subsequent) preventive examination.  Visit Complete: Virtual I connected with  Veronica Martinez on 03/09/24 by a audio enabled telemedicine application and verified that I am speaking with the correct person using two identifiers.  Patient Location: Home  Provider Location: Home Office  I discussed the limitations of evaluation and management by telemedicine. The patient expressed understanding and agreed to proceed.  Vital Signs: Because this visit was a virtual/telehealth visit, some criteria may be missing or patient reported. Any vitals not documented were not able to be obtained and vitals that have been documented are patient reported.   Cardiac Risk Factors include: advanced age (>53men, >59 women)     Objective:    There were no vitals filed for this visit. There is no height or weight on file to calculate BMI.     03/09/2024    2:21 PM 03/13/2023    8:31 AM 02/14/2022    3:55 PM 11/09/2020   10:55 AM 03/21/2020    1:23 PM 08/18/2019    9:49 AM 08/03/2019   10:10 AM  Advanced Directives  Does Patient Have a Medical Advance Directive? No No No No No No No  Would patient like information on creating a medical advance directive? No - Patient declined No - Patient declined No - Patient declined Yes (ED - Information included in AVS) No - Patient declined Yes (MAU/Ambulatory/Procedural Areas - Information given) No - Patient declined    Current Medications (verified) Outpatient Encounter Medications as of 03/09/2024  Medication Sig   acetaminophen (TYLENOL) 650 MG CR tablet Take 650 mg by mouth every 8 (eight) hours as needed for pain.   Azelastine HCl 137 MCG/SPRAY SOLN PLACE 2 SPRAYS INTO BOTH NOSTRILS 2 (TWO) TIMES DAILY. USE IN EACH NOSTRIL AS DIRECTED   Calcium Carbonate (CALCIUM 600 PO) Take by mouth.   cetirizine (ZYRTEC) 10 MG tablet 1 tablet   Cholecalciferol (VITAMIN D)  50 MCG (2000 UT) tablet Take 2,000 Units by mouth daily.   Coenzyme Q10 (CO Q 10 PO) Take 75 mg by mouth 3 (three) times daily.   fluticasone (FLONASE) 50 MCG/ACT nasal spray SPRAY 2 SPRAYS INTO EACH NOSTRIL EVERY DAY   Fluticasone Furoate 50 MCG/ACT AEPB    gabapentin (NEURONTIN) 600 MG tablet TAKE 1 TABLET (600 MG TOTAL) BY MOUTH 2 (TWO) TIMES DAILY AS NEEDED.   ibuprofen (ADVIL,MOTRIN) 600 MG tablet Take 1 tablet (600 mg total) by mouth every 8 (eight) hours as needed (with food).   Misc Natural Products (CALCIUM PLUS ADVANCED PO) Take 600 mg by mouth 2 (two) times daily.    Multiple Vitamins-Minerals (CENTRUM SILVER PO) Take by mouth.   Omega-3 Fatty Acids (FISH OIL) 1200 MG CAPS Take 1,200 mg by mouth 3 (three) times daily.    pantoprazole (PROTONIX) 40 MG tablet TAKE 1 TABLET BY MOUTH EVERY DAY IN THE MORNING   RA Vitamin E-Vit A & D 20000 units CREA 1 tablet with a meal   rosuvastatin (CRESTOR) 5 MG tablet 1 TAB EVERY NIGHT AT 6PM   SYNTHROID 100 MCG tablet TAKE 1 TABLET BY MOUTH DAILY BEFORE BREAKFAST.   vitamin E 400 UNIT capsule Take 400 Units by mouth daily.   No facility-administered encounter medications on file as of 03/09/2024.    Allergies (verified) Other, Meloxicam, Pravastatin sodium, Simvastatin, Tramadol, Tramadol hcl, and Tramadol hcl   History: Past Medical History:  Diagnosis Date  Allergy    GERD (gastroesophageal reflux disease)    Headache(784.0)    sinus   High cholesterol    History of Graves' disease    Hx of radioactive iodine thyroid ablation    Hyperlipidemia    Phreesia 11/08/2020   Hypothyroidism    Sickle cell anemia (HCC)    Phreesia 11/08/2020   Past Surgical History:  Procedure Laterality Date   ABDOMINAL HYSTERECTOMY     APPENDECTOMY     BREAST BIOPSY Right 11/04/2022   MM RT BREAST BX W LOC DEV 1ST LESION IMAGE BX SPEC STEREO GUIDE 11/04/2022 [facility]   CATARACT EXTRACTION W/PHACO Right 09/22/2013   Procedure: CATARACT  EXTRACTION PHACO AND INTRAOCULAR LENS PLACEMENT (IOC);  Surgeon: Shade Flood, MD;  Location: [facility] OR;  Service: Ophthalmology;  Laterality: Right;   CATARACT EXTRACTION W/PHACO Left 12/29/2013   Procedure: CATARACT EXTRACTION PHACO AND INTRAOCULAR LENS PLACEMENT (IOC);  Surgeon: Shade Flood, MD;  Location: [facility] OR;  Service: Ophthalmology;  Laterality: Left;   COLONOSCOPY     EYE SURGERY     HIP ARTHROPLASTY Bilateral    JOINT REPLACEMENT     OTHER SURGICAL HISTORY Right    carpal tunel injection   SHOULDER SURGERY     TONSILLECTOMY     Family History  Problem Relation Age of Onset   Hypertension Mother    Hyperlipidemia Mother    Diabetes Sister    Obesity Brother        gastric bypass 2018   Thyroid disease Sister    Obesity Sister    Social History   Socioeconomic History   Marital status: Married    Spouse name: Not on file   Number of children: Not on file   Years of education: Not on file   Highest education level: Not on file  Occupational History   Not on file  Tobacco Use   Smoking status: Never   Smokeless tobacco: Never  Substance and Sexual Activity   Alcohol use: No   Drug use: No   Sexual activity: Yes    Partners: Male  Other Topics Concern   Not on file  Social History Narrative   Married. Education: [employer].    Social Drivers of Corporate investment banker Strain: Low Risk  (03/09/2024)   Overall Financial Resource Strain (CARDIA)    Difficulty of Paying Living Expenses: Not hard at all  Food Insecurity: No Food Insecurity (03/09/2024)   Hunger Vital Sign    Worried About Running Out of Food in the Last Year: Never true    Ran Out of Food in the Last Year: Never true  Transportation Needs: No Transportation Needs (03/09/2024)   PRAPARE - Administrator, Civil Service (Medical): No    Lack of Transportation (Non-Medical): No  Physical Activity: Insufficiently Active (03/09/2024)   Exercise Vital Sign    Days of Exercise per Week: 4  days    Minutes of Exercise per Session: 30 min  Stress: No Stress Concern Present (03/09/2024)   [employer] of Occupational Health - Occupational Stress Questionnaire    Feeling of Stress : Not at all  Social Connections: Socially Integrated (03/09/2024)   Social Connection and Isolation Panel [NHANES]    Frequency of Communication with Friends and Family: More than three times a week    Frequency of Social Gatherings with Friends and Family: More than three times a week    Attends Religious Services: More than 4 times per year  Active Member of Clubs or Organizations: Yes    Attends Engineer, structural: More than 4 times per year    Marital Status: Married    Tobacco Counseling Counseling given: Not Answered   Clinical Intake:  Pre-visit preparation completed: Yes  Pain : No/denies pain     Diabetes: No  How often do you need to have someone help you when you read instructions, pamphlets, or other written materials from your doctor or pharmacy?: 1 - Never  Interpreter Needed?: No  Information entered by :: Remi Haggard LPN   Activities of Daily Living    03/09/2024    2:22 PM 03/13/2023    8:37 AM  In your present state of health, do you have any difficulty performing the following activities:  Hearing? 0 0  Vision? 0 0  Difficulty concentrating or making decisions? 0 0  Walking or climbing stairs? 0 0  Dressing or bathing? 0 0  Doing errands, shopping? 0 1  Preparing Food and eating ? N N  Using the Toilet? N N  In the past six months, have you accidently leaked urine? Y N  Do you have problems with loss of bowel control? N N  Managing your Medications? N N  Managing your Finances? N N  Housekeeping or managing your Housekeeping? N N    Patient Care Team: Shade Flood, MD as PCP - General (Family Medicine) Sallye Lat, MD as Consulting Physician (Ophthalmology) Bernette Redbird, MD as Consulting Physician  (Gastroenterology)  Indicate any recent Medical Services you may have received from other than [facility] providers in the past year (date may be approximate).     Assessment:   This is a routine wellness examination for [place].  Hearing/Vision screen Hearing Screening - Comments:: No trouble hearing Vision Screening - Comments:: Up to date [facility]   Goals Addressed             This Visit's Progress    Patient Stated       Keep exercise        Depression Screen    03/09/2024    2:26 PM 03/27/2023   12:56 PM 03/13/2023    8:40 AM 09/25/2022    2:05 PM 05/03/2022    9:47 AM 02/20/2022    9:43 AM 02/14/2022    3:55 PM  PHQ 2/9 Scores  PHQ - 2 Score 0 0 0 0 0 0 0  PHQ- 9 Score 0 0 0 0 0 0     Fall Risk    03/09/2024    2:19 PM 09/29/2023    1:07 PM 03/27/2023   12:56 PM 03/13/2023    8:32 AM 03/10/2023    9:41 PM  Fall Risk   Falls in the past year? 0 0 1 0 0  Number falls in past yr: 0 0 1 0 0  Injury with Fall? 0 0 0 0 0  Risk for fall due to :  No Fall Risks History of fall(s)    Follow up Education provided;Falls prevention discussed;Falls evaluation completed Falls evaluation completed Falls evaluation completed Falls evaluation completed;Education provided;Falls prevention discussed     MEDICARE RISK AT HOME: Medicare Risk at Home Any stairs in or around the home?: Yes If so, are there any without handrails?: No Home free of loose throw rugs in walkways, pet beds, electrical cords, etc?: Yes Adequate lighting in your home to reduce risk of falls?: Yes Life alert?: No Use of a cane, walker or w/c?: No Grab  bars in the bathroom?: Yes Shower chair or bench in shower?: Yes Elevated toilet seat or a handicapped toilet?: Yes  TIMED UP AND GO:  Was the test performed?  No    Cognitive Function:        03/09/2024    2:22 PM 03/13/2023    8:34 AM 11/09/2020   11:07 AM 11/09/2020   11:01 AM 08/18/2019    9:46 AM  6CIT Screen  What Year? 0 points 0 points 0 points 0  points 0 points  What month? 0 points 0 points 0 points 0 points 0 points  What time? 0 points 0 points  0 points 0 points  Count back from 20 0 points 2 points  0 points 0 points  Months in reverse 0 points 4 points  2 points 0 points  Repeat phrase 0 points 0 points  0 points 0 points  Total Score 0 points 6 points  2 points 0 points    Immunizations Immunization History  Administered Date(s) Administered   Fluad Quad(high Dose 65+) 08/18/2019, 09/21/2020   Fluad Trivalent(High Dose 65+) 09/29/2023   Influenza Split 09/01/2012   Influenza,inj,Quad PF,6+ Mos 09/07/2013, 01/10/2015, 12/26/2015, 08/21/2017, 02/15/2019   Influenza-Unspecified 10/24/2022   PFIZER(Purple Top)SARS-COV-2 Vaccination 02/05/2020, 03/01/2020, 09/25/2020   Pfizer Covid-19 Vaccine Bivalent Booster 5y-11y 10/24/2022   Pfizer(Comirnaty)Fall Seasonal Vaccine 12 years and older 10/17/2023   Pneumococcal Conjugate-13 08/18/2019   Pneumococcal Polysaccharide-23 09/25/2022   Tdap 01/10/2015   Zoster Recombinant(Shingrix) 08/18/2019, 06/22/2020    TDAP status: Up to date  Flu Vaccine status: Up to date  Pneumococcal vaccine status: Up to date  Covid-19 vaccine status: Information provided on how to obtain vaccines.   Qualifies for Shingles Vaccine? No   Zostavax completed Yes   Shingrix Completed?: Yes  Screening Tests Health Maintenance  Topic Date Due   MAMMOGRAM  10/25/2023   DTaP/Tdap/Td (2 - Td or Tdap) 01/10/2025   Medicare Annual Wellness (AWV)  03/09/2025   Colonoscopy  10/25/2026   Pneumonia Vaccine 55+ Years old  Completed   INFLUENZA VACCINE  Completed   DEXA SCAN  Completed   Hepatitis C Screening  Completed   Zoster Vaccines- Shingrix  Completed   HPV VACCINES  Aged Out   COVID-19 Vaccine  Discontinued    Health Maintenance  Health Maintenance Due  Topic Date Due   MAMMOGRAM  10/25/2023    Colorectal cancer screening: Type of screening: Colonoscopy. Completed 2017. Repeat every  10 years  Mammogram status: Completed  . Repeat every year  Bone Density status: Completed 2024. Results reflect: Bone density results: NORMAL. Repeat every 0 years. 0 Lung Cancer Screening: (Low Dose CT Chest recommended if Age 37-80 years, 20 pack-year currently smoking OR have quit w/in 15years.) does not qualify.   Lung Cancer Screening Referral:   Additional Screening:  Hepatitis C Screening: does not qualify; Completed 2018  Vision Screening: Recommended annual ophthalmology exams for early detection of glaucoma and other disorders of the eye. Is the patient up to date with their annual eye exam?  Yes  Who is the provider or what is the name of the office in which the patient attends annual eye exams? [facility] If pt is not established with a provider, would they like to be referred to a provider to establish care? No .   Dental Screening: Recommended annual dental exams for proper oral hygiene    Community Resource Referral / Chronic Care Management: CRR required this visit?  No   CCM  required this visit?  No     Plan:     I have personally reviewed and noted the following in the patient's chart:   Medical and social history Use of alcohol, tobacco or illicit drugs  Current medications and supplements including opioid prescriptions. Patient is not currently taking opioid prescriptions. Functional ability and status Nutritional status Physical activity Advanced directives List of other physicians Hospitalizations, surgeries, and ER visits in previous 12 months Vitals Screenings to include cognitive, depression, and falls Referrals and appointments  In addition, I have reviewed and discussed with patient certain preventive protocols, quality metrics, and best practice recommendations. A written personalized care plan for preventive services as well as general preventive health recommendations were provided to patient.     Remi Haggard, LPN   1/61/0960   After  Visit Summary: (MyChart) Due to this being a telephonic visit, the after visit summary with patients personalized plan was offered to patient via MyChart   Nurse Notes:

## 2024-03-09 NOTE — Patient Instructions (Signed)
 Veronica Martinez , Thank you for taking time to come for your Medicare Wellness Visit. I appreciate your ongoing commitment to your health goals. Please review the following plan we discussed and let me know if I can assist you in the future.   Screening recommendations/referrals: Colonoscopy: up to date Mammogram: up to date Bone Density: up to date Recommended yearly ophthalmology/optometry visit for glaucoma screening and checkup Recommended yearly dental visit for hygiene and checkup  Vaccinations: Influenza vaccine: up to date Pneumococcal vaccine: up to date Tdap vaccine:  up to date      Preventive Care 65 Years and Older, Female Preventive care refers to lifestyle choices and visits with your health care provider that can promote health and wellness. What does preventive care include? A yearly physical exam. This is also called an annual well check. Dental exams once or twice a year. Routine eye exams. Ask your health care provider how often you should have your eyes checked. Personal lifestyle choices, including: Daily care of your teeth and gums. Regular physical activity. Eating a healthy diet. Avoiding tobacco and drug use. Limiting alcohol use. Practicing safe sex. Taking low-dose aspirin every day. Taking vitamin and mineral supplements as recommended by your health care provider. What happens during an annual well check? The services and screenings done by your health care provider during your annual well check will depend on your age, overall health, lifestyle risk factors, and family history of disease. Counseling  Your health care provider may ask you questions about your: Alcohol use. Tobacco use. Drug use. Emotional well-being. Home and relationship well-being. Sexual activity. Eating habits. History of falls. Memory and ability to understand (cognition). Work and work Astronomer. Reproductive health. Screening  You may have the following tests or  measurements: Height, weight, and BMI. Blood pressure. Lipid and cholesterol levels. These may be checked every 5 years, or more frequently if you are over 1 years old. Skin check. Lung cancer screening. You may have this screening every year starting at age 61 if you have a 30-pack-year history of smoking and currently smoke or have quit within the past 15 years. Fecal occult blood test (FOBT) of the stool. You may have this test every year starting at age 81. Flexible sigmoidoscopy or colonoscopy. You may have a sigmoidoscopy every 5 years or a colonoscopy every 10 years starting at age 67. Hepatitis C blood test. Hepatitis B blood test. Sexually transmitted disease (STD) testing. Diabetes screening. This is done by checking your blood sugar (glucose) after you have not eaten for a while (fasting). You may have this done every 1-3 years. Bone density scan. This is done to screen for osteoporosis. You may have this done starting at age 23. Mammogram. This may be done every 1-2 years. Talk to your health care provider about how often you should have regular mammograms. Talk with your health care provider about your test results, treatment options, and if necessary, the need for more tests. Vaccines  Your health care provider may recommend certain vaccines, such as: Influenza vaccine. This is recommended every year. Tetanus, diphtheria, and acellular pertussis (Tdap, Td) vaccine. You may need a Td booster every 10 years. Zoster vaccine. You may need this after age 94. Pneumococcal 13-valent conjugate (PCV13) vaccine. One dose is recommended after age 27. Pneumococcal polysaccharide (PPSV23) vaccine. One dose is recommended after age 80. Talk to your health care provider about which screenings and vaccines you need and how often you need them. This information is not intended  to replace advice given to you by your health care provider. Make sure you discuss any questions you have with your  health care provider. Document Released: 01/05/2016 Document Revised: 08/28/2016 Document Reviewed: 10/10/2015 Elsevier Interactive Patient Education  2017 [employer].  Fall Prevention in the Home Falls can cause injuries. They can happen to people of all ages. There are many things you can do to make your home safe and to help prevent falls. What can I do on the outside of my home? Regularly fix the edges of walkways and driveways and fix any cracks. Remove anything that might make you trip as you walk through a door, such as a raised step or threshold. Trim any bushes or trees on the path to your home. Use bright outdoor lighting. Clear any walking paths of anything that might make someone trip, such as rocks or tools. Regularly check to see if handrails are loose or broken. Make sure that both sides of any steps have handrails. Any raised decks and porches should have guardrails on the edges. Have any leaves, snow, or ice cleared regularly. Use sand or salt on walking paths during winter. Clean up any spills in your garage right away. This includes oil or grease spills. What can I do in the bathroom? Use night lights. Install grab bars by the toilet and in the tub and shower. Do not use towel bars as grab bars. Use non-skid mats or decals in the tub or shower. If you need to sit down in the shower, use a plastic, non-slip stool. Keep the floor dry. Clean up any water that spills on the floor as soon as it happens. Remove soap buildup in the tub or shower regularly. Attach bath mats securely with double-sided non-slip rug tape. Do not have throw rugs and other things on the floor that can make you trip. What can I do in the bedroom? Use night lights. Make sure that you have a light by your bed that is easy to reach. Do not use any sheets or blankets that are too big for your bed. They should not hang down onto the floor. Have a firm chair that has side arms. You can use this for  support while you get dressed. Do not have throw rugs and other things on the floor that can make you trip. What can I do in the kitchen? Clean up any spills right away. Avoid walking on wet floors. Keep items that you use a lot in easy-to-reach places. If you need to reach something above you, use a strong step stool that has a grab bar. Keep electrical cords out of the way. Do not use floor polish or wax that makes floors slippery. If you must use wax, use non-skid floor wax. Do not have throw rugs and other things on the floor that can make you trip. What can I do with my stairs? Do not leave any items on the stairs. Make sure that there are handrails on both sides of the stairs and use them. Fix handrails that are broken or loose. Make sure that handrails are as long as the stairways. Check any carpeting to make sure that it is firmly attached to the stairs. Fix any carpet that is loose or worn. Avoid having throw rugs at the top or bottom of the stairs. If you do have throw rugs, attach them to the floor with carpet tape. Make sure that you have a light switch at the top of the stairs and  the bottom of the stairs. If you do not have them, ask someone to add them for you. What else can I do to help prevent falls? Wear shoes that: Do not have high heels. Have rubber bottoms. Are comfortable and fit you well. Are closed at the toe. Do not wear sandals. If you use a stepladder: Make sure that it is fully opened. Do not climb a closed stepladder. Make sure that both sides of the stepladder are locked into place. Ask someone to hold it for you, if possible. Clearly mark and make sure that you can see: Any grab bars or handrails. First and last steps. Where the edge of each step is. Use tools that help you move around (mobility aids) if they are needed. These include: Canes. Walkers. Scooters. Crutches. Turn on the lights when you go into a dark area. Replace any light bulbs as soon  as they burn out. Set up your furniture so you have a clear path. Avoid moving your furniture around. If any of your floors are uneven, fix them. If there are any pets around you, be aware of where they are. Review your medicines with your doctor. Some medicines can make you feel dizzy. This can increase your chance of falling. Ask your doctor what other things that you can do to help prevent falls. This information is not intended to replace advice given to you by your health care provider. Make sure you discuss any questions you have with your health care provider. Document Released: 10/05/2009 Document Revised: 05/16/2016 Document Reviewed: 01/13/2015 Elsevier Interactive Patient Education  2017 [employer].

## 2024-03-29 ENCOUNTER — Encounter: Payer: Self-pay | Admitting: Family Medicine

## 2024-03-29 ENCOUNTER — Ambulatory Visit (INDEPENDENT_AMBULATORY_CARE_PROVIDER_SITE_OTHER): Payer: PPO | Admitting: Family Medicine

## 2024-03-29 VITALS — BP 126/78 | HR 71 | Temp 98.3°F | Ht 63.25 in | Wt 216.2 lb

## 2024-03-29 DIAGNOSIS — R7303 Prediabetes: Secondary | ICD-10-CM | POA: Diagnosis not present

## 2024-03-29 DIAGNOSIS — E785 Hyperlipidemia, unspecified: Secondary | ICD-10-CM | POA: Diagnosis not present

## 2024-03-29 DIAGNOSIS — M792 Neuralgia and neuritis, unspecified: Secondary | ICD-10-CM | POA: Diagnosis not present

## 2024-03-29 DIAGNOSIS — K219 Gastro-esophageal reflux disease without esophagitis: Secondary | ICD-10-CM | POA: Diagnosis not present

## 2024-03-29 DIAGNOSIS — E039 Hypothyroidism, unspecified: Secondary | ICD-10-CM | POA: Diagnosis not present

## 2024-03-29 LAB — COMPREHENSIVE METABOLIC PANEL WITH GFR
ALT: 19 U/L (ref 0–35)
AST: 20 U/L (ref 0–37)
Albumin: 4.9 g/dL (ref 3.5–5.2)
Alkaline Phosphatase: 78 U/L (ref 39–117)
BUN: 10 mg/dL (ref 6–23)
CO2: 31 meq/L (ref 19–32)
Calcium: 10.3 mg/dL (ref 8.4–10.5)
Chloride: 105 meq/L (ref 96–112)
Creatinine, Ser: 0.78 mg/dL (ref 0.40–1.20)
GFR: 77.21 mL/min
Glucose, Bld: 81 mg/dL (ref 70–99)
Potassium: 4.6 meq/L (ref 3.5–5.1)
Sodium: 143 meq/L (ref 135–145)
Total Bilirubin: 0.4 mg/dL (ref 0.2–1.2)
Total Protein: 7.9 g/dL (ref 6.0–8.3)

## 2024-03-29 LAB — LIPID PANEL
Cholesterol: 155 mg/dL (ref 0–200)
HDL: 89.1 mg/dL (ref >39.00)
LDL Cholesterol: 54 mg/dL (ref 0–99)
NonHDL: 66.22
Total CHOL/HDL Ratio: 2
Triglycerides: 63 mg/dL (ref 0.0–149.0)
VLDL: 12.6 mg/dL (ref 0.0–40.0)

## 2024-03-29 LAB — HEMOGLOBIN A1C: Hgb A1c MFr Bld: 5.7 % (ref 4.6–6.5)

## 2024-03-29 LAB — TSH: TSH: 2.12 u[IU]/mL (ref 0.35–5.50)

## 2024-03-29 MED ORDER — ROSUVASTATIN CALCIUM 5 MG PO TABS: ORAL_TABLET | ORAL | 1 refills | Status: DC

## 2024-03-29 MED ORDER — SYNTHROID 100 MCG PO TABS: 100.0000 ug | ORAL_TABLET | Freq: Every day | ORAL | 1 refills | Status: DC

## 2024-03-29 MED ORDER — GABAPENTIN 600 MG PO TABS: 600.0000 mg | ORAL_TABLET | Freq: Two times a day (BID) | ORAL | 1 refills | Status: DC | PRN

## 2024-03-29 MED ORDER — PANTOPRAZOLE SODIUM 40 MG PO TBEC: 40.0000 mg | DELAYED_RELEASE_TABLET | Freq: Every day | ORAL | 1 refills | Status: DC | PRN

## 2024-03-29 NOTE — Progress Notes (Signed)
 Subjective:  Patient ID: Veronica Martinez, female    DOB: 04-Mar-1956  Age: 68 y.o. MRN: [id number]  CC:  Chief Complaint  Patient presents with   Medical Management of Chronic Issues    Pt is well today no concerns per patient.     HPI Veronica Martinez presents for   Hypothyroidism: Lab Results  Component Value Date   TSH 4.87 03/27/2023  Synthroid 100 mcg daily. Taking medication daily.  No new hot or cold intolerance. No new hair or skin changes, heart palpitations or new fatigue. No new weight changes. Intentional weight loss with exercise.   Meralgia paresthetica Stable with gabapentin 2/day.  Denies any side effects or change in control.stable without new side effects.   Hyperlipidemia: Crestor 5 mg daily, no mew myalgias/side effects.  Lab Results  Component Value Date   CHOL 173 03/27/2023   HDL 95.60 03/27/2023   LDLCALC 67 03/27/2023   TRIG 48.0 03/27/2023   CHOLHDL 2 03/27/2023   Lab Results  Component Value Date   ALT 18 03/27/2023   AST 22 03/27/2023   ALKPHOS 79 03/27/2023   BILITOT 0.4 03/27/2023   GERD Intermittent Protonix dosing along with avoidance of trigger foods has been effective.   Prediabetes: Diet/exercise approach with overall stable A1c in October. Weight improved with walking. 4 miles per day.  Lab Results  Component Value Date   HGBA1C 5.5 09/29/2023   Wt Readings from Last 3 Encounters:  03/29/24 216 lb 3.2 oz (98.1 kg)  09/29/23 228 lb 3.2 oz (103.5 kg)  03/27/23 226 lb 4 oz (102.6 kg)   Health maintenance: Mammogram, due.  Biopsy in 2023.  Fibrocystic changes, fibromatoid change.  Annual screening mammography recommended. Had mammogram in 10/01/2023 - at [facility], requesting record.   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 08/10/2013   Trigger finger, acquired 04/22/2013   Carpal tunnel syndrome of right wrist 04/22/2013   Pain of right heel 12/08/2012   Myalgia 11/10/2012   OBESITY, UNSPECIFIED 02/20/2011   BACK PAIN, LUMBAR 07/05/2010   ABNORMALITY OF GAIT 05/02/2010   MERALGIA PARESTHETICA 03/02/2010   HIP PAIN, BILATERAL 03/02/2010   Past Medical History:  Diagnosis Date   Allergy    GERD (gastroesophageal reflux disease)    Headache(784.0)    sinus   High cholesterol    History of Graves' disease    Hx of radioactive iodine thyroid ablation    Hyperlipidemia    Phreesia 11/08/2020   Hypothyroidism    Sickle cell anemia (HCC)    Phreesia 11/08/2020   Past Surgical History:  Procedure Laterality Date   ABDOMINAL HYSTERECTOMY     APPENDECTOMY     BREAST BIOPSY Right 11/04/2022   MM RT BREAST BX W LOC DEV 1ST LESION IMAGE BX SPEC STEREO GUIDE 11/04/2022 [facility]   CATARACT EXTRACTION W/PHACO Right 09/22/2013   Procedure: CATARACT EXTRACTION PHACO AND INTRAOCULAR LENS PLACEMENT (IOC);  Surgeon: Shade Flood, MD;  Location: [facility] OR;  Service: Ophthalmology;  Laterality: Right;   CATARACT EXTRACTION W/PHACO Left 12/29/2013   Procedure: CATARACT EXTRACTION PHACO AND INTRAOCULAR LENS PLACEMENT (IOC);  Surgeon: Shade Flood,  MD;  Location: [facility] OR;  Service: Ophthalmology;  Laterality: Left;   COLONOSCOPY     EYE SURGERY     HIP ARTHROPLASTY Bilateral    JOINT REPLACEMENT     OTHER SURGICAL HISTORY Right    carpal tunel injection   SHOULDER SURGERY     TONSILLECTOMY     Allergies  Allergen Reactions   Other    Meloxicam Other (See Comments)    Causes Bruising    Pravastatin Sodium Other (See Comments)    Bruises   Simvastatin Other (See Comments)    Bruises    Tramadol Other  (See Comments)   Tramadol Hcl Other (See Comments)    Bruising    Tramadol Hcl Other (See Comments)   Prior to Admission medications   Medication Sig Start Date End Date Taking? Authorizing Provider  acetaminophen (TYLENOL) 650 MG CR tablet Take 650 mg by mouth every 8 (eight) hours as needed for pain.   Yes [provider]  Azelastine HCl 137 MCG/SPRAY SOLN PLACE 2 SPRAYS INTO BOTH NOSTRILS 2 (TWO) TIMES DAILY. USE IN EACH NOSTRIL AS DIRECTED 08/09/22  Yes Sheliah Hatch, MD  Calcium Carbonate (CALCIUM 600 PO) Take by mouth.   Yes [provider]  cetirizine (ZYRTEC) 10 MG tablet 1 tablet   Yes [provider]  Cholecalciferol (VITAMIN D) 50 MCG (2000 UT) tablet Take 2,000 Units by mouth daily.   Yes [provider]  Coenzyme Q10 (CO Q 10 PO) Take 75 mg by mouth 3 (three) times daily.   Yes [provider]  fluticasone (FLONASE) 50 MCG/ACT nasal spray SPRAY 2 SPRAYS INTO EACH NOSTRIL EVERY DAY 09/04/23  Yes Shade Flood, MD  Fluticasone Furoate 50 MCG/ACT AEPB    Yes [provider]  gabapentin (NEURONTIN) 600 MG tablet TAKE 1 TABLET (600 MG TOTAL) BY MOUTH 2 (TWO) TIMES DAILY AS NEEDED. 12/18/23  Yes Shade Flood, MD  ibuprofen (ADVIL,MOTRIN) 600 MG tablet Take 1 tablet (600 mg total) by mouth every 8 (eight) hours as needed (with food). 10/21/17  Yes Shade Flood, MD  Misc Natural Products (CALCIUM PLUS ADVANCED PO) Take 600 mg by mouth 2 (two) times daily.    Yes [provider]  Multiple Vitamins-Minerals (CENTRUM SILVER PO) Take by mouth.   Yes [provider]  Omega-3 Fatty Acids (FISH OIL) 1200 MG CAPS Take 1,200 mg by mouth 3 (three) times daily.    Yes [provider]  pantoprazole (PROTONIX) 40 MG tablet TAKE 1 TABLET BY MOUTH EVERY DAY IN THE MORNING 09/22/23  Yes Shade Flood, MD  RA Vitamin E-Vit A & D 20000 units CREA 1 tablet with a meal   Yes [provider]   rosuvastatin (CRESTOR) 5 MG tablet 1 TAB EVERY NIGHT AT [facility] 09/22/23  Yes Shade Flood, MD  SYNTHROID 100 MCG tablet TAKE 1 TABLET BY MOUTH DAILY BEFORE BREAKFAST. 12/18/23  Yes Shade Flood, MD  vitamin E 400 UNIT capsule Take 400 Units by mouth daily.   Yes [provider]   Social History   Socioeconomic History   Marital status: Married    Spouse name: Not on file   Number of children: Not on file   Years of education: Not on file   Highest education level: 8th grade  Occupational History   Not on file  Tobacco Use   Smoking status: Never   Smokeless tobacco: Never  Substance and Sexual Activity  Alcohol use: No   Drug use: No   Sexual activity: Yes    Partners: Male  Other Topics Concern   Not on file  Social History Narrative   Married. Education: [employer].    Social Drivers of Corporate investment banker Strain: Low Risk  (03/28/2024)   Overall Financial Resource Strain (CARDIA)    Difficulty of Paying Living Expenses: Not hard at all  Food Insecurity: No Food Insecurity (03/28/2024)   Hunger Vital Sign    Worried About Running Out of Food in the Last Year: Never true    Ran Out of Food in the Last Year: Never true  Transportation Needs: No Transportation Needs (03/28/2024)   PRAPARE - Administrator, Civil Service (Medical): No    Lack of Transportation (Non-Medical): No  Physical Activity: Sufficiently Active (03/28/2024)   Exercise Vital Sign    Days of Exercise per Week: 7 days    Minutes of Exercise per Session: 120 min  Recent Concern: Physical Activity - Insufficiently Active (03/09/2024)   Exercise Vital Sign    Days of Exercise per Week: 4 days    Minutes of Exercise per Session: 30 min  Stress: No Stress Concern Present (03/28/2024)   [employer] of Occupational Health - Occupational Stress Questionnaire    Feeling of Stress : Not at all  Social Connections: Socially Integrated (03/28/2024)   Social Connection and  Isolation Panel [NHANES]    Frequency of Communication with Friends and Family: More than three times a week    Frequency of Social Gatherings with Friends and Family: More than three times a week    Attends Religious Services: More than 4 times per year    Active Member of [employer] or Organizations: Yes    Attends Banker Meetings: More than 4 times per year    Marital Status: Married  Catering manager Violence: Not At Risk (03/09/2024)   Humiliation, Afraid, Rape, and Kick questionnaire    Fear of Current or Ex-Partner: No    Emotionally Abused: No    Physically Abused: No    Sexually Abused: No    Review of Systems Per HPI.   Objective:   Vitals:   03/29/24 1310  BP: 126/78  Pulse: 71  Temp: 98.3 F (36.8 C)  TempSrc: Temporal  SpO2: 97%  Weight: 216 lb 3.2 oz (98.1 kg)  Height: 5' 3.25" (1.607 m)     Physical Exam Vitals reviewed.  Constitutional:      Appearance: Normal appearance. She is well-developed.  HENT:     Head: Normocephalic and atraumatic.  Eyes:     Conjunctiva/sclera: Conjunctivae normal.     Pupils: Pupils are equal, round, and reactive to light.  Neck:     Vascular: No carotid bruit.  Cardiovascular:     Rate and Rhythm: Normal rate and regular rhythm.     Heart sounds: Normal heart sounds.  Pulmonary:     Effort: Pulmonary effort is normal.     Breath sounds: Normal breath sounds.  Abdominal:     Palpations: Abdomen is soft. There is no pulsatile mass.     Tenderness: There is no abdominal tenderness.  Musculoskeletal:     Cervical back: No rigidity or tenderness.     Right lower leg: No edema.     Left lower leg: No edema.  Skin:    General: Skin is warm and dry.  Neurological:     Mental Status: She is alert and  oriented to person, place, and time.  Psychiatric:        Mood and Affect: Mood normal.        Behavior: Behavior normal.        Assessment & Plan:  Veronica Martinez is a 68 y.o. female . Prediabetes  - Plan: Hemoglobin A1c  - Improved control previously, and with weight loss I expect numbers to continue to improve.  Check A1c again and then if persistent normal readings can defer to once yearly.  Hypothyroidism, unspecified type - Plan: TSH, SYNTHROID 100 MCG tablet  - Tolerating current dosage, check TSH and adjust plan accordingly  Gastroesophageal reflux disease, unspecified whether esophagitis present - Plan: pantoprazole (PROTONIX) 40 MG tablet  - Continue Protonix, trigger avoidance.  RTC precautions if more symptomatic.  Hyperlipidemia, unspecified hyperlipidemia type - Plan: Comprehensive metabolic panel with GFR, Lipid panel, rosuvastatin (CRESTOR) 5 MG tablet  -Tolerating current dose of statin, check labs and adjust plan accordingly  Nerve pain - Plan: gabapentin (NEURONTIN) 600 MG tablet  - Meralgia paresthetica stable, continue same dose gabapentin, denies new side effects, those have been discussed.  Meds ordered this encounter  Medications   gabapentin (NEURONTIN) 600 MG tablet    Sig: Take 1 tablet (600 mg total) by mouth 2 (two) times daily as needed.    Dispense:  180 tablet    Refill:  1   pantoprazole (PROTONIX) 40 MG tablet    Sig: Take 1 tablet (40 mg total) by mouth daily as needed.    Dispense:  90 tablet    Refill:  1   rosuvastatin (CRESTOR) 5 MG tablet    Sig: 1 tab every night at 6pm    Dispense:  90 tablet    Refill:  1   SYNTHROID 100 MCG tablet    Sig: Take 1 tablet (100 mcg total) by mouth daily before breakfast.    Dispense:  90 tablet    Refill:  1   Patient Instructions  Thanks for coming in today.  No med changes at this time.  If any concerns on labs I will let you know.  Great work on the weight loss, I suspect your numbers will look much better!  Take care!    Signed,   Meredith Staggers, MD [facility], [facility] 03/29/24 1:30 PM

## 2024-03-29 NOTE — Patient Instructions (Signed)
 Thanks for coming in today.  No med changes at this time.  If any concerns on labs I will let you know.  Great work on the weight loss, I suspect your numbers will look much better!  Take care!

## 2024-04-02 ENCOUNTER — Encounter: Payer: Self-pay | Admitting: Family Medicine

## 2024-05-13 DIAGNOSIS — M79671 Pain in right foot: Secondary | ICD-10-CM | POA: Diagnosis not present

## 2024-05-13 DIAGNOSIS — M79672 Pain in left foot: Secondary | ICD-10-CM | POA: Diagnosis not present

## 2024-05-14 DIAGNOSIS — M25551 Pain in right hip: Secondary | ICD-10-CM | POA: Diagnosis not present

## 2024-05-14 DIAGNOSIS — M160 Bilateral primary osteoarthritis of hip: Secondary | ICD-10-CM | POA: Diagnosis not present

## 2024-05-14 DIAGNOSIS — M461 Sacroiliitis, not elsewhere classified: Secondary | ICD-10-CM | POA: Diagnosis not present

## 2024-05-14 DIAGNOSIS — Z96643 Presence of artificial hip joint, bilateral: Secondary | ICD-10-CM | POA: Diagnosis not present

## 2024-05-14 DIAGNOSIS — M62838 Other muscle spasm: Secondary | ICD-10-CM | POA: Diagnosis not present

## 2024-05-14 DIAGNOSIS — M545 Low back pain, unspecified: Secondary | ICD-10-CM | POA: Diagnosis not present

## 2024-10-04 ENCOUNTER — Encounter: Payer: Self-pay | Admitting: Family Medicine

## 2024-10-04 ENCOUNTER — Ambulatory Visit (INDEPENDENT_AMBULATORY_CARE_PROVIDER_SITE_OTHER): Admitting: Family Medicine

## 2024-10-04 VITALS — BP 124/68 | HR 68 | Temp 97.7°F | Resp 18 | Ht 63.25 in | Wt 226.4 lb

## 2024-10-04 DIAGNOSIS — R5383 Other fatigue: Secondary | ICD-10-CM

## 2024-10-04 DIAGNOSIS — Z0000 Encounter for general adult medical examination without abnormal findings: Secondary | ICD-10-CM | POA: Diagnosis not present

## 2024-10-04 DIAGNOSIS — K219 Gastro-esophageal reflux disease without esophagitis: Secondary | ICD-10-CM | POA: Insufficient documentation

## 2024-10-04 DIAGNOSIS — M792 Neuralgia and neuritis, unspecified: Secondary | ICD-10-CM

## 2024-10-04 DIAGNOSIS — E785 Hyperlipidemia, unspecified: Secondary | ICD-10-CM | POA: Diagnosis not present

## 2024-10-04 DIAGNOSIS — E039 Hypothyroidism, unspecified: Secondary | ICD-10-CM | POA: Diagnosis not present

## 2024-10-04 DIAGNOSIS — R7303 Prediabetes: Secondary | ICD-10-CM | POA: Diagnosis not present

## 2024-10-04 DIAGNOSIS — Z860100 Personal history of colon polyps, unspecified: Secondary | ICD-10-CM | POA: Insufficient documentation

## 2024-10-04 LAB — COMPREHENSIVE METABOLIC PANEL WITH GFR
ALT: 18 U/L (ref 0–35)
AST: 20 U/L (ref 0–37)
Albumin: 4.6 g/dL (ref 3.5–5.2)
Alkaline Phosphatase: 77 U/L (ref 39–117)
BUN: 11 mg/dL (ref 6–23)
CO2: 31 meq/L (ref 19–32)
Calcium: 10.3 mg/dL (ref 8.4–10.5)
Chloride: 104 meq/L (ref 96–112)
Creatinine, Ser: 0.81 mg/dL (ref 0.40–1.20)
GFR: 73.53 mL/min (ref >60.00)
Glucose, Bld: 92 mg/dL (ref 70–99)
Potassium: 4.5 meq/L (ref 3.5–5.1)
Sodium: 142 meq/L (ref 135–145)
Total Bilirubin: 0.4 mg/dL (ref 0.2–1.2)
Total Protein: 7.7 g/dL (ref 6.0–8.3)

## 2024-10-04 LAB — CBC
HCT: 42.5 % (ref 36.0–46.0)
Hemoglobin: 13.9 g/dL (ref 12.0–15.0)
MCHC: 32.7 g/dL (ref 30.0–36.0)
MCV: 95.8 fl (ref 78.0–100.0)
Platelets: 181 K/uL (ref 150.0–400.0)
RBC: 4.43 Mil/uL (ref 3.87–5.11)
RDW: 14.1 % (ref 11.5–15.5)
WBC: 3.9 K/uL — ABNORMAL LOW (ref 4.0–10.5)

## 2024-10-04 LAB — HEMOGLOBIN A1C: Hgb A1c MFr Bld: 6 % (ref 4.6–6.5)

## 2024-10-04 LAB — LIPID PANEL
Cholesterol: 157 mg/dL (ref 0–200)
HDL: 87.3 mg/dL (ref >39.00)
LDL Cholesterol: 59 mg/dL (ref 0–99)
NonHDL: 69.53
Total CHOL/HDL Ratio: 2
Triglycerides: 53 mg/dL (ref 0.0–149.0)
VLDL: 10.6 mg/dL (ref 0.0–40.0)

## 2024-10-04 LAB — TSH: TSH: 3.12 u[IU]/mL (ref 0.35–5.50)

## 2024-10-04 MED ORDER — SYNTHROID 100 MCG PO TABS: 100.0000 ug | ORAL_TABLET | Freq: Every day | ORAL | 1 refills | Status: AC

## 2024-10-04 MED ORDER — ROSUVASTATIN CALCIUM 5 MG PO TABS: ORAL_TABLET | ORAL | 1 refills | Status: AC

## 2024-10-04 MED ORDER — GABAPENTIN 600 MG PO TABS: 600.0000 mg | ORAL_TABLET | Freq: Two times a day (BID) | ORAL | 1 refills | Status: AC | PRN

## 2024-10-04 NOTE — Patient Instructions (Addendum)
 We recommend that you schedule a mammogram for breast cancer screening. Typically, you do not need a referral to do this. [facility]) - [phone] or [phone]  See info on fatigue. Depending on labs I may want you to meet with cardiology to determine if other testing needed. Return to the clinic or go to the nearest emergency room if any of your symptoms worsen or new symptoms occur.  Fatigue If you have fatigue, you feel tired all the time and have a lack of energy or a lack of motivation. Fatigue may make it difficult to start or complete tasks because of exhaustion. Occasional or mild fatigue is often a normal response to activity or life. However, long-term (chronic) or extreme fatigue may be a symptom of a medical condition such as: Depression. Not having enough red blood cells or hemoglobin in the blood (anemia). A problem with a small gland located in the lower front part of the neck (thyroid  disorder). Rheumatologic conditions. These are problems related to the body's defense system (immune system). Infections, especially certain viral infections. Fatigue can also lead to negative health outcomes over time. Follow these instructions at home: Medicines Take over-the-counter and prescription medicines only as told by your health care provider. Take a multivitamin if told by your health care provider. Do not use herbal or dietary supplements unless they are approved by your health care provider. Eating and drinking  Avoid heavy meals in the evening. Eat a well-balanced diet, which includes lean proteins, whole grains, plenty of fruits and vegetables, and low-fat dairy products. Avoid eating or drinking too many products with caffeine in them. Avoid alcohol. Drink enough fluid to keep your urine pale yellow. Activity  Exercise regularly, as told by your health care provider. Use or practice techniques to help you relax, such as yoga, tai chi,  meditation, or massage therapy. Lifestyle Change situations that cause you stress. Try to keep your work and personal schedules in balance. Do not use recreational or illegal drugs. General instructions Monitor your fatigue for any changes. Go to bed and get up at the same time every day. Avoid fatigue by pacing yourself during the day and getting enough sleep at night. Maintain a healthy weight. Contact a health care provider if: Your fatigue does not get better. You have a fever. You suddenly lose or gain weight. You have headaches. You have trouble falling asleep or sleeping through the night. You feel angry, guilty, anxious, or sad. You have swelling in your legs or another part of your body. Get help right away if: You feel confused, feel like you might faint, or faint. Your vision is blurry or you have a severe headache. You have severe pain in your abdomen, your back, or the area between your waist and hips (pelvis). You have chest pain, shortness of breath, or an irregular or fast heartbeat. You are unable to urinate, or you urinate less than normal. You have abnormal bleeding from the rectum, nose, lungs, nipples, or, if you are female, the vagina. You vomit blood. You have thoughts about hurting yourself or others. These symptoms may be an emergency. Get help right away. Call 911. Do not wait to see if the symptoms will go away. Do not drive yourself to the hospital. Get help right away if you feel like you may hurt yourself or others, or have thoughts about taking your own life. Go to your nearest emergency room or: Call 911. Call the [facility] Suicide Prevention  Lifeline at (858) 041-7778 or 988. This is open 24 hours a day. Text the Crisis Text Line at 318-278-5364. Summary If you have fatigue, you feel tired all the time and have a lack of energy or a lack of motivation. Fatigue may make it difficult to start or complete tasks because of exhaustion. Long-term (chronic) or  extreme fatigue may be a symptom of a medical condition. Exercise regularly, as told by your health care provider. Change situations that cause you stress. Try to keep your work and personal schedules in balance. This information is not intended to replace advice given to you by your health care provider. Make sure you discuss any questions you have with your health care provider. Document Revised: 10/01/2021 Document Reviewed: 10/01/2021 Elsevier Patient Education  2024 [employer].   Preventive Care 65 Years and Older, Female Preventive care refers to lifestyle choices and visits with your health care provider that can promote health and wellness. Preventive care visits are also called wellness exams. What can I expect for my preventive care visit? Counseling Your health care provider may ask you questions about your: Medical history, including: Past medical problems. Family medical history. Pregnancy and menstrual history. History of falls. Current health, including: Memory and ability to understand (cognition). Emotional well-being. Home life and relationship well-being. Sexual activity and sexual health. Lifestyle, including: Alcohol, nicotine or tobacco, and drug use. Access to firearms. Diet, exercise, and sleep habits. Work and work Astronomer. Sunscreen use. Safety issues such as seatbelt and bike helmet use. Physical exam Your health care provider will check your: Height and weight. These may be used to calculate your BMI (body mass index). BMI is a measurement that tells if you are at a healthy weight. Waist circumference. This measures the distance around your waistline. This measurement also tells if you are at a healthy weight and may help predict your risk of certain diseases, such as type 2 diabetes and high blood pressure. Heart rate and blood pressure. Body temperature. Skin for abnormal spots. What immunizations do I need?  Vaccines are usually given at  various ages, according to a schedule. Your health care provider will recommend vaccines for you based on your age, medical history, and lifestyle or other factors, such as travel or where you work. What tests do I need? Screening Your health care provider may recommend screening tests for certain conditions. This may include: Lipid and cholesterol levels. Hepatitis C test. Hepatitis B test. HIV (human immunodeficiency virus) test. STI (sexually transmitted infection) testing, if you are at risk. Lung cancer screening. Colorectal cancer screening. Diabetes screening. This is done by checking your blood sugar (glucose) after you have not eaten for a while (fasting). Mammogram. Talk with your health care provider about how often you should have regular mammograms. BRCA-related cancer screening. This may be done if you have a family history of breast, ovarian, tubal, or peritoneal cancers. Bone density scan. This is done to screen for osteoporosis. Talk with your health care provider about your test results, treatment options, and if necessary, the need for more tests. Follow these instructions at home: Eating and drinking  Eat a diet that includes fresh fruits and vegetables, whole grains, lean protein, and low-fat dairy products. Limit your intake of foods with high amounts of sugar, saturated fats, and salt. Take vitamin and mineral supplements as recommended by your health care provider. Do not drink alcohol if your health care provider tells you not to drink. If you drink alcohol: Limit how much you  have to 0-1 drink a day. Know how much alcohol is in your drink. In the [place] one drink equals one 12 oz bottle of beer (355 mL), one 5 oz glass of wine (148 mL), or one 1 oz glass of hard liquor (44 mL). Lifestyle Brush your teeth every morning and night with fluoride toothpaste. Floss one time each day. Exercise for at least 30 minutes 5 or more days each week. Do not use any products  that contain nicotine or tobacco. These products include cigarettes, chewing tobacco, and vaping devices, such as e-cigarettes. If you need help quitting, ask your health care provider. Do not use drugs. If you are sexually active, practice safe sex. Use a condom or other form of protection in order to prevent STIs. Take aspirin only as told by your health care provider. Make sure that you understand how much to take and what form to take. Work with your health care provider to find out whether it is safe and beneficial for you to take aspirin daily. Ask your health care provider if you need to take a cholesterol-lowering medicine (statin). Find healthy ways to manage stress, such as: Meditation, yoga, or listening to music. Journaling. Talking to a trusted person. Spending time with friends and family. Minimize exposure to UV radiation to reduce your risk of skin cancer. Safety Always wear your seat belt while driving or riding in a vehicle. Do not drive: If you have been drinking alcohol. Do not ride with someone who has been drinking. When you are tired or distracted. While texting. If you have been using any mind-altering substances or drugs. Wear a helmet and other protective equipment during sports activities. If you have firearms in your house, make sure you follow all gun safety procedures. What's next? Visit your health care provider once a year for an annual wellness visit. Ask your health care provider how often you should have your eyes and teeth checked. Stay up to date on all vaccines. This information is not intended to replace advice given to you by your health care provider. Make sure you discuss any questions you have with your health care provider. Document Revised: 06/06/2021 Document Reviewed: 06/06/2021 Elsevier Patient Education  2024 [employer].

## 2024-10-04 NOTE — Progress Notes (Signed)
 Subjective:  Patient ID: Veronica Martinez, female    DOB: January 18, 1956  Age: 68 y.o. MRN: [id number]  CC:  Chief Complaint  Patient presents with   Annual Exam    No questions or concerns.     HPI Veronica Martinez presents for Annual Exam, here with dtr.   New concern: Fatigue with exertion. See below.   PCP, me Gynecology, Dr. Perri, Dr. Jolie at [facility] - hip pain as below.  Ortho, Dr. Yvone  - R foot pain - improved Ortho Dr. Nancyann - R hip pain. Improved.  Optho - Dr.  Medford Gaudy.   Hypothyroidism: Lab Results  Component Value Date   TSH 2.12 03/29/2024  Treated with Synthroid  100 mcg daily Taking medication daily.  Feeling warm at night. No new hair or skin changes, no heart palpitations. Fatigue with exertion. Weight up slightly.   Meralgia paresthetica Treated with gabapentin  1/day. 600mg . Working ok at this dose.   Hyperlipidemia: Crestor  5 mg daily. No new myalgias/side effects.  Lab Results  Component Value Date   CHOL 155 03/29/2024   HDL 89.10 03/29/2024   LDLCALC 54 03/29/2024   TRIG 63.0 03/29/2024   CHOLHDL 2 03/29/2024   Lab Results  Component Value Date   ALT 19 03/29/2024   AST 20 03/29/2024   ALKPHOS 78 03/29/2024   BILITOT 0.4 03/29/2024   Prediabetes: Barely at prediabetic level in April.  weight had improved at that time, has increased by 10 pounds since April.  Avoiding soda/sweet tea.  Rare fast food, only occasional takeout/restaurant.  Lab Results  Component Value Date   HGBA1C 5.7 03/29/2024   Wt Readings from Last 3 Encounters:  10/04/24 226 lb 6.4 oz (102.7 kg)  03/29/24 216 lb 3.2 oz (98.1 kg)  09/29/23 228 lb 3.2 oz (103.5 kg)   GERD Avoidance of trigger foods, Protonix  40 mg daily.  Intermittent dosing of Protonix  previously, still stable with infrequent use - once per month.    Fatigue: Notices with walking at times - becomes very tired. No chest pain/palptitations.no dyspnea. Sleeping ok, no daytime  somnolence. Fatigue improves with resting from walk. Notices past 2-3 months. Comes and goes.       10/04/2024    8:07 AM 03/09/2024    2:26 PM 03/27/2023   12:56 PM 03/13/2023    8:40 AM 09/25/2022    2:05 PM  Depression screen PHQ 2/9  Decreased Interest 0 0 0 0 0  Down, Depressed, Hopeless 0 0 0 0 0  PHQ - 2 Score 0 0 0 0 0  Altered sleeping 0 0 0 0 0  Tired, decreased energy 0 0 0 0 0  Change in appetite 0 0 0 0 0  Feeling bad or failure about yourself  0 0 0 0 0  Trouble concentrating 0 0 0 0 0  Moving slowly or fidgety/restless 0 0 0 0 0  Suicidal thoughts 0 0 0 0 0  PHQ-9 Score 0 0 0 0 0  Difficult doing work/chores Not difficult at all Not difficult at all  Not difficult at all     Health Maintenance  Topic Date Due   Mammogram  10/25/2023   DTaP/Tdap/Td (2 - Td or Tdap) 01/10/2025   Medicare Annual Wellness (AWV)  03/09/2025   Colonoscopy  10/25/2026   Pneumococcal Vaccine: 50+ Years  Completed   Influenza Vaccine  Completed   DEXA SCAN  Completed   Hepatitis C Screening  Completed   Zoster Vaccines-  Shingrix   Completed   Meningococcal B Vaccine  Aged Out   COVID-19 Vaccine  Discontinued  Colonoscopy July 2023 at [facility] with repeat planned at 5 years. Mammogram - due.  Bone density 03/31/2023, osteopenia without osteoporosis.  Calcium  and vitamin D  supplementation.   Immunization History  Administered Date(s) Administered    sv, Bivalent, Protein Subunit Rsvpref,pf (Abrysvo) 10/17/2023   Fluad Quad(high Dose 65+) 08/18/2019, 09/21/2020   Fluad Trivalent(High Dose 65+) 09/29/2023   INFLUENZA, HIGH DOSE SEASONAL PF 09/28/2024   Influenza Split 09/01/2012   Influenza,inj,Quad PF,6+ Mos 09/07/2013, 01/10/2015, 12/26/2015, 08/21/2017, 02/15/2019   Influenza-Unspecified 10/24/2022   PFIZER(Purple Top)SARS-COV-2 Vaccination 02/05/2020, 03/01/2020, 09/25/2020   Pfizer Covid-19 Vaccine Bivalent Booster 5y-11y 10/24/2022   Pfizer(Comirnaty)Fall Seasonal Vaccine 12 years  and older 09/28/2024   Pneumococcal Conjugate-13 08/18/2019   Pneumococcal Polysaccharide-23 09/25/2022   Tdap 01/10/2015   Zoster Recombinant(Shingrix ) 08/18/2019, 06/22/2020  Had covid and flu vaccine - 10/7.  Option of RSV vaccine discussed.   No results found. Dr. Octavia - at least yearly.   Dental: every 6 months.   Alcohol:none  Tobacco: none  Exercise: walking, lifting weights. Daily.    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   Pain of right heel 12/08/2012   Myalgia 11/10/2012   OBESITY, UNSPECIFIED 02/20/2011   BACK PAIN, LUMBAR 07/05/2010   ABNORMALITY OF GAIT 05/02/2010   MERALGIA PARESTHETICA 03/02/2010   HIP PAIN, BILATERAL 03/02/2010   Past Medical History:  Diagnosis Date   Allergy    Arthritis    GERD (gastroesophageal reflux disease)    Glaucoma    Headache(784.0)    sinus   High cholesterol    History of Graves' disease    Hx of radioactive iodine thyroid  ablation    Hyperlipidemia    Phreesia 11/08/2020   Hypothyroidism    Sickle cell anemia (HCC)    Phreesia 11/08/2020   Past Surgical History:  Procedure Laterality Date   ABDOMINAL HYSTERECTOMY     APPENDECTOMY     BREAST BIOPSY Right 11/04/2022   MM RT BREAST BX W LOC DEV 1ST LESION IMAGE BX SPEC STEREO GUIDE 11/04/2022 [facility]   CATARACT EXTRACTION W/PHACO Right 09/22/2013   Procedure: CATARACT EXTRACTION PHACO AND  INTRAOCULAR LENS PLACEMENT (IOC);  Surgeon: Jestine Bunnell, MD;  Location: [facility] OR;  Service: Ophthalmology;  Laterality: Right;   CATARACT EXTRACTION W/PHACO Left 12/29/2013   Procedure: CATARACT EXTRACTION PHACO AND INTRAOCULAR LENS PLACEMENT (IOC);  Surgeon: Jestine Bunnell, MD;  Location: [facility] OR;  Service: Ophthalmology;  Laterality: Left;   COLONOSCOPY     EYE SURGERY     HIP ARTHROPLASTY Bilateral    JOINT REPLACEMENT     OTHER SURGICAL HISTORY Right    carpal tunel injection   SHOULDER SURGERY     TONSILLECTOMY     Allergies  Allergen Reactions   Other    Meloxicam Other (See Comments)    Causes Bruising    Pravastatin Sodium Other (See Comments)  Bruises   Simvastatin Other (See Comments)    Bruises    Tramadol Other (See Comments)   Tramadol Hcl Other (See Comments)    Bruising    Tramadol Hcl Other (See Comments)   Prior to Admission medications   Medication Sig Start Date End Date Taking? Authorizing Provider  acetaminophen  (TYLENOL ) 650 MG CR tablet Take 650 mg by mouth every 8 (eight) hours as needed for pain.   Yes [provider]  Calcium  Carbonate (CALCIUM  600 PO) Take by mouth.   Yes [provider]  cetirizine (ZYRTEC) 10 MG tablet 1 tablet   Yes [provider]  Cholecalciferol (VITAMIN D ) 50 MCG (2000 UT) tablet Take 2,000 Units by mouth daily.   Yes [provider]  fluticasone  (FLONASE ) 50 MCG/ACT nasal spray SPRAY 2 SPRAYS INTO EACH NOSTRIL EVERY DAY 09/04/23  Yes Levora Reyes SAUNDERS, MD  gabapentin  (NEURONTIN ) 600 MG tablet Take 1 tablet (600 mg total) by mouth 2 (two) times daily as needed. 03/29/24  Yes Levora Reyes SAUNDERS, MD  ibuprofen  (ADVIL ,MOTRIN ) 600 MG tablet Take 1 tablet (600 mg total) by mouth every 8 (eight) hours as needed (with food). 10/21/17  Yes Levora Reyes SAUNDERS, MD  Misc Natural Products (CALCIUM  PLUS ADVANCED PO) Take 600 mg by mouth 2 (two) times daily.    Yes [provider]  Multiple Vitamins-Minerals  (CENTRUM SILVER PO) Take by mouth.   Yes [provider]  Omega-3 Fatty Acids (FISH OIL) 1200 MG CAPS Take 1,200 mg by mouth 3 (three) times daily.    Yes [provider]  pantoprazole  (PROTONIX ) 40 MG tablet Take 1 tablet (40 mg total) by mouth daily as needed. 03/29/24  Yes Levora Reyes SAUNDERS, MD  RA Vitamin E-Vit A & D 20000 units CREA 1 tablet with a meal   Yes [provider]  rosuvastatin  (CRESTOR ) 5 MG tablet 1 tab every night at 6pm 03/29/24  Yes Levora Reyes SAUNDERS, MD  SYNTHROID  100 MCG tablet Take 1 tablet (100 mcg total) by mouth daily before breakfast. 03/29/24  Yes Levora Reyes SAUNDERS, MD  vitamin E 400 UNIT capsule Take 400 Units by mouth daily.   Yes [provider]  Azelastine  HCl 137 MCG/SPRAY SOLN PLACE 2 SPRAYS INTO BOTH NOSTRILS 2 (TWO) TIMES DAILY. USE IN EACH NOSTRIL AS DIRECTED 08/09/22   Tabori, Katherine E, MD  Coenzyme Q10 (CO Q 10 PO) Take 75 mg by mouth 3 (three) times daily.    [provider]  Fluticasone  Furoate 50 MCG/ACT AEPB     [provider]   Social History   Socioeconomic History   Marital status: Married    Spouse name: Not on file   Number of children: Not on file   Years of education: Not on file   Highest education level: 7th grade  Occupational History   Not on file  Tobacco Use   Smoking status: Never   Smokeless tobacco: Never  Vaping Use   Vaping status: Never Used  Substance and Sexual Activity   Alcohol use: No   Drug use: No   Sexual activity: Yes    Partners: Male    Birth control/protection: None  Other Topics Concern   Not on file  Social History Narrative   Married. Education: [employer].    Social Drivers of Corporate investment banker Strain: Low Risk  (10/03/2024)   Overall Financial Resource Strain (CARDIA)    Difficulty of Paying Living Expenses: Not hard at  all  Food Insecurity: No Food Insecurity (10/03/2024)   Hunger Vital Sign    Worried About Running Out of Food in  the Last Year: Never true    Ran Out of Food in the Last Year: Never true  Transportation Needs: No Transportation Needs (10/03/2024)   PRAPARE - Administrator, Civil Service (Medical): No    Lack of Transportation (Non-Medical): No  Physical Activity: Sufficiently Active (10/03/2024)   Exercise Vital Sign    Days of Exercise per Week: 5 days    Minutes of Exercise per Session: 150+ min  Stress: No Stress Concern Present (10/03/2024)   [employer] of Occupational Health - Occupational Stress Questionnaire    Feeling of Stress: Not at all  Social Connections: Socially Integrated (10/03/2024)   Social Connection and Isolation Panel    Frequency of Communication with Friends and Family: More than three times a week    Frequency of Social Gatherings with Friends and Family: More than three times a week    Attends Religious Services: More than 4 times per year    Active Member of [employer] or Organizations: Yes    Attends Banker Meetings: 1 to 4 times per year    Marital Status: Married  Catering manager Violence: Not At Risk (03/09/2024)   Humiliation, Afraid, Rape, and Kick questionnaire    Fear of Current or Ex-Partner: No    Emotionally Abused: No    Physically Abused: No    Sexually Abused: No    Review of Systems  13 point review of systems per patient health survey noted.  Negative other than as indicated above or in HPI.   Objective:   Vitals:   10/04/24 0803  BP: 124/68  Pulse: 68  Resp: 18  Temp: 97.7 F (36.5 C)  TempSrc: Temporal  SpO2: 96%  Weight: 226 lb 6.4 oz (102.7 kg)  Height: 5' 3.25 (1.607 m)     Physical Exam Constitutional:      Appearance: She is well-developed.  HENT:     Head: Normocephalic and atraumatic.     Right Ear: External ear normal.     Left Ear: External ear normal.  Eyes:     Conjunctiva/sclera: Conjunctivae normal.     Pupils: Pupils are equal, round, and reactive to light.  Neck:     Thyroid : No  thyromegaly.  Cardiovascular:     Rate and Rhythm: Normal rate and regular rhythm.     Heart sounds: Normal heart sounds. No murmur heard. Pulmonary:     Effort: Pulmonary effort is normal. No respiratory distress.     Breath sounds: Normal breath sounds. No wheezing.  Abdominal:     General: Bowel sounds are normal.     Palpations: Abdomen is soft.     Tenderness: There is no abdominal tenderness.  Musculoskeletal:        General: No tenderness. Normal range of motion.     Cervical back: Normal range of motion and neck supple.  Lymphadenopathy:     Cervical: No cervical adenopathy.  Skin:    General: Skin is warm and dry.     Findings: No rash.  Neurological:     Mental Status: She is alert and oriented to person, place, and time.  Psychiatric:        Behavior: Behavior normal.        Thought Content: Thought content normal.     EKG sinus bradycardia, rate 57.  No acute ST or T  wave changes.Compared to EKG on 06/04/2016, rate at that time 81.  No significant changes.   Assessment & Plan:  Veronica Martinez is a 68 y.o. female . Annual physical exam  - -anticipatory guidance as below in AVS, screening labs above. Health maintenance items as above in HPI discussed/recommended as applicable.   Hypothyroidism, unspecified type - Plan: TSH  - Fatigue with activity as below, less likely thyroid  cause but is bradycardic on EKG.  Check TSH and adjust regimen accordingly, refilled Synthroid  100 mcg daily for now.  Gastroesophageal reflux disease, unspecified whether esophagitis present  - Stable with infrequent dosing of PPI.  Continue same.  Prediabetes - Plan: Hemoglobin A1c  - Commended on exercise and diet adherence.  Check A1c and adjust plan accordingly.  Nerve pain  - Meralgia paresthetica stable with once daily gabapentin  at this time.  She is weaned back to 1 pill/day stable on that dosing.  Refills ordered, can increase up to twice per day if  needed.  Hyperlipidemia, unspecified hyperlipidemia type - Plan: Lipid panel  - Tolerating current dose of Crestor , continue same, check lipid panel and adjust plan accordingly  Fatigue, unspecified type - Plan: EKG 12-Lead, CBC, Comprehensive metabolic panel with GFR, Hemoglobin A1c, TSH, Lipid panel  - New acute concern.  Fatigue with walking that improves with rest.  Denies any cardiac symptoms specifically no palpitations chest pain or tightness.  Denies fatigue at other times during the day or at rest, unlikely sleep or sleep apnea related.  Less likely thyroid  related or anemia but will check testing as above.  If no obvious source on labs, would consider cardiology eval to decide if further cardiac testing indicated, or Zio patch given bradycardia on EKG.  RTC precautions, 1 month follow-up.  Meds ordered this encounter  Medications   gabapentin  (NEURONTIN ) 600 MG tablet    Sig: Take 1 tablet (600 mg total) by mouth 2 (two) times daily as needed.    Dispense:  180 tablet    Refill:  1   rosuvastatin  (CRESTOR ) 5 MG tablet    Sig: 1 tab every night at 6pm    Dispense:  90 tablet    Refill:  1   SYNTHROID  100 MCG tablet    Sig: Take 1 tablet (100 mcg total) by mouth daily before breakfast.    Dispense:  90 tablet    Refill:  1   Patient Instructions  We recommend that you schedule a mammogram for breast cancer screening. Typically, you do not need a referral to do this. [facility]) - [phone] or [phone]  See info on fatigue. Depending on labs I may want you to meet with cardiology to determine if other testing needed. Return to the clinic or go to the nearest emergency room if any of your symptoms worsen or new symptoms occur.  Fatigue If you have fatigue, you feel tired all the time and have a lack of energy or a lack of motivation. Fatigue may make it difficult to start or complete tasks because of exhaustion. Occasional or mild fatigue is  often a normal response to activity or life. However, long-term (chronic) or extreme fatigue may be a symptom of a medical condition such as: Depression. Not having enough red blood cells or hemoglobin in the blood (anemia). A problem with a small gland located in the lower front part of the neck (thyroid  disorder). Rheumatologic conditions. These are problems related to the body's defense system (immune  system). Infections, especially certain viral infections. Fatigue can also lead to negative health outcomes over time. Follow these instructions at home: Medicines Take over-the-counter and prescription medicines only as told by your health care provider. Take a multivitamin if told by your health care provider. Do not use herbal or dietary supplements unless they are approved by your health care provider. Eating and drinking  Avoid heavy meals in the evening. Eat a well-balanced diet, which includes lean proteins, whole grains, plenty of fruits and vegetables, and low-fat dairy products. Avoid eating or drinking too many products with caffeine in them. Avoid alcohol. Drink enough fluid to keep your urine pale yellow. Activity  Exercise regularly, as told by your health care provider. Use or practice techniques to help you relax, such as yoga, tai chi, meditation, or massage therapy. Lifestyle Change situations that cause you stress. Try to keep your work and personal schedules in balance. Do not use recreational or illegal drugs. General instructions Monitor your fatigue for any changes. Go to bed and get up at the same time every day. Avoid fatigue by pacing yourself during the day and getting enough sleep at night. Maintain a healthy weight. Contact a health care provider if: Your fatigue does not get better. You have a fever. You suddenly lose or gain weight. You have headaches. You have trouble falling asleep or sleeping through the night. You feel angry, guilty, anxious, or  sad. You have swelling in your legs or another part of your body. Get help right away if: You feel confused, feel like you might faint, or faint. Your vision is blurry or you have a severe headache. You have severe pain in your abdomen, your back, or the area between your waist and hips (pelvis). You have chest pain, shortness of breath, or an irregular or fast heartbeat. You are unable to urinate, or you urinate less than normal. You have abnormal bleeding from the rectum, nose, lungs, nipples, or, if you are female, the vagina. You vomit blood. You have thoughts about hurting yourself or others. These symptoms may be an emergency. Get help right away. Call 911. Do not wait to see if the symptoms will go away. Do not drive yourself to the hospital. Get help right away if you feel like you may hurt yourself or others, or have thoughts about taking your own life. Go to your nearest emergency room or: Call 911. Call the National Suicide Prevention Lifeline at 873 320 3873 or 988. This is open 24 hours a day. Text the Crisis Text Line at (774)784-4283. Summary If you have fatigue, you feel tired all the time and have a lack of energy or a lack of motivation. Fatigue may make it difficult to start or complete tasks because of exhaustion. Long-term (chronic) or extreme fatigue may be a symptom of a medical condition. Exercise regularly, as told by your health care provider. Change situations that cause you stress. Try to keep your work and personal schedules in balance. This information is not intended to replace advice given to you by your health care provider. Make sure you discuss any questions you have with your health care provider. Document Revised: 10/01/2021 Document Reviewed: 10/01/2021 Elsevier Patient Education  2024 [employer].   Preventive Care 65 Years and Older, Female Preventive care refers to lifestyle choices and visits with your health care provider that can promote health  and wellness. Preventive care visits are also called wellness exams. What can I expect for my preventive care visit? Counseling Your  health care provider may ask you questions about your: Medical history, including: Past medical problems. Family medical history. Pregnancy and menstrual history. History of falls. Current health, including: Memory and ability to understand (cognition). Emotional well-being. Home life and relationship well-being. Sexual activity and sexual health. Lifestyle, including: Alcohol, nicotine or tobacco, and drug use. Access to firearms. Diet, exercise, and sleep habits. Work and work Astronomer. Sunscreen use. Safety issues such as seatbelt and bike helmet use. Physical exam Your health care provider will check your: Height and weight. These may be used to calculate your BMI (body mass index). BMI is a measurement that tells if you are at a healthy weight. Waist circumference. This measures the distance around your waistline. This measurement also tells if you are at a healthy weight and may help predict your risk of certain diseases, such as type 2 diabetes and high blood pressure. Heart rate and blood pressure. Body temperature. Skin for abnormal spots. What immunizations do I need?  Vaccines are usually given at various ages, according to a schedule. Your health care provider will recommend vaccines for you based on your age, medical history, and lifestyle or other factors, such as travel or where you work. What tests do I need? Screening Your health care provider may recommend screening tests for certain conditions. This may include: Lipid and cholesterol levels. Hepatitis C test. Hepatitis B test. HIV (human immunodeficiency virus) test. STI (sexually transmitted infection) testing, if you are at risk. Lung cancer screening. Colorectal cancer screening. Diabetes screening. This is done by checking your blood sugar (glucose) after you have not  eaten for a while (fasting). Mammogram. Talk with your health care provider about how often you should have regular mammograms. BRCA-related cancer screening. This may be done if you have a family history of breast, ovarian, tubal, or peritoneal cancers. Bone density scan. This is done to screen for osteoporosis. Talk with your health care provider about your test results, treatment options, and if necessary, the need for more tests. Follow these instructions at home: Eating and drinking  Eat a diet that includes fresh fruits and vegetables, whole grains, lean protein, and low-fat dairy products. Limit your intake of foods with high amounts of sugar, saturated fats, and salt. Take vitamin and mineral supplements as recommended by your health care provider. Do not drink alcohol if your health care provider tells you not to drink. If you drink alcohol: Limit how much you have to 0-1 drink a day. Know how much alcohol is in your drink. In the [place] one drink equals one 12 oz bottle of beer (355 mL), one 5 oz glass of wine (148 mL), or one 1 oz glass of hard liquor (44 mL). Lifestyle Brush your teeth every morning and night with fluoride toothpaste. Floss one time each day. Exercise for at least 30 minutes 5 or more days each week. Do not use any products that contain nicotine or tobacco. These products include cigarettes, chewing tobacco, and vaping devices, such as e-cigarettes. If you need help quitting, ask your health care provider. Do not use drugs. If you are sexually active, practice safe sex. Use a condom or other form of protection in order to prevent STIs. Take aspirin only as told by your health care provider. Make sure that you understand how much to take and what form to take. Work with your health care provider to find out whether it is safe and beneficial for you to take aspirin daily. Ask your health care provider if  you need to take a cholesterol-lowering medicine (statin). Find  healthy ways to manage stress, such as: Meditation, yoga, or listening to music. Journaling. Talking to a trusted person. Spending time with friends and family. Minimize exposure to UV radiation to reduce your risk of skin cancer. Safety Always wear your seat belt while driving or riding in a vehicle. Do not drive: If you have been drinking alcohol. Do not ride with someone who has been drinking. When you are tired or distracted. While texting. If you have been using any mind-altering substances or drugs. Wear a helmet and other protective equipment during sports activities. If you have firearms in your house, make sure you follow all gun safety procedures. What's next? Visit your health care provider once a year for an annual wellness visit. Ask your health care provider how often you should have your eyes and teeth checked. Stay up to date on all vaccines. This information is not intended to replace advice given to you by your health care provider. Make sure you discuss any questions you have with your health care provider. Document Revised: 06/06/2021 Document Reviewed: 06/06/2021 Elsevier Patient Education  2024 [employer] Inc.      Signed,   Reyes Pines, MD [facility], [facility] 10/04/24 9:30 AM

## 2024-10-06 ENCOUNTER — Encounter: Payer: Self-pay | Admitting: Family Medicine

## 2024-10-06 NOTE — Telephone Encounter (Signed)
 Patient is inquiring about lab results. Please advise, thank you

## 2024-10-08 ENCOUNTER — Ambulatory Visit: Payer: Self-pay | Admitting: Family Medicine

## 2024-10-08 NOTE — Telephone Encounter (Signed)
 Reply sent through lab result message.

## 2024-10-15 NOTE — Progress Notes (Signed)
 Lab results have been discussed.   Verbalized understanding? Yes  Are there any questions? No

## 2024-10-16 ENCOUNTER — Other Ambulatory Visit: Payer: Self-pay | Admitting: Family Medicine

## 2024-10-16 DIAGNOSIS — K219 Gastro-esophageal reflux disease without esophagitis: Secondary | ICD-10-CM

## 2024-11-11 ENCOUNTER — Ambulatory Visit (INDEPENDENT_AMBULATORY_CARE_PROVIDER_SITE_OTHER): Admitting: Family Medicine

## 2024-11-11 ENCOUNTER — Encounter: Payer: Self-pay | Admitting: Family Medicine

## 2024-11-11 VITALS — BP 124/68 | HR 63 | Temp 98.3°F | Resp 18 | Ht 63.25 in | Wt 224.8 lb

## 2024-11-11 DIAGNOSIS — R5383 Other fatigue: Secondary | ICD-10-CM

## 2024-11-11 MED ORDER — FLUTICASONE PROPIONATE 50 MCG/ACT NA SUSP: 2.0000 | Freq: Every day | NASAL | 1 refills | Status: AC

## 2024-11-11 NOTE — Progress Notes (Signed)
 Subjective:  Patient ID: Veronica Martinez, female    DOB: March 23, 1956  Age: 68 y.o. MRN: [id number]  CC:  Chief Complaint  Patient presents with   Fatigue    1 month follow up. Patient is doing well. No questions or concerns.     HPI Veronica Martinez presents for   Fatigue Follow-up from her October 13 visit, new concern at that time.  Fatigue with walking that improves with rest.  Denied any cardiac symptoms specifically no palpitations chest pain or tightness at that time and denied fatigue at other times of the day.  Discussed cardiology eval unless other identifiable source on her labs.  However also had bradycardia on EKG which may have also been contributing.  Labs were overall reassuring with normal hemoglobin, reassuring electrolytes, normal thyroid . Fatigue better. No further fatigue with walking. Feeling back to normal self. Resolved next day after last visit - better with walking.  No CP/dyspnea/weakness, HA.   Needs refill of flonase  nasal spray.  Results for orders placed or performed in visit on 10/04/24  CBC   Collection Time: 10/04/24  9:28 AM  Result Value Ref Range   WBC 3.9 (L) 4.0 - 10.5 K/uL   RBC 4.43 3.87 - 5.11 Mil/uL   Platelets 181.0 150.0 - 400.0 K/uL   Hemoglobin 13.9 12.0 - 15.0 g/dL   HCT 57.4 63.9 - 53.9 %   MCV 95.8 78.0 - 100.0 fl   MCHC 32.7 30.0 - 36.0 g/dL   RDW 85.8 88.4 - 84.4 %  Comprehensive metabolic panel with GFR   Collection Time: 10/04/24  9:28 AM  Result Value Ref Range   Sodium 142 135 - 145 mEq/L   Potassium 4.5 3.5 - 5.1 mEq/L   Chloride 104 96 - 112 mEq/L   CO2 31 19 - 32 mEq/L   Glucose, Bld 92 70 - 99 mg/dL   BUN 11 6 - 23 mg/dL   Creatinine, Ser 9.18 0.40 - 1.20 mg/dL   Total Bilirubin 0.4 0.2 - 1.2 mg/dL   Alkaline Phosphatase 77 39 - 117 U/L   AST 20 0 - 37 U/L   ALT 18 0 - 35 U/L   Total Protein 7.7 6.0 - 8.3 g/dL   Albumin 4.6 3.5 - 5.2 g/dL   GFR 26.46 >39.99 mL/min   Calcium  10.3 8.4 - 10.5 mg/dL   Hemoglobin J8r   Collection Time: 10/04/24  9:28 AM  Result Value Ref Range   Hgb A1c MFr Bld 6.0 4.6 - 6.5 %  TSH   Collection Time: 10/04/24  9:28 AM  Result Value Ref Range   TSH 3.12 0.35 - 5.50 uIU/mL  Lipid panel   Collection Time: 10/04/24  9:28 AM  Result Value Ref Range   Cholesterol 157 0 - 200 mg/dL   Triglycerides 46.9 0.0 - 149.0 mg/dL   HDL 12.69 >60.99 mg/dL   VLDL 89.3 0.0 - 59.9 mg/dL   LDL Cholesterol 59 0 - 99 mg/dL   Total CHOL/HDL Ratio 2    NonHDL 69.53      History Patient Active Problem List   Diagnosis Date Noted   History of colonic polyps 10/04/2024   Esophageal reflux 10/04/2024   Gastro-esophageal reflux disease with esophagitis 02/07/2022   Dyspepsia 02/07/2022   Epigastric pain 02/07/2022   Gastroesophageal reflux disease without esophagitis 02/07/2022   Other long term (current) drug therapy 02/07/2022   Hyperlipidemia 09/22/2021   Hypothyroidism 09/22/2021   CMC arthritis, thumb, degenerative 04/04/2016  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 08/10/2013   Trigger finger, acquired 04/22/2013   Carpal tunnel syndrome of right wrist 04/22/2013   Pain of right heel 12/08/2012   Myalgia 11/10/2012   OBESITY, UNSPECIFIED 02/20/2011   BACK PAIN, LUMBAR 07/05/2010   ABNORMALITY OF GAIT 05/02/2010   MERALGIA PARESTHETICA 03/02/2010   HIP PAIN, BILATERAL 03/02/2010   Past Medical History:  Diagnosis Date   Allergy    Arthritis    GERD (gastroesophageal reflux disease)    Glaucoma    Headache(784.0)    sinus   High cholesterol    History of Graves' disease    Hx of radioactive iodine thyroid  ablation    Hyperlipidemia    Phreesia 11/08/2020   Hypothyroidism    Sickle cell anemia (HCC)    Phreesia 11/08/2020   Past Surgical History:  Procedure Laterality Date   ABDOMINAL HYSTERECTOMY     APPENDECTOMY     BREAST BIOPSY Right 11/04/2022   MM RT BREAST BX W LOC DEV 1ST LESION IMAGE BX SPEC STEREO GUIDE 11/04/2022 [facility]    CATARACT EXTRACTION W/PHACO Right 09/22/2013   Procedure: CATARACT EXTRACTION PHACO AND INTRAOCULAR LENS PLACEMENT (IOC);  Surgeon: Jestine Bunnell, MD;  Location[facility];  Service: Ophthalmology;  Laterality: Right;   CATARACT EXTRACTION W/PHACO Left 12/29/2013   Procedure: CATARACT EXTRACTION PHACO AND INTRAOCULAR LENS PLACEMENT (IOC);  Surgeon: Jestine Bunnell, MD;  Location: [facility] OR;  Service: Ophthalmology;  Laterality: Left;   COLONOSCOPY     EYE SURGERY     HIP ARTHROPLASTY Bilateral    JOINT REPLACEMENT     OTHER SURGICAL HISTORY Right    carpal tunel injection   SHOULDER SURGERY     TONSILLECTOMY     Allergies  Allergen Reactions   Other    Meloxicam Other (See Comments)    Causes Bruising    Pravastatin Sodium Other (See Comments)    Bruises   Simvastatin Other (See Comments)    Bruises    Tramadol Other (See Comments)   Tramadol Hcl Other (See Comments)    Bruising    Tramadol Hcl Other (See Comments)   Prior to Admission medications   Medication Sig Start Date End Date Taking? Authorizing Provider  acetaminophen  (TYLENOL ) 650 MG CR tablet Take 650 mg by mouth every 8 (eight) hours as needed for pain.   Yes [provider]  Calcium  Carbonate (CALCIUM  600 PO) Take by mouth.   Yes [provider]  cetirizine (ZYRTEC) 10 MG tablet 1 tablet   Yes [provider]  Cholecalciferol (VITAMIN D ) 50 MCG (2000 UT) tablet Take 2,000 Units by mouth daily.   Yes [provider]  fluticasone  (FLONASE ) 50 MCG/ACT nasal spray SPRAY 2 SPRAYS INTO EACH NOSTRIL EVERY DAY 09/04/23  Yes Levora Reyes SAUNDERS, MD  gabapentin  (NEURONTIN ) 600 MG tablet Take 1 tablet (600 mg total) by mouth 2 (two) times daily as needed. 10/04/24  Yes Levora Reyes SAUNDERS, MD  ibuprofen  (ADVIL ,MOTRIN ) 600 MG tablet Take 1 tablet (600 mg total) by mouth every 8 (eight) hours as needed (with food). 10/21/17  Yes Levora Reyes SAUNDERS, MD  Multiple Vitamins-Minerals (CENTRUM SILVER PO) Take by  mouth.   Yes [provider]  Omega-3 Fatty Acids (FISH OIL) 1200 MG CAPS Take 1,200 mg by mouth 3 (three) times daily.    Yes [provider]  pantoprazole  (PROTONIX ) 40 MG tablet TAKE 1 TABLET BY MOUTH DAILY AS NEEDED 10/18/24  Yes Levora Reyes SAUNDERS, MD  RA Vitamin E-Vit A & D 20000 units CREA 1 tablet with a meal   Yes [provider]  rosuvastatin  (CRESTOR ) 5 MG tablet 1 tab every night at 6pm 10/04/24  Yes Levora Reyes SAUNDERS, MD  SYNTHROID  100 MCG tablet Take 1 tablet (100 mcg total) by mouth daily before breakfast. 10/04/24  Yes Levora Reyes SAUNDERS, MD  vitamin E 400 UNIT capsule Take 400 Units by mouth daily.   Yes [provider]  Misc Natural Products (CALCIUM  PLUS ADVANCED PO) Take 600 mg by mouth 2 (two) times daily.  Patient not taking: Reported on 11/11/2024    [provider]   Social History   Socioeconomic History   Marital status: Married    Spouse name: Not on file   Number of children: Not on file   Years of education: Not on file   Highest education level: 7th grade  Occupational History   Not on file  Tobacco Use   Smoking status: Never   Smokeless tobacco: Never  Vaping Use   Vaping status: Never Used  Substance and Sexual Activity   Alcohol use: No   Drug use: No   Sexual activity: Yes    Partners: Male    Birth control/protection: None  Other Topics Concern   Not on file  Social History Narrative   Married. Education: [employer].    Social Drivers of Corporate Investment Banker Strain: Low Risk  (10/03/2024)   Overall Financial Resource Strain (CARDIA)    Difficulty of Paying Living Expenses: Not hard at all  Food Insecurity: No Food Insecurity (10/03/2024)   Hunger Vital Sign    Worried About Running Out of Food in the Last Year: Never true    Ran Out of Food in the Last Year: Never true  Transportation Needs: No Transportation Needs (10/03/2024)   PRAPARE - Administrator, Civil Service  (Medical): No    Lack of Transportation (Non-Medical): No  Physical Activity: Sufficiently Active (10/03/2024)   Exercise Vital Sign    Days of Exercise per Week: 5 days    Minutes of Exercise per Session: 150+ min  Stress: No Stress Concern Present (10/03/2024)   [employer] of Occupational Health - Occupational Stress Questionnaire    Feeling of Stress: Not at all  Social Connections: Socially Integrated (10/03/2024)   Social Connection and Isolation Panel    Frequency of Communication with Friends and Family: More than three times a week    Frequency of Social Gatherings with Friends and Family: More than three times a week    Attends Religious Services: More than 4 times per year    Active Member of [employer] or Organizations: Yes    Attends Banker Meetings: 1 to 4 times per year    Marital Status: Married  Catering Manager Violence: Not At Risk (03/09/2024)   Humiliation, Afraid, Rape, and Kick questionnaire    Fear of Current or Ex-Partner: No    Emotionally Abused: No    Physically Abused: No    Sexually Abused: No   Review of Systems Per HPI.   Objective:   Vitals:   11/11/24 1058  BP: 124/68  Pulse: 63  Resp: 18  Temp: 98.3 F (36.8 C)  TempSrc: Temporal  SpO2: 96%  Weight: 224 lb 12.8 oz (102 kg)  Height: 5' 3.25 (1.607 m)     Physical Exam     Assessment & Plan:  Veronica Martinez is a 68 y.o.  female . Fatigue, unspecified type Improved.  Quickly resolved after her last visit.  Certainly could have been a component of bradycardia, reassuring labs.  Given resolution of symptoms, asymptomatic at this time, will continue to monitor but if symptoms recur would recommend eval with cardiology and possible Zio patch monitoring.  RTC/ER precautions given.  All questions answered.  Meds ordered this encounter  Medications   fluticasone  (FLONASE ) 50 MCG/ACT nasal spray    Sig: Place 2 sprays into both nostrils daily.    Dispense:  48 mL     Refill:  1   Patient Instructions  Glad to hear you are feeling better.  If any return of fatigue I would recommend meeting with cardiology to make sure that a low heart rate or other cardiac cause might be involved.  Heart rate looks okay today and with overall reassuring labs no further workup at this time.  Keep me posted.  Follow-up in 5 months.  Happy to see you sooner if needed.  Take care!    Signed,   Reyes Pines, MD [facility], [facility] 11/11/24 11:34 AM

## 2024-11-11 NOTE — Patient Instructions (Addendum)
 Glad to hear you are feeling better.  If any return of fatigue I would recommend meeting with cardiology to make sure that a low heart rate or other cardiac cause might be involved.  Heart rate looks okay today and with overall reassuring labs no further workup at this time.  Keep me posted.  Follow-up in 5 months.  Happy to see you sooner if needed.  Take care!

## 2024-11-15 DIAGNOSIS — H35372 Puckering of macula, left eye: Secondary | ICD-10-CM | POA: Diagnosis not present

## 2024-11-15 DIAGNOSIS — H10413 Chronic giant papillary conjunctivitis, bilateral: Secondary | ICD-10-CM | POA: Diagnosis not present

## 2024-11-15 DIAGNOSIS — H0102A Squamous blepharitis right eye, upper and lower eyelids: Secondary | ICD-10-CM | POA: Diagnosis not present

## 2024-11-15 DIAGNOSIS — H26491 Other secondary cataract, right eye: Secondary | ICD-10-CM | POA: Diagnosis not present

## 2024-11-15 DIAGNOSIS — H0102B Squamous blepharitis left eye, upper and lower eyelids: Secondary | ICD-10-CM | POA: Diagnosis not present

## 2024-11-15 DIAGNOSIS — H0264 Xanthelasma of left upper eyelid: Secondary | ICD-10-CM | POA: Diagnosis not present

## 2024-11-15 DIAGNOSIS — H04123 Dry eye syndrome of bilateral lacrimal glands: Secondary | ICD-10-CM | POA: Diagnosis not present

## 2024-11-15 DIAGNOSIS — Z961 Presence of intraocular lens: Secondary | ICD-10-CM | POA: Diagnosis not present

## 2024-11-15 DIAGNOSIS — H43813 Vitreous degeneration, bilateral: Secondary | ICD-10-CM | POA: Diagnosis not present

## 2024-11-26 ENCOUNTER — Other Ambulatory Visit: Payer: Self-pay | Admitting: Family Medicine

## 2024-11-26 DIAGNOSIS — Z1231 Encounter for screening mammogram for malignant neoplasm of breast: Secondary | ICD-10-CM

## 2024-12-10 ENCOUNTER — Inpatient Hospital Stay: Admission: RE | Admit: 2024-12-10 | Discharge: 2024-12-10 | Attending: Family Medicine | Admitting: Family Medicine

## 2024-12-10 DIAGNOSIS — Z1231 Encounter for screening mammogram for malignant neoplasm of breast: Secondary | ICD-10-CM

## 2024-12-17 ENCOUNTER — Other Ambulatory Visit: Payer: Self-pay | Admitting: Family Medicine

## 2024-12-17 DIAGNOSIS — R928 Other abnormal and inconclusive findings on diagnostic imaging of breast: Secondary | ICD-10-CM

## 2024-12-29 ENCOUNTER — Ambulatory Visit
Admission: RE | Admit: 2024-12-29 | Discharge: 2024-12-29 | Disposition: A | Discharge status: Home / Self Care | Source: Ambulatory Visit | Attending: Family Medicine | Admitting: Family Medicine

## 2024-12-29 ENCOUNTER — Other Ambulatory Visit: Payer: Self-pay | Admitting: Family Medicine

## 2024-12-29 DIAGNOSIS — R928 Other abnormal and inconclusive findings on diagnostic imaging of breast: Secondary | ICD-10-CM

## 2024-12-29 DIAGNOSIS — N6322 Unspecified lump in the left breast, upper inner quadrant: Secondary | ICD-10-CM

## 2025-03-15 ENCOUNTER — Encounter

## 2025-04-11 ENCOUNTER — Ambulatory Visit: Admitting: Family Medicine

## 2025-06-30 ENCOUNTER — Other Ambulatory Visit
